# Patient Record
Sex: Female | Born: 1975
Health system: Southern US, Community
[De-identification: ages and names within clinical notes are randomized; demographics above are authoritative.]

## PROBLEM LIST (undated history)

## (undated) DIAGNOSIS — I471 Supraventricular tachycardia, unspecified: Secondary | ICD-10-CM

## (undated) DIAGNOSIS — T7840XA Allergy, unspecified, initial encounter: Secondary | ICD-10-CM

## (undated) DIAGNOSIS — J302 Other seasonal allergic rhinitis: Secondary | ICD-10-CM

## (undated) DIAGNOSIS — E782 Mixed hyperlipidemia: Secondary | ICD-10-CM

## (undated) DIAGNOSIS — I10 Essential (primary) hypertension: Secondary | ICD-10-CM

## (undated) DIAGNOSIS — D649 Anemia, unspecified: Secondary | ICD-10-CM

## (undated) DIAGNOSIS — L299 Pruritus, unspecified: Secondary | ICD-10-CM

## (undated) DIAGNOSIS — F329 Major depressive disorder, single episode, unspecified: Secondary | ICD-10-CM

## (undated) DIAGNOSIS — J45909 Unspecified asthma, uncomplicated: Secondary | ICD-10-CM

## (undated) DIAGNOSIS — O9989 Other specified diseases and conditions complicating pregnancy, childbirth and the puerperium: Secondary | ICD-10-CM

## (undated) DIAGNOSIS — E079 Disorder of thyroid, unspecified: Secondary | ICD-10-CM

## (undated) DIAGNOSIS — Z124 Encounter for screening for malignant neoplasm of cervix: Secondary | ICD-10-CM

## (undated) DIAGNOSIS — M549 Dorsalgia, unspecified: Secondary | ICD-10-CM

## (undated) DIAGNOSIS — Z803 Family history of malignant neoplasm of breast: Secondary | ICD-10-CM

## (undated) HISTORY — DX: Unspecified asthma, uncomplicated: J45.909

## (undated) HISTORY — DX: Pruritus, unspecified: L29.9

## (undated) HISTORY — DX: Family history of malignant neoplasm of breast: Z80.3

## (undated) HISTORY — DX: Allergy, unspecified, initial encounter: T78.40XA

## (undated) HISTORY — DX: Supraventricular tachycardia: I47.1

## (undated) HISTORY — DX: Encounter for screening for malignant neoplasm of cervix: Z12.4

## (undated) HISTORY — DX: Mixed hyperlipidemia: E78.2

## (undated) HISTORY — PX: ABLATION: SHX5711

## (undated) HISTORY — DX: Other specified diseases and conditions complicating pregnancy, childbirth and the puerperium: O99.89

## (undated) HISTORY — DX: Disorder of thyroid, unspecified: E07.9

## (undated) HISTORY — DX: Anemia, unspecified: D64.9

## (undated) HISTORY — DX: Dorsalgia, unspecified: M54.9

## (undated) HISTORY — DX: Supraventricular tachycardia, unspecified: I47.10

---
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
## 2011-11-22 ENCOUNTER — Encounter (HOSPITAL_COMMUNITY): Payer: Self-pay | Admitting: Physical Medicine and Rehabilitation

## 2011-11-22 ENCOUNTER — Emergency Department (HOSPITAL_COMMUNITY)
Admission: EM | Admit: 2011-11-22 | Discharge: 2011-11-22 | Disposition: A | Discharge status: Home / Self Care | Payer: BC Managed Care – PPO | Attending: Emergency Medicine | Admitting: Emergency Medicine

## 2011-11-22 DIAGNOSIS — I471 Supraventricular tachycardia, unspecified: Secondary | ICD-10-CM | POA: Insufficient documentation

## 2011-11-22 HISTORY — DX: Other seasonal allergic rhinitis: J30.2

## 2011-11-22 MED ORDER — DIPHENHYDRAMINE HCL 50 MG/ML IJ SOLN
25.0000 mg | Freq: Once | INTRAMUSCULAR | Status: AC
  Administered 2011-11-22: 25 mg via INTRAVENOUS
  Filled 2011-11-22: qty 1

## 2011-11-22 MED ORDER — ADENOSINE 6 MG/2ML IV SOLN
6.0000 mg | Freq: Once | INTRAVENOUS | Status: AC
  Administered 2011-11-22: 6 mg via INTRAVENOUS
  Filled 2011-11-22: qty 6

## 2011-11-22 MED ORDER — METHYLPREDNISOLONE SODIUM SUCC 125 MG IJ SOLR
125.0000 mg | Freq: Once | INTRAMUSCULAR | Status: AC
  Administered 2011-11-22: 125 mg via INTRAVENOUS
  Filled 2011-11-22: qty 2

## 2011-11-22 NOTE — ED Notes (Signed)
Pt presents to department for evaluation of palpitations and rapid HR. Onset last night. Denies chest pain, states generalized fatigue. Pt states "I feel like my heart is going to jump out of my chest." respirations unlabored. She is conscious alert and oriented x4.

## 2011-11-22 NOTE — ED Provider Notes (Signed)
Medical screening examination/treatment/procedure(s) were conducted as a shared visit with non-physician practitioner(s) and myself.  I personally evaluated the patient during the encounter Pt is a 36 year old woman who presented with narrow complex tachycardia.  She was treated with IV adenosine with resolution or her dysrhythmia.  Carleene Cooper III, MD 11/22/11 [phone]

## 2011-11-22 NOTE — ED Provider Notes (Signed)
7:36 AM  Date: 11/22/2011  Rate: 170  Rhythm: supraventricular tachycardia (SVT)  QRS Axis: normal  Intervals: normal  ST/T Wave abnormalities: nonspecific ST/T changes  Conduction Disutrbances:none  Narrative Interpretation: Abnormal EKG  Old EKG Reviewed: none available      Carleene Cooper III, MD 11/22/11 [phone]

## 2011-11-22 NOTE — ED Provider Notes (Signed)
History     CSN: [id number]  Arrival date & time 11/22/11  0710   First MD Initiated Contact with Patient 11/22/11 0720      Chief Complaint  Patient presents with  . Tachycardia    (Consider location/radiation/quality/duration/timing/severity/associated sxs/prior treatment) HPI  36 year old female with history of tachycardia presents complaining of rapid heart rate.  Patient acknowledge that she has history of supraventricular tachycardia. She had similar symptoms of rapid heart rate in the past requiring adenosine.  She reports essentially she felt that her heart is a much faster than usual. Denies any chest pain, lightheadedness, dizziness, or shortness of breath. Denies any precipitating factors. States that whenever she is stressed out or drink coffee rate is usually fasting normal. She normally takes the blood pressure medication which usually alleviate her symptoms.  It did not work this time.  Pt currently has no other complaints.  Past Medical History  Diagnosis Date  . Tachycardia   . Seasonal allergies     No past surgical history on file.  No family history on file.  History  Substance Use Topics  . Smoking status: Never Smoker   . Smokeless tobacco: Not on file  . Alcohol Use: No    OB History    Grav Para Term Preterm Abortions TAB SAB Ect Mult Living                  Review of Systems  All other systems reviewed and are negative.    Allergies  Amoxicillin and Penicillins  Home Medications  No current outpatient prescriptions on file.  BP 97/69  Pulse 170  Temp 97.8 F (36.6 C) (Oral)  Resp 18  SpO2 100%  Physical Exam  Nursing note and vitals reviewed. Constitutional: She is oriented to person, place, and time. She appears well-developed and well-nourished. No distress.       Awake, alert, nontoxic appearance  HENT:  Head: Atraumatic.  Eyes: Conjunctivae normal are normal. Right eye exhibits no discharge. Left eye exhibits no discharge.   Neck: Normal range of motion. Neck supple.  Cardiovascular: Regular rhythm and intact distal pulses.  Exam reveals no gallop and no friction rub.   No murmur heard.      Tachycardia noted  Pulmonary/Chest: Effort normal. No respiratory distress. She exhibits no tenderness.  Abdominal: Soft. There is no tenderness. There is no rebound.  Musculoskeletal: She exhibits no edema and no tenderness.       ROM appears intact, no obvious focal weakness  Neurological: She is alert and oriented to person, place, and time. GCS eye subscore is 4. GCS verbal subscore is 5. GCS motor subscore is 6.       Mental status and motor strength appears intact  Skin: No rash noted.  Psychiatric: She has a normal mood and affect.    ED Course  CARDIOVERSION Date/Time: 11/22/2011 8:05 AM Performed by: Fayrene Helper Authorized by: Fayrene Helper Consent: Verbal consent obtained. Risks and benefits: risks, benefits and alternatives were discussed Consent given by: patient Patient understanding: patient states understanding of the procedure being performed Patient consent: the patient's understanding of the procedure matches consent given Procedure consent: procedure consent matches procedure scheduled Patient identity confirmed: verbally with patient and arm band Time out: Immediately prior to procedure a "time out" was called to verify the correct patient, procedure, equipment, support staff and site/side marked as required. Patient sedated: no Cardioversion basis: emergent Pre-procedure rhythm: supraventricular tachycardia Patient position: patient was placed in a supine  position Chest area: chest area exposed Electrodes: pads Electrodes placed: anterior-posterior Number of attempts: 1 Post-procedure rhythm: normal sinus rhythm Complications: no complications Patient tolerance: Patient tolerated the procedure well with no immediate complications (cardioverted with adenosine 6mg  IV).   (including critical  care time)  Labs Reviewed - No data to display No results found.   No diagnosis found.  7:36 AM  Date: 11/22/2011  Rate: 170  Rhythm: supraventricular tachycardia (SVT)  QRS Axis: normal  Intervals: normal  ST/T Wave abnormalities: nonspecific ST/T changes  Conduction Disutrbances:none  Narrative Interpretation: Abnormal EKG  Old EKG Reviewed: none available   8:08 AM  Date: 11/22/2011  Rate: 102  Rhythm: sinus tachycardia  QRS Axis: normal  Intervals: normal  ST/T Wave abnormalities: normal  Conduction Disutrbances:none  Narrative Interpretation: Essentially normal EKG  Old EKG Reviewed: changes noted--Was in PSVT with rate of 170 on prior tracing.     MDM  Pt has supraventricular tachycardia with HR in 170s. Will give adenosine as treatment.  Pt report she is allergic to saline solution, with hive and rash.  I will premedicate with solumedrol 125mg , and Benadryl 25mg  via IV prior to administering the medication.  Pt aware and agrees with plan.  My attending has seen and evaluate pt.     8:08 AM Pt was successfully cardioverted with adenosine 6mg  IV.  Pt subjectively felt better.  WIll monitor for the next hour.  Pt in supine position.  A&Ox3   9:51 AM Pt has been observed for several hrs and shows no recurrence of SVT or side effect from normal saline.  She is A&Ox3.  She is ready to be d/c.  Family member at bedside.  Pt agrees to f/u with her PCP for further care.  Strict return precaution discussed.    BP 108/68  Pulse 79  Temp 97.8 F (36.6 C) (Oral)  Resp 18  SpO2 100%  Nursing notes reviewed and considered in documentation  Previous records reviewed and considered   CRITICAL CARE Performed by: Kriston Pasquarello   Total critical care time: 30 min  Critical care time was exclusive of separately billable procedures and treating other patients.  Critical care was necessary to treat or prevent imminent or life-threatening deterioration.  Critical care was  time spent personally by me on the following activities: development of treatment plan with patient and/or surrogate as well as nursing, discussions with consultants, evaluation of patient's response to treatment, examination of patient, obtaining history from patient or surrogate, ordering and performing treatments and interventions, ordering and review of laboratory studies, ordering and review of radiographic studies, pulse oximetry and re-evaluation of patient's condition.     Fayrene Helper, PA-C 11/22/11 [phone]

## 2011-11-22 NOTE — Progress Notes (Signed)
8:08 AM  Date: 11/22/2011  Rate: 102  Rhythm: sinus tachycardia  QRS Axis: normal  Intervals: normal  ST/T Wave abnormalities: normal  Conduction Disutrbances:none  Narrative Interpretation: Essentially normal EKG  Old EKG Reviewed: changes noted--Was in PSVT with rate of 170 on prior tracing.

## 2012-11-25 ENCOUNTER — Encounter (HOSPITAL_COMMUNITY): Payer: Self-pay | Admitting: Emergency Medicine

## 2012-11-25 ENCOUNTER — Emergency Department (HOSPITAL_COMMUNITY)
Admission: EM | Admit: 2012-11-25 | Discharge: 2012-11-26 | Disposition: A | Discharge status: Home / Self Care | Payer: BC Managed Care – PPO | Attending: Emergency Medicine | Admitting: Emergency Medicine

## 2012-11-25 DIAGNOSIS — Z79899 Other long term (current) drug therapy: Secondary | ICD-10-CM | POA: Insufficient documentation

## 2012-11-25 DIAGNOSIS — I498 Other specified cardiac arrhythmias: Secondary | ICD-10-CM | POA: Insufficient documentation

## 2012-11-25 DIAGNOSIS — I471 Supraventricular tachycardia: Secondary | ICD-10-CM

## 2012-11-25 DIAGNOSIS — Z8709 Personal history of other diseases of the respiratory system: Secondary | ICD-10-CM | POA: Insufficient documentation

## 2012-11-25 DIAGNOSIS — Z880 Allergy status to penicillin: Secondary | ICD-10-CM | POA: Insufficient documentation

## 2012-11-25 MED ORDER — ADENOSINE 6 MG/2ML IV SOLN
6.0000 mg | Freq: Once | INTRAVENOUS | Status: AC
  Administered 2012-11-26: 6 mg via INTRAVENOUS
  Filled 2012-11-25: qty 2

## 2012-11-25 MED ORDER — ADENOSINE 6 MG/2ML IV SOLN
12.0000 mg | Freq: Once | INTRAVENOUS | Status: AC
  Administered 2012-11-26: 12 mg via INTRAVENOUS
  Filled 2012-11-25: qty 4

## 2012-11-25 MED ORDER — DIPHENHYDRAMINE HCL 50 MG/ML IJ SOLN
12.5000 mg | Freq: Once | INTRAMUSCULAR | Status: AC
  Administered 2012-11-26: 12.5 mg via INTRAVENOUS
  Filled 2012-11-25: qty 1

## 2012-11-25 MED ORDER — METHYLPREDNISOLONE SODIUM SUCC 125 MG IJ SOLR
125.0000 mg | Freq: Once | INTRAMUSCULAR | Status: AC
  Administered 2012-11-26: 125 mg via INTRAVENOUS
  Filled 2012-11-25: qty 2

## 2012-11-25 NOTE — ED Notes (Signed)
The pt woke up at 2030 with her heart beating fast.  She has a history of the same. .  Minor chest pain and throat pain with the heart rate

## 2012-11-25 NOTE — ED Notes (Signed)
Valsalva  Maneuver not helpful

## 2012-11-26 MED ORDER — PREDNISONE 20 MG PO TABS: 40.0000 mg | ORAL_TABLET | Freq: Every day | ORAL | Status: DC

## 2012-11-26 NOTE — ED Provider Notes (Addendum)
CSN: [id number]     Arrival date & time 11/25/12  2340 History   First MD Initiated Contact with Patient 11/25/12 2347     Chief Complaint  Patient presents with  . Tachycardia   (Consider location/radiation/quality/duration/timing/severity/associated sxs/prior Treatment) HPI 37 yo female presents to the ER from home with complaint of fast heart rate.  She reports waking around 830 pm tonight with palpitations.  Pt has h/o SVT since age 4.  Last ED visit about a year ago.  She reports mild chest pressure and throat pain due to elevated heart rate.  No other complaints.  She is requesting further information about treatments for SVT, as episodes are getting closer together, last cardiology evaluation about 15 years ago.  Past Medical History  Diagnosis Date  . Tachycardia   . Seasonal allergies    History reviewed. No pertinent past surgical history. No family history on file. History  Substance Use Topics  . Smoking status: Never Smoker   . Smokeless tobacco: Not on file  . Alcohol Use: No   OB History   Grav Para Term Preterm Abortions TAB SAB Ect Mult Living                 Review of Systems  All other systems reviewed and are negative.    Allergies  Amoxicillin and Penicillins  Home Medications   Current Outpatient Rx  Name  Route  Sig  Dispense  Refill  . atenolol (TENORMIN) 50 MG tablet   Oral   Take 50 mg by mouth daily.          BP 119/91  Temp(Src) 98.2 F (36.8 C) (Oral)  Resp 16  SpO2 100%  LMP 11/06/2012 Physical Exam  Constitutional: She is oriented to person, place, and time. She appears well-developed and well-nourished. She appears distressed.  HENT:  Head: Normocephalic and atraumatic.  Nose: Nose normal.  Mouth/Throat: Oropharynx is clear and moist.  Eyes: Conjunctivae and EOM are normal. Pupils are equal, round, and reactive to light.  Neck: Normal range of motion. Neck supple. No JVD present. No tracheal deviation present. No  thyromegaly present.  Cardiovascular: Regular rhythm, normal heart sounds and intact distal pulses.  Exam reveals no gallop and no friction rub.   No murmur heard. Tachycardia noted  Pulmonary/Chest: Effort normal and breath sounds normal. No stridor. No respiratory distress. She has no wheezes. She has no rales. She exhibits no tenderness.  Abdominal: Soft. Bowel sounds are normal. She exhibits no distension and no mass. There is no tenderness. There is no rebound and no guarding.  Musculoskeletal: Normal range of motion. She exhibits no edema and no tenderness.  Lymphadenopathy:    She has no cervical adenopathy.  Neurological: She is alert and oriented to person, place, and time. She exhibits normal muscle tone. Coordination normal.  Skin: Skin is warm and dry. No rash noted. No erythema. There is pallor.  Psychiatric: She has a normal mood and affect. Her behavior is normal. Judgment and thought content normal.    ED Course  CARDIOVERSION Date/Time: 11/26/2012 12:22 AM Performed by: Olivia Mackie Authorized by: Olivia Mackie Consent: Verbal consent obtained. Risks and benefits: risks, benefits and alternatives were discussed Consent given by: patient Required items: required blood products, implants, devices, and special equipment available Time out: Immediately prior to procedure a "time out" was called to verify the correct patient, procedure, equipment, support staff and site/side marked as required. Patient sedated: no Cardioversion basis: emergent Pre-procedure rhythm:  supraventricular tachycardia Patient position: patient was placed in a supine position Electrodes: pads Electrodes placed: anterior-posterior Number of attempts: 2 Post-procedure rhythm: normal sinus rhythm Complications: no complications Comments: Pt was given adenosine 6 mg with no change in SVT.  Pt then received 12 mg adenosine with conversion to sinus tachycardia, then sinus rhythm.  Given history of  allergic reaction to saline, will observe closely for one hour.   (including critical care time) Labs Review Labs Reviewed - No data to display Imaging Review No results found. Pre conversion  Date: 11/25/2012  Rate: 176  Rhythm: supraventricular tachycardia (SVT)  QRS Axis: normal  Intervals: svt  ST/T Wave abnormalities: nonspecific ST/T changes  Conduction Disutrbances:none  Narrative Interpretation:   Old EKG Reviewed: changes noted  Post conversion  EKG Interpretation     Ventricular Rate:  100 PR Interval:  132 QRS Duration: 80 QT Interval:  339 QTC Calculation: 438 R Axis:   84 Text Interpretation:  Sinus tachycardia Borderline T abnormalities, anterior leads             MDM   1. SVT (supraventricular tachycardia)    37 yo female with h/o SVT.  Conversion with 12 mg of adenosine.  Will observe for allergic reaction to saline.    Olivia Mackie, MD 11/26/12 0034  1:17 AM Pt is feeling better.  No signs of allergic reaction.  Will refer to EP for further w/u of her svt.  Olivia Mackie, MD 11/26/12 [phone]

## 2012-11-27 ENCOUNTER — Encounter: Payer: Self-pay | Admitting: Cardiology

## 2012-11-27 ENCOUNTER — Ambulatory Visit (INDEPENDENT_AMBULATORY_CARE_PROVIDER_SITE_OTHER): Payer: BC Managed Care – PPO | Admitting: Cardiology

## 2012-11-27 VITALS — BP 114/64 | HR 68 | Ht 61.0 in | Wt 115.0 lb

## 2012-11-27 DIAGNOSIS — I498 Other specified cardiac arrhythmias: Secondary | ICD-10-CM

## 2012-11-27 DIAGNOSIS — I471 Supraventricular tachycardia: Secondary | ICD-10-CM | POA: Insufficient documentation

## 2012-11-27 MED ORDER — ATENOLOL 50 MG PO TABS: 75.0000 mg | ORAL_TABLET | Freq: Every day | ORAL | Status: DC

## 2012-11-27 NOTE — Progress Notes (Signed)
April Jackson Date of Birth: 1975/06/04 Medical Record #[id number]  History of Present Illness: April Jackson is seen for evaluation of recurrent supraventricular tachycardia. 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 She reports that she has had supraventricular tachycardia since age 2. This has been managed very well with atenolol 50 mg daily. In the past her heart rate is typically around 180 beats per minute with her tachycardia. She thinks that her symptoms are more frequent. The symptoms she describes are a sensation that her tachycardia is going to start but then doesn't. This only lasts for 2-3 seconds. She is actually had her tachycardia fairly infrequently. One year ago in October she had an episode of SVT that required adenosine to convert. She had another episode 1 week ago. Prior to these episodes it had been 8 years before she  had a sustained episode. She thinks that sometimes she can stop her tachycardia with Valsalva maneuver. She avoids caffeine. He thinks that stress and seasonal allergies are possible triggers. She does not use decongestants but does use antihistamines. When she has her tachycardia she complains of chest pain, shortness of breath, and a sensation that her throat is large. She sometimes feels a little bit faint but has never had syncope. She reports a normal echocardiogram 5 years ago. She had complete lab work with her physical earlier this year. Her primary care physician is in [place].  Current Outpatient Prescriptions on File Prior to Visit  Medication Sig Dispense Refill  . predniSONE (DELTASONE) 20 MG tablet Take 2 tablets (40 mg total) by mouth daily.  10 tablet  0   No current facility-administered medications on file prior to visit.    Allergies  Allergen Reactions  . Amoxicillin Other ([facility] Comments)    thrush  . Penicillins Other ([facility] Comments)    thrush    Past Medical History  Diagnosis Date  . SVT (supraventricular tachycardia)   . Seasonal allergies    . Thyroid disease     History reviewed. No pertinent past surgical history.  History  Smoking status  . Never Smoker   Smokeless tobacco  . Not on file    History  Alcohol Use No    Family History  Problem Relation Age of Onset  . Heart disease Father     Review of Systems: As noted in history of present illness.  All other systems were reviewed and are negative.  Physical Exam: BP 114/64  Pulse 68  Ht 5\' 1"  (1.549 m)  Wt 115 lb (52.164 kg)  BMI 21.74 kg/m2  LMP 11/06/2012 She is a pleasant,thin April Jackson female in no acute distress. HEENT: Normocephalic, atraumatic. Pupils equal round and reactive to light. Sclera are clear. Oropharynx is clear. Neck is without JVD, adenopathy, thyromegaly, or bruits. Lungs: Clear Cardiovascular: Regular rate and rhythm. Normal S1 and S2. No gallop, murmur, or click. Abdomen: Soft and nontender. No masses or hepatosplenomegaly. Extremities: No cyanosis or edema. Pulses are 2+. Skin: Warm and dry Neuro: Alert and oriented x3. LABORATORY DATA: ECG reviewed from 11/25/2012. This shows a supraventricular tachycardia with a rate of 176 beats per minute. There is nonspecific ST-T wave abnormality. No P waves are visible. I compared this with ECG and October 2013 there was no significant change. Postconversion ECG shows normal sinus rhythm with a normal ECG. There is no preexcitation.  Assessment / Plan: 1. Supraventricular tachycardia. 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 She has actually done quite  well on beta blocker therapy. Her episodes of sustained tachycardia have been fairly infrequent. I do not feel that further testing is required at this time. We discussed therapeutic options. At this point she would like to increase her beta blocker dose and we will increase her atenolol to 75 mg daily. We discussed possibly switching to a calcium channel blocker but she has done very well on beta  blocker therapy so I [place] no reason to change. At her young age I would not recommend antiarrhythmic drug therapy. We discussed the option of radiofrequency ablation. We discussed the procedure, risks, and outcomes. At this point, she does not want to pursue ablative therapy but I think would be open to this option if she has more frequent episodes of tachycardia. I will followup again in one year.

## 2013-09-12 LAB — HM PAP SMEAR: HM Pap smear: NORMAL

## 2013-09-28 LAB — HM MAMMOGRAPHY: HM Mammogram: NORMAL

## 2013-11-14 ENCOUNTER — Ambulatory Visit (INDEPENDENT_AMBULATORY_CARE_PROVIDER_SITE_OTHER): Payer: BC Managed Care – PPO | Admitting: Cardiology

## 2013-11-14 ENCOUNTER — Encounter: Payer: Self-pay | Admitting: Cardiology

## 2013-11-14 VITALS — BP 126/74 | HR 57 | Ht 61.0 in | Wt 125.0 lb

## 2013-11-14 DIAGNOSIS — I471 Supraventricular tachycardia: Secondary | ICD-10-CM

## 2013-11-14 MED ORDER — ATENOLOL 50 MG PO TABS: 75.0000 mg | ORAL_TABLET | Freq: Every day | ORAL | Status: DC

## 2013-11-14 NOTE — Patient Instructions (Signed)
Continue your current therapy  I will Melone you in one year   

## 2013-11-15 NOTE — Progress Notes (Signed)
   April Jackson Date of Birth: Mar 19, 1975 Medical Record #[id number]  History of Present Illness: Ms. Toutant is seen for follow up supraventricular tachycardia. She reports that she has had supraventricular tachycardia since age 38. This has been managed very well with atenolol. In the past her heart rate is typically around 180 beats per minute with her tachycardia.  has never had syncope. She reports a normal echocardiogram 5 years ago.On her last visit she was having more frequent symptoms so we increased her atenolol to 75 mg daily with good control. She states now she can tell when it is about to start and she takes an extra dose of beta blocker and it doesn't get started. She does this 1-2 x/month. She does note some fatigue on beta blocker and a little SOB.  Current Outpatient Prescriptions on File Prior to Visit  Medication Sig Dispense Refill  . Loratadine (CLARITIN) 10 MG CAPS Take 1 capsule by mouth daily.      . Multiple Vitamin (MULTI VITAMIN DAILY PO) Take 1 tablet by mouth daily.       No current facility-administered medications on file prior to visit.    Allergies  Allergen Reactions  . Amoxicillin Other ([facility] Comments)    thrush  . Penicillin V Potassium     Other reaction(s): Unknown  . Penicillins Other ([facility] Comments)    thrush    Past Medical History  Diagnosis Date  . SVT (supraventricular tachycardia)   . Seasonal allergies   . Thyroid disease     History reviewed. No pertinent past surgical history.  History  Smoking status  . Never Smoker   Smokeless tobacco  . Not on file    History  Alcohol Use No    Family History  Problem Relation Age of Onset  . Heart disease Father     Review of Systems: As noted in history of present illness.  All other systems were reviewed and are negative.  Physical Exam: BP 126/74  Pulse 57  Ht 5' 1"  (1.549 m)  Wt 125 lb (56.7 kg)  BMI 23.63 kg/m2 She is a pleasant,thin [place] female in no acute  distress. HEENT: Normocephalic, atraumatic. Pupils equal round and reactive to light. Sclera are clear. Oropharynx is clear. Neck is without JVD, adenopathy, thyromegaly, or bruits. Lungs: Clear Cardiovascular: Regular rate and rhythm. Normal S1 and S2. No gallop, murmur, or click. Abdomen: Soft and nontender. No masses or hepatosplenomegaly. Extremities: No cyanosis or edema. Pulses are 2+. Skin: Warm and dry Neuro: Alert and oriented x3.  LABORATORY DATA: Ecg: NSR normal   Assessment / Plan: 1. Supraventricular tachycardia. 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 She has actually done quite well on beta blocker therapy. She does have some side effects from beta blocker therapy. She states this is tolerable. She does not want to consider changing to a calcium channel blocker or considering ablation at this time. We have refilled her Rx and will follow up in one year.

## 2014-09-17 ENCOUNTER — Telehealth: Payer: Self-pay | Admitting: *Deleted

## 2014-09-17 ENCOUNTER — Encounter: Payer: Self-pay | Admitting: *Deleted

## 2014-09-17 DIAGNOSIS — Z1239 Encounter for other screening for malignant neoplasm of breast: Secondary | ICD-10-CM

## 2014-09-17 NOTE — Telephone Encounter (Signed)
Pre-Visit Call completed with patient and chart updated.   Pre-Visit Info documented in Specialty Comments under [employer].    

## 2014-09-17 NOTE — Addendum Note (Signed)
Addended by: Leticia Penna A on: 09/17/2014 02:11 PM   Modules accepted: Orders, Medications

## 2014-09-17 NOTE — Telephone Encounter (Signed)
Unable to reach patient at time of Pre-Visit Call.  Left message for patient to return call when available.    

## 2014-09-18 ENCOUNTER — Ambulatory Visit (INDEPENDENT_AMBULATORY_CARE_PROVIDER_SITE_OTHER): Payer: BLUE CROSS/BLUE SHIELD | Admitting: Family Medicine

## 2014-09-18 ENCOUNTER — Encounter: Payer: Self-pay | Admitting: Family Medicine

## 2014-09-18 VITALS — BP 98/68 | HR 73 | Temp 98.6°F | Ht 61.0 in | Wt 132.2 lb

## 2014-09-18 DIAGNOSIS — Z803 Family history of malignant neoplasm of breast: Secondary | ICD-10-CM

## 2014-09-18 DIAGNOSIS — Z1239 Encounter for other screening for malignant neoplasm of breast: Secondary | ICD-10-CM

## 2014-09-18 DIAGNOSIS — L299 Pruritus, unspecified: Secondary | ICD-10-CM | POA: Diagnosis not present

## 2014-09-18 DIAGNOSIS — Z0000 Encounter for general adult medical examination without abnormal findings: Secondary | ICD-10-CM

## 2014-09-18 DIAGNOSIS — E782 Mixed hyperlipidemia: Secondary | ICD-10-CM

## 2014-09-18 DIAGNOSIS — E079 Disorder of thyroid, unspecified: Secondary | ICD-10-CM | POA: Insufficient documentation

## 2014-09-18 DIAGNOSIS — T7840XA Allergy, unspecified, initial encounter: Secondary | ICD-10-CM | POA: Insufficient documentation

## 2014-09-18 DIAGNOSIS — M5489 Other dorsalgia: Secondary | ICD-10-CM

## 2014-09-18 DIAGNOSIS — I471 Supraventricular tachycardia: Secondary | ICD-10-CM

## 2014-09-18 MED ORDER — TRIAMCINOLONE ACETONIDE 0.1 % EX CREA: 1.0000 "application " | TOPICAL_CREAM | Freq: Two times a day (BID) | CUTANEOUS | Status: DC

## 2014-09-18 NOTE — Progress Notes (Signed)
Pre visit review using our clinic review tool, if applicable. No additional management support is needed unless otherwise documented below in the visit note. 

## 2014-09-18 NOTE — Addendum Note (Signed)
Addended by: Leticia Penna A on: 09/18/2014 02:43 PM   Modules accepted: Orders

## 2014-09-18 NOTE — Patient Instructions (Addendum)
[place] Astringent,  Zyrtec 2.5-10 mg  Antihistamines (1) Zantac 150 mg daily  Antihistamines (2)  Preventive Care for Adults A healthy lifestyle and preventive care can promote health and wellness. Preventive health guidelines for women include the following key practices.  A routine yearly physical is a good way to check with your health care provider about your health and preventive screening. It is a chance to share any concerns and updates on your health and to receive a thorough exam.  Visit your dentist for a routine exam and preventive care every 6 months. Brush your teeth twice a day and floss once a day. Good oral hygiene prevents tooth decay and gum disease.  The frequency of eye exams is based on your age, health, family medical history, use of contact lenses, and other factors. Follow your health care provider's recommendations for frequency of eye exams.  Eat a healthy diet. Foods like vegetables, fruits, whole grains, low-fat dairy products, and lean protein foods contain the nutrients you need without too many calories. Decrease your intake of foods high in solid fats, added sugars, and salt. Eat the right amount of calories for you.Get information about a proper diet from your health care provider, if necessary.  Regular physical exercise is one of the most important things you can do for your health. Most adults should get at least 150 minutes of moderate-intensity exercise (any activity that increases your heart rate and causes you to sweat) each week. In addition, most adults need muscle-strengthening exercises on 2 or more days a week.  Maintain a healthy weight. The body mass index (BMI) is a screening tool to identify possible weight problems. It provides an estimate of body fat based on height and weight. Your health care provider can find your BMI and can help you achieve or maintain a healthy weight.For adults 20 years and older:  A BMI below 18.5 is considered  underweight.  A BMI of 18.5 to 24.9 is normal.  A BMI of 25 to 29.9 is considered overweight.  A BMI of 30 and above is considered obese.  Maintain normal blood lipids and cholesterol levels by exercising and minimizing your intake of saturated fat. Eat a balanced diet with plenty of fruit and vegetables. Blood tests for lipids and cholesterol should begin at age 79 and be repeated every 5 years. If your lipid or cholesterol levels are high, you are over 50, or you are at high risk for heart disease, you may need your cholesterol levels checked more frequently.Ongoing high lipid and cholesterol levels should be treated with medicines if diet and exercise are not working.  If you smoke, find out from your health care provider how to quit. If you do not use tobacco, do not start.  Lung cancer screening is recommended for adults aged 33-80 years who are at high risk for developing lung cancer because of a history of smoking. A yearly low-dose CT scan of the lungs is recommended for people who have at least a 30-pack-year history of smoking and are a current smoker or have quit within the past 15 years. A pack year of smoking is smoking an average of 1 pack of cigarettes a day for 1 year (for example: 1 pack a day for 30 years or 2 packs a day for 15 years). Yearly screening should continue until the smoker has stopped smoking for at least 15 years. Yearly screening should be stopped for people who develop a health problem that would prevent them  from having lung cancer treatment.  If you are pregnant, do not drink alcohol. If you are breastfeeding, be very cautious about drinking alcohol. If you are not pregnant and choose to drink alcohol, do not have more than 1 drink per day. One drink is considered to be 12 ounces (355 mL) of beer, 5 ounces (148 mL) of wine, or 1.5 ounces (44 mL) of liquor.  Avoid use of street drugs. Do not share needles with anyone. Ask for help if you need support or  instructions about stopping the use of drugs.  High blood pressure causes heart disease and increases the risk of stroke. Your blood pressure should be checked at least every 1 to 2 years. Ongoing high blood pressure should be treated with medicines if weight loss and exercise do not work.  If you are 19-70 years old, ask your health care provider if you should take aspirin to prevent strokes.  Diabetes screening involves taking a blood sample to check your fasting blood sugar level. This should be done once every 3 years, after age 21, if you are within normal weight and without risk factors for diabetes. Testing should be considered at a younger age or be carried out more frequently if you are overweight and have at least 1 risk factor for diabetes.  Breast cancer screening is essential preventive care for women. You should practice "breast self-awareness." This means understanding the normal appearance and feel of your breasts and may include breast self-examination. Any changes detected, no matter how small, should be reported to a health care provider. Women in their 83s and 30s should have a clinical breast exam (CBE) by a health care provider as part of a regular health exam every 1 to 3 years. After age 49, women should have a CBE every year. Starting at age 13, women should consider having a mammogram (breast X-ray test) every year. Women who have a family history of breast cancer should talk to their health care provider about genetic screening. Women at a high risk of breast cancer should talk to their health care providers about having an MRI and a mammogram every year.  Breast cancer gene (BRCA)-related cancer risk assessment is recommended for women who have family members with BRCA-related cancers. BRCA-related cancers include breast, ovarian, tubal, and peritoneal cancers. Having family members with these cancers may be associated with an increased risk for harmful changes (mutations) in  the breast cancer genes BRCA1 and BRCA2. Results of the assessment will determine the need for genetic counseling and BRCA1 and BRCA2 testing.  Routine pelvic exams to screen for cancer are no longer recommended for nonpregnant women who are considered low risk for cancer of the pelvic organs (ovaries, uterus, and vagina) and who do not have symptoms. Ask your health care provider if a screening pelvic exam is right for you.  If you have had past treatment for cervical cancer or a condition that could lead to cancer, you need Pap tests and screening for cancer for at least 20 years after your treatment. If Pap tests have been discontinued, your risk factors (such as having a new sexual partner) need to be reassessed to determine if screening should be resumed. Some women have medical problems that increase the chance of getting cervical cancer. In these cases, your health care provider may recommend more frequent screening and Pap tests.  The HPV test is an additional test that may be used for cervical cancer screening. The HPV test looks for the virus  that can cause the cell changes on the cervix. The cells collected during the Pap test can be tested for HPV. The HPV test could be used to screen women aged 15 years and older, and should be used in women of any age who have unclear Pap test results. After the age of 44, women should have HPV testing at the same frequency as a Pap test.  Colorectal cancer can be detected and often prevented. Most routine colorectal cancer screening begins at the age of 19 years and continues through age 38 years. However, your health care provider may recommend screening at an earlier age if you have risk factors for colon cancer. On a yearly basis, your health care provider may provide home test kits to check for hidden blood in the stool. Use of a small camera at the end of a tube, to directly examine the colon (sigmoidoscopy or colonoscopy), can detect the earliest forms  of colorectal cancer. Talk to your health care provider about this at age 30, when routine screening begins. Direct exam of the colon should be repeated every 5-10 years through age 53 years, unless early forms of pre-cancerous polyps or small growths are found.  People who are at an increased risk for hepatitis B should be screened for this virus. You are considered at high risk for hepatitis B if:  You were born in a country where hepatitis B occurs often. Talk with your health care provider about which countries are considered high risk.  Your parents were born in a high-risk country and you have not received a shot to protect against hepatitis B (hepatitis B vaccine).  You have HIV or AIDS.  You use needles to inject street drugs.  You live with, or have sex with, someone who has hepatitis B.  You get hemodialysis treatment.  You take certain medicines for conditions like cancer, organ transplantation, and autoimmune conditions.  Hepatitis C blood testing is recommended for all people born from [birth year] through 1965 and any individual with known risks for hepatitis C.  Practice safe sex. Use condoms and avoid high-risk sexual practices to reduce the spread of sexually transmitted infections (STIs). STIs include gonorrhea, chlamydia, syphilis, trichomonas, herpes, HPV, and human immunodeficiency virus (HIV). Herpes, HIV, and HPV are viral illnesses that have no cure. They can result in disability, cancer, and death.  You should be screened for sexually transmitted illnesses (STIs) including gonorrhea and chlamydia if:  You are sexually active and are younger than 24 years.  You are older than 24 years and your health care provider tells you that you are at risk for this type of infection.  Your sexual activity has changed since you were last screened and you are at an increased risk for chlamydia or gonorrhea. Ask your health care provider if you are at risk.  If you are at risk of  being infected with HIV, it is recommended that you take a prescription medicine daily to prevent HIV infection. This is called preexposure prophylaxis (PrEP). You are considered at risk if:  You are a heterosexual woman, are sexually active, and are at increased risk for HIV infection.  You take drugs by injection.  You are sexually active with a partner who has HIV.  Talk with your health care provider about whether you are at high risk of being infected with HIV. If you choose to begin PrEP, you should first be tested for HIV. You should then be tested every 3 months for as long  as you are taking PrEP.  Osteoporosis is a disease in which the bones lose minerals and strength with aging. This can result in serious bone fractures or breaks. The risk of osteoporosis can be identified using a bone density scan. Women ages 57 years and over and women at risk for fractures or osteoporosis should discuss screening with their health care providers. Ask your health care provider whether you should take a calcium supplement or vitamin D to reduce the rate of osteoporosis.  Menopause can be associated with physical symptoms and risks. Hormone replacement therapy is available to decrease symptoms and risks. You should talk to your health care provider about whether hormone replacement therapy is right for you.  Use sunscreen. Apply sunscreen liberally and repeatedly throughout the day. You should seek shade when your shadow is shorter than you. Protect yourself by wearing long sleeves, pants, a wide-brimmed hat, and sunglasses year round, whenever you are outdoors.  Once a month, do a whole body skin exam, using a mirror to look at the skin on your back. Tell your health care provider of new moles, moles that have irregular borders, moles that are larger than a pencil eraser, or moles that have changed in shape or color.  Stay current with required vaccines (immunizations).  Influenza vaccine. All adults  should be immunized every year.  Tetanus, diphtheria, and acellular pertussis (Td, Tdap) vaccine. Pregnant women should receive 1 dose of Tdap vaccine during each pregnancy. The dose should be obtained regardless of the length of time since the last dose. Immunization is preferred during the 27th-36th week of gestation. An adult who has not previously received Tdap or who does not know her vaccine status should receive 1 dose of Tdap. This initial dose should be followed by tetanus and diphtheria toxoids (Td) booster doses every 10 years. Adults with an unknown or incomplete history of completing a 3-dose immunization series with Td-containing vaccines should begin or complete a primary immunization series including a Tdap dose. Adults should receive a Td booster every 10 years.  Varicella vaccine. An adult without evidence of immunity to varicella should receive 2 doses or a second dose if she has previously received 1 dose. Pregnant females who do not have evidence of immunity should receive the first dose after pregnancy. This first dose should be obtained before leaving the health care facility. The second dose should be obtained 4-8 weeks after the first dose.  Human papillomavirus (HPV) vaccine. Females aged 13-26 years who have not received the vaccine previously should obtain the 3-dose series. The vaccine is not recommended for use in pregnant females. However, pregnancy testing is not needed before receiving a dose. If a female is found to be pregnant after receiving a dose, no treatment is needed. In that case, the remaining doses should be delayed until after the pregnancy. Immunization is recommended for any person with an immunocompromised condition through the age of 63 years if she did not get any or all doses earlier. During the 3-dose series, the second dose should be obtained 4-8 weeks after the first dose. The third dose should be obtained 24 weeks after the first dose and 16 weeks after  the second dose.  Zoster vaccine. One dose is recommended for adults aged 29 years or older unless certain conditions are present.  Measles, mumps, and rubella (MMR) vaccine. Adults born before [birth year] generally are considered immune to measles and mumps. Adults born in [birth year] or later should have 1 or more doses of  MMR vaccine unless there is a contraindication to the vaccine or there is laboratory evidence of immunity to each of the three diseases. A routine second dose of MMR vaccine should be obtained at least 28 days after the first dose for students attending postsecondary schools, health care workers, or international travelers. People who received inactivated measles vaccine or an unknown type of measles vaccine during 1963-1967 should receive 2 doses of MMR vaccine. People who received inactivated mumps vaccine or an unknown type of mumps vaccine before 1979 and are at high risk for mumps infection should consider immunization with 2 doses of MMR vaccine. For females of childbearing age, rubella immunity should be determined. If there is no evidence of immunity, females who are not pregnant should be vaccinated. If there is no evidence of immunity, females who are pregnant should delay immunization until after pregnancy. Unvaccinated health care workers born before [birth year] who lack laboratory evidence of measles, mumps, or rubella immunity or laboratory confirmation of disease should consider measles and mumps immunization with 2 doses of MMR vaccine or rubella immunization with 1 dose of MMR vaccine.  Pneumococcal 13-valent conjugate (PCV13) vaccine. When indicated, a person who is uncertain of her immunization history and has no record of immunization should receive the PCV13 vaccine. An adult aged 40 years or older who has certain medical conditions and has not been previously immunized should receive 1 dose of PCV13 vaccine. This PCV13 should be followed with a dose of pneumococcal polysaccharide (PPSV23)  vaccine. The PPSV23 vaccine dose should be obtained at least 8 weeks after the dose of PCV13 vaccine. An adult aged 19 years or older who has certain medical conditions and previously received 1 or more doses of PPSV23 vaccine should receive 1 dose of PCV13. The PCV13 vaccine dose should be obtained 1 or more years after the last PPSV23 vaccine dose.  Pneumococcal polysaccharide (PPSV23) vaccine. When PCV13 is also indicated, PCV13 should be obtained first. All adults aged 63 years and older should be immunized. An adult younger than age 10 years who has certain medical conditions should be immunized. Any person who resides in a nursing home or long-term care facility should be immunized. An adult smoker should be immunized. People with an immunocompromised condition and certain other conditions should receive both PCV13 and PPSV23 vaccines. People with human immunodeficiency virus (HIV) infection should be immunized as soon as possible after diagnosis. Immunization during chemotherapy or radiation therapy should be avoided. Routine use of PPSV23 vaccine is not recommended for American Indians, [place] Natives, or people younger than 65 years unless there are medical conditions that require PPSV23 vaccine. When indicated, people who have unknown immunization and have no record of immunization should receive PPSV23 vaccine. One-time revaccination 5 years after the first dose of PPSV23 is recommended for people aged 19-64 years who have chronic kidney failure, nephrotic syndrome, asplenia, or immunocompromised conditions. People who received 1-2 doses of PPSV23 before age 17 years should receive another dose of PPSV23 vaccine at age 24 years or later if at least 5 years have passed since the previous dose. Doses of PPSV23 are not needed for people immunized with PPSV23 at or after age 24 years.  Meningococcal vaccine. Adults with asplenia or persistent complement component deficiencies should receive 2 doses of  quadrivalent meningococcal conjugate (MenACWY-D) vaccine. The doses should be obtained at least 2 months apart. Microbiologists working with certain meningococcal bacteria, [place] recruits, people at risk during an outbreak, and people who travel to or live  in countries with a high rate of meningitis should be immunized. A first-year college student up through age 37 years who is living in a residence hall should receive a dose if she did not receive a dose on or after her 16th birthday. Adults who have certain high-risk conditions should receive one or more doses of vaccine.  Hepatitis A vaccine. Adults who wish to be protected from this disease, have certain high-risk conditions, work with hepatitis A-infected animals, work in hepatitis A research labs, or travel to or work in countries with a high rate of hepatitis A should be immunized. Adults who were previously unvaccinated and who anticipate close contact with an international adoptee during the first 60 days after arrival in the [place] from a country with a high rate of hepatitis A should be immunized.  Hepatitis B vaccine. Adults who wish to be protected from this disease, have certain high-risk conditions, may be exposed to blood or other infectious body fluids, are household contacts or sex partners of hepatitis B positive people, are clients or workers in certain care facilities, or travel to or work in countries with a high rate of hepatitis B should be immunized.  Haemophilus influenzae type b (Hib) vaccine. A previously unvaccinated person with asplenia or sickle cell disease or having a scheduled splenectomy should receive 1 dose of Hib vaccine. Regardless of previous immunization, a recipient of a hematopoietic stem cell transplant should receive a 3-dose series 6-12 months after her successful transplant. Hib vaccine is not recommended for adults with HIV infection. Preventive Services / Frequency Ages 88 to 48 years  Blood  pressure check.** / Every 1 to 2 years.  Lipid and cholesterol check.** / Every 5 years beginning at age 56.  Clinical breast exam.** / Every 3 years for women in their 51s and 5s.  BRCA-related cancer risk assessment.** / For women who have family members with a BRCA-related cancer (breast, ovarian, tubal, or peritoneal cancers).  Pap test.** / Every 2 years from ages 45 through 71. Every 3 years starting at age 77 through age 45 or 71 with a history of 3 consecutive normal Pap tests.  HPV screening.** / Every 3 years from ages 35 through ages 59 to 29 with a history of 3 consecutive normal Pap tests.  Hepatitis C blood test.** / For any individual with known risks for hepatitis C.  Skin self-exam. / Monthly.  Influenza vaccine. / Every year.  Tetanus, diphtheria, and acellular pertussis (Tdap, Td) vaccine.** / Consult your health care provider. Pregnant women should receive 1 dose of Tdap vaccine during each pregnancy. 1 dose of Td every 10 years.  Varicella vaccine.** / Consult your health care provider. Pregnant females who do not have evidence of immunity should receive the first dose after pregnancy.  HPV vaccine. / 3 doses over 6 months, if 32 and younger. The vaccine is not recommended for use in pregnant females. However, pregnancy testing is not needed before receiving a dose.  Measles, mumps, rubella (MMR) vaccine.** / You need at least 1 dose of MMR if you were born in 1957 or later. You may also need a 2nd dose. For females of childbearing age, rubella immunity should be determined. If there is no evidence of immunity, females who are not pregnant should be vaccinated. If there is no evidence of immunity, females who are pregnant should delay immunization until after pregnancy.  Pneumococcal 13-valent conjugate (PCV13) vaccine.** / Consult your health care provider.  Pneumococcal polysaccharide (PPSV23) vaccine.** /  1 to 2 doses if you smoke cigarettes or if you have certain  conditions.  Meningococcal vaccine.** / 1 dose if you are age 80 to 63 years and a Market researcher living in a residence hall, or have one of several medical conditions, you need to get vaccinated against meningococcal disease. You may also need additional booster doses.  Hepatitis A vaccine.** / Consult your health care provider.  Hepatitis B vaccine.** / Consult your health care provider.  Haemophilus influenzae type b (Hib) vaccine.** / Consult your health care provider. Ages 77 to 92 years  Blood pressure check.** / Every 1 to 2 years.  Lipid and cholesterol check.** / Every 5 years beginning at age 65 years.  Lung cancer screening. / Every year if you are aged 50-80 years and have a 30-pack-year history of smoking and currently smoke or have quit within the past 15 years. Yearly screening is stopped once you have quit smoking for at least 15 years or develop a health problem that would prevent you from having lung cancer treatment.  Clinical breast exam.** / Every year after age 2 years.  BRCA-related cancer risk assessment.** / For women who have family members with a BRCA-related cancer (breast, ovarian, tubal, or peritoneal cancers).  Mammogram.** / Every year beginning at age 85 years and continuing for as long as you are in good health. Consult with your health care provider.  Pap test.** / Every 3 years starting at age 55 years through age 41 or 79 years with a history of 3 consecutive normal Pap tests.  HPV screening.** / Every 3 years from ages 75 years through ages 53 to 77 years with a history of 3 consecutive normal Pap tests.  Fecal occult blood test (FOBT) of stool. / Every year beginning at age 59 years and continuing until age 70 years. You may not need to do this test if you get a colonoscopy every 10 years.  Flexible sigmoidoscopy or colonoscopy.** / Every 5 years for a flexible sigmoidoscopy or every 10 years for a colonoscopy beginning at age 97 years  and continuing until age 67 years.  Hepatitis C blood test.** / For all people born from [birth year] through 1965 and any individual with known risks for hepatitis C.  Skin self-exam. / Monthly.  Influenza vaccine. / Every year.  Tetanus, diphtheria, and acellular pertussis (Tdap/Td) vaccine.** / Consult your health care provider. Pregnant women should receive 1 dose of Tdap vaccine during each pregnancy. 1 dose of Td every 10 years.  Varicella vaccine.** / Consult your health care provider. Pregnant females who do not have evidence of immunity should receive the first dose after pregnancy.  Zoster vaccine.** / 1 dose for adults aged 72 years or older.  Measles, mumps, rubella (MMR) vaccine.** / You need at least 1 dose of MMR if you were born in 1957 or later. You may also need a 2nd dose. For females of childbearing age, rubella immunity should be determined. If there is no evidence of immunity, females who are not pregnant should be vaccinated. If there is no evidence of immunity, females who are pregnant should delay immunization until after pregnancy.  Pneumococcal 13-valent conjugate (PCV13) vaccine.** / Consult your health care provider.  Pneumococcal polysaccharide (PPSV23) vaccine.** / 1 to 2 doses if you smoke cigarettes or if you have certain conditions.  Meningococcal vaccine.** / Consult your health care provider.  Hepatitis A vaccine.** / Consult your health care provider.  Hepatitis B vaccine.** / Consult your  health care provider.  Haemophilus influenzae type b (Hib) vaccine.** / Consult your health care provider. Ages 75 years and over  Blood pressure check.** / Every 1 to 2 years.  Lipid and cholesterol check.** / Every 5 years beginning at age 45 years.  Lung cancer screening. / Every year if you are aged 66-80 years and have a 30-pack-year history of smoking and currently smoke or have quit within the past 15 years. Yearly screening is stopped once you have quit smoking  for at least 15 years or develop a health problem that would prevent you from having lung cancer treatment.  Clinical breast exam.** / Every year after age 42 years.  BRCA-related cancer risk assessment.** / For women who have family members with a BRCA-related cancer (breast, ovarian, tubal, or peritoneal cancers).  Mammogram.** / Every year beginning at age 68 years and continuing for as long as you are in good health. Consult with your health care provider.  Pap test.** / Every 3 years starting at age 4 years through age 69 or 75 years with 3 consecutive normal Pap tests. Testing can be stopped between 65 and 70 years with 3 consecutive normal Pap tests and no abnormal Pap or HPV tests in the past 10 years.  HPV screening.** / Every 3 years from ages 23 years through ages 67 or 65 years with a history of 3 consecutive normal Pap tests. Testing can be stopped between 65 and 70 years with 3 consecutive normal Pap tests and no abnormal Pap or HPV tests in the past 10 years.  Fecal occult blood test (FOBT) of stool. / Every year beginning at age 11 years and continuing until age 74 years. You may not need to do this test if you get a colonoscopy every 10 years.  Flexible sigmoidoscopy or colonoscopy.** / Every 5 years for a flexible sigmoidoscopy or every 10 years for a colonoscopy beginning at age 68 years and continuing until age 33 years.  Hepatitis C blood test.** / For all people born from [birth year] through 1967 and any individual with known risks for hepatitis C.  Osteoporosis screening.** / A one-time screening for women ages 67 years and over and women at risk for fractures or osteoporosis.  Skin self-exam. / Monthly.  Influenza vaccine. / Every year.  Tetanus, diphtheria, and acellular pertussis (Tdap/Td) vaccine.** / 1 dose of Td every 10 years.  Varicella vaccine.** / Consult your health care provider.  Zoster vaccine.** / 1 dose for adults aged 96 years or older.  Pneumococcal  13-valent conjugate (PCV13) vaccine.** / Consult your health care provider.  Pneumococcal polysaccharide (PPSV23) vaccine.** / 1 dose for all adults aged 68 years and older.  Meningococcal vaccine.** / Consult your health care provider.  Hepatitis A vaccine.** / Consult your health care provider.  Hepatitis B vaccine.** / Consult your health care provider.  Haemophilus influenzae type b (Hib) vaccine.** / Consult your health care provider. ** Family history and personal history of risk and conditions may change your health care provider's recommendations. Document Released: 03/29/2001 Document Revised: 06/17/2013 Document Reviewed: 06/28/2010 [facility] Patient Information 2015 [place], [place]. This information is not intended to replace advice given to you by your health care provider. Make sure you discuss any questions you have with your health care provider.

## 2014-09-18 NOTE — Progress Notes (Signed)
April Jackson  [id number] 03-21-1975 09/18/2014      Progress Note-Follow Up  Subjective  Chief Complaint  Chief Complaint  Patient presents with  . Establish Care    HPI  Patient is a 39 y.o. female in today for routine medical care. Patient is in for new patient appointment. English is not her first language but she tells well. She is here today with her sister. She complains of intermittent pruritus but no rash. She requests a prescription of levothyroxine to use as needed for fatigue, she reports she has done this in the past. Their sister carries the diagnosis of breast cancer. No recent illness. Denies CP/palp/SOB/HA/congestion/fevers/GI or GU c/o. Taking meds as prescribed  Past Medical History  Diagnosis Date  . SVT (supraventricular tachycardia)   . Seasonal allergies   . Thyroid disease     History reviewed. No pertinent past surgical history.  Family History  Problem Relation Age of Onset  . Heart disease Father   . Cancer Sister 30    breast     History   Social History  . Marital Status: Single    Spouse Name: N/A  . Number of Children: 0  . Years of Education: N/A   Occupational History  . DENTIST    Social History Main Topics  . Smoking status: Never Smoker   . Smokeless tobacco: Not on file  . Alcohol Use: No  . Drug Use: No  . Sexual Activity: Not on file   Other Topics Concern  . Not on file   Social History Narrative    Current Outpatient Prescriptions on File Prior to Visit  Medication Sig Dispense Refill  . atenolol (TENORMIN) 50 MG tablet Take 1.5 tablets (75 mg total) by mouth daily. 135 tablet 3  . cetirizine (ZYRTEC) 10 MG tablet Take 10 mg by mouth daily.    . cyclobenzaprine (FLEXERIL) 10 MG tablet Take 10 mg by mouth 3 (three) times daily as needed for muscle spasms.    . Loratadine (CLARITIN) 10 MG CAPS Take 1 capsule by mouth daily.    . Multiple Vitamin (MULTI VITAMIN DAILY PO) Take 1 tablet by mouth daily.     No current  facility-administered medications on file prior to visit.    Allergies  Allergen Reactions  . Amoxicillin Other ([place] Comments)    thrush  . Penicillin V Potassium     Other reaction(s): Unknown  . Penicillins Other ([place] Comments)    thrush   Allergies verified: UTD   Immunization Status: Flu vaccine-- NA  Tdap-- 4-5- years ago states will bring records  PNA-- NA  Shingles-- NA   A/P:  Changes to FH, PSH or Personal Hx: Patient history of hypothyroidism, SVT- currently controlled on atenolol but has been told she may have to have surgery, sister history of breast ca  Pap-- 7/ 2015 per patient normal, would like to schedule with Dr. B at another time  MMG-- 7/ 2015 per patient, normal, would like repeat (ordered)  Bone Density-- NA  CCS-- NA   Care Teams Updated: No other providers yet ( recently moved)  ED/Hospital/Urgent Care Visits: none recent   Review of Systems  Review of Systems  Constitutional: Negative for fever, chills and malaise/fatigue.  HENT: Negative for congestion, hearing loss and nosebleeds.   Eyes: Negative for discharge.  Respiratory: Negative for cough, sputum production, shortness of breath and wheezing.   Cardiovascular: Negative for chest pain, palpitations and leg swelling.  Gastrointestinal: Negative for heartburn,  nausea, vomiting, abdominal pain, diarrhea, constipation and blood in stool.  Genitourinary: Negative for dysuria, urgency, frequency and hematuria.  Musculoskeletal: Positive for back pain. Negative for myalgias and falls.  Skin: Positive for itching. Negative for rash.  Neurological: Negative for dizziness, tremors, sensory change, focal weakness, loss of consciousness, weakness and headaches.  Endo/Heme/Allergies: Negative for polydipsia. Does not bruise/bleed easily.  Psychiatric/Behavioral: Negative for depression and suicidal ideas. The patient is not nervous/anxious and does not have insomnia.     Objective  BP 98/68 mmHg   Pulse 73  Temp(Src) 98.6 F (37 C) (Oral)  Ht 5' 1"  (1.549 m)  Wt 132 lb 4 oz (59.988 kg)  BMI 25.00 kg/m2  SpO2 97%  LMP 08/29/2014  Physical Exam  Physical Exam  Constitutional: She is oriented to person, place, and time and well-developed, well-nourished, and in no distress. No distress.  HENT:  Head: Normocephalic and atraumatic.  Right Ear: External ear normal.  Left Ear: External ear normal.  Nose: Nose normal.  Mouth/Throat: Oropharynx is clear and moist. No oropharyngeal exudate.  Eyes: Conjunctivae are normal. Pupils are equal, round, and reactive to light. Right eye exhibits no discharge. Left eye exhibits no discharge. No scleral icterus.  Neck: Normal range of motion. Neck supple. No thyromegaly present.  Cardiovascular: Normal rate, regular rhythm, normal heart sounds and intact distal pulses.   No murmur heard. Pulmonary/Chest: Effort normal and breath sounds normal. No respiratory distress. She has no wheezes. She has no rales.  Abdominal: Soft. Bowel sounds are normal. She exhibits no distension and no mass. There is no tenderness.  Musculoskeletal: Normal range of motion. She exhibits no edema or tenderness.  Lymphadenopathy:    She has no cervical adenopathy.  Neurological: She is alert and oriented to person, place, and time. She has normal reflexes. No cranial nerve deficit. Coordination normal.  Skin: Skin is warm and dry. No rash noted. She is not diaphoretic.  Psychiatric: Mood, memory and affect normal.     Assessment & Plan  Thyroid disease Thyroid studies wnl. Patient is requesting a prescription of Levothyroxine to use as need for fatigue will hold off for now.   Hyperlipidemia, mixed Encouraged heart healthy diet, increase exercise, avoid trans fats, consider a krill oil cap daily  SVT (supraventricular tachycardia) Well controlled on current dose of Tenormin  Back pain Encouraged moist heat and gentle stretching as tolerated. May try NSAIDs  and prescription meds as directed and report if symptoms worsen or seek immediate care. Flexeril prn  Pruritus Unclear etiology, encouraged daily Zyrtec and Zantac and referred to dermatology for consideration  FH: breast cancer Patient is considering genetic testing

## 2014-09-19 LAB — COMPREHENSIVE METABOLIC PANEL
ALT: 9 U/L (ref 0–35)
AST: 16 U/L (ref 0–37)
Albumin: 4.5 g/dL (ref 3.5–5.2)
Alkaline Phosphatase: 40 U/L (ref 39–117)
BUN: 12 mg/dL (ref 6–23)
CO2: 26 mEq/L (ref 19–32)
Calcium: 9.4 mg/dL (ref 8.4–10.5)
Chloride: 103 mEq/L (ref 96–112)
Creatinine, Ser: 0.64 mg/dL (ref 0.40–1.20)
GFR: 108.8 mL/min (ref >60.00)
Glucose, Bld: 77 mg/dL (ref 70–99)
Potassium: 4.2 mEq/L (ref 3.5–5.1)
Sodium: 138 mEq/L (ref 135–145)
Total Bilirubin: 0.3 mg/dL (ref 0.2–1.2)
Total Protein: 7.9 g/dL (ref 6.0–8.3)

## 2014-09-19 LAB — LIPID PANEL
Cholesterol: 190 mg/dL (ref 0–200)
HDL: 64.2 mg/dL (ref >39.00)
LDL Cholesterol: 93 mg/dL (ref 0–99)
NonHDL: 126.02
Total CHOL/HDL Ratio: 3
Triglycerides: 165 mg/dL — ABNORMAL HIGH (ref 0.0–149.0)
VLDL: 33 mg/dL (ref 0.0–40.0)

## 2014-09-19 LAB — TSH: TSH: 3.21 u[IU]/mL (ref 0.35–4.50)

## 2014-09-19 LAB — CBC
HCT: 38.8 % (ref 36.0–46.0)
Hemoglobin: 13 g/dL (ref 12.0–15.0)
MCHC: 33.6 g/dL (ref 30.0–36.0)
MCV: 90.9 fl (ref 78.0–100.0)
Platelets: 278 10*3/uL (ref 150.0–400.0)
RBC: 4.26 Mil/uL (ref 3.87–5.11)
RDW: 13.1 % (ref 11.5–15.5)
WBC: 7 10*3/uL (ref 4.0–10.5)

## 2014-09-19 LAB — T4, FREE: Free T4: 0.81 ng/dL (ref 0.60–1.60)

## 2014-09-23 ENCOUNTER — Telehealth: Payer: Self-pay | Admitting: Family Medicine

## 2014-09-23 NOTE — Telephone Encounter (Signed)
Caller name:Kluger Tamira Relation to PV:GKKD Call back number:[phone] Pharmacy:  Reason for call: pt returning your call regarding lab results

## 2014-09-28 ENCOUNTER — Encounter: Payer: Self-pay | Admitting: Family Medicine

## 2014-09-28 DIAGNOSIS — I471 Supraventricular tachycardia, unspecified: Secondary | ICD-10-CM | POA: Insufficient documentation

## 2014-09-28 DIAGNOSIS — M549 Dorsalgia, unspecified: Secondary | ICD-10-CM

## 2014-09-28 DIAGNOSIS — O99891 Other specified diseases and conditions complicating pregnancy: Secondary | ICD-10-CM

## 2014-09-28 DIAGNOSIS — E782 Mixed hyperlipidemia: Secondary | ICD-10-CM

## 2014-09-28 DIAGNOSIS — L299 Pruritus, unspecified: Secondary | ICD-10-CM

## 2014-09-28 DIAGNOSIS — Z803 Family history of malignant neoplasm of breast: Secondary | ICD-10-CM

## 2014-09-28 HISTORY — DX: Mixed hyperlipidemia: E78.2

## 2014-09-28 HISTORY — DX: Family history of malignant neoplasm of breast: Z80.3

## 2014-09-28 HISTORY — DX: Pruritus, unspecified: L29.9

## 2014-09-28 HISTORY — DX: Other specified diseases and conditions complicating pregnancy: O99.891

## 2014-09-28 HISTORY — DX: Dorsalgia, unspecified: M54.9

## 2014-09-28 NOTE — Assessment & Plan Note (Signed)
Encouraged moist heat and gentle stretching as tolerated. May try NSAIDs and prescription meds as directed and report if symptoms worsen or seek immediate care. Flexeril prn

## 2014-09-28 NOTE — Assessment & Plan Note (Signed)
Patient is considering genetic testing

## 2014-09-28 NOTE — Assessment & Plan Note (Signed)
Unclear etiology, encouraged daily Zyrtec and Zantac and referred to dermatology for consideration

## 2014-09-28 NOTE — Assessment & Plan Note (Signed)
Encouraged heart healthy diet, increase exercise, avoid trans fats, consider a krill oil cap daily 

## 2014-09-28 NOTE — Assessment & Plan Note (Signed)
Well controlled on current dose of Tenormin

## 2014-09-28 NOTE — Assessment & Plan Note (Signed)
Thyroid studies wnl. Patient is requesting a prescription of Levothyroxine to use as need for fatigue will hold off for now.

## 2014-11-29 ENCOUNTER — Other Ambulatory Visit: Payer: Self-pay | Admitting: Cardiology

## 2014-12-04 ENCOUNTER — Ambulatory Visit
Admission: RE | Admit: 2014-12-04 | Discharge: 2014-12-04 | Disposition: A | Discharge status: Home / Self Care | Payer: BLUE CROSS/BLUE SHIELD | Source: Ambulatory Visit | Attending: Family Medicine | Admitting: Family Medicine

## 2014-12-04 DIAGNOSIS — Z1239 Encounter for other screening for malignant neoplasm of breast: Secondary | ICD-10-CM

## 2014-12-24 ENCOUNTER — Telehealth: Payer: Self-pay | Admitting: Family Medicine

## 2014-12-24 NOTE — Telephone Encounter (Signed)
Caller name: [place]  Relationship to patient: Self   Can be reached: [phone]   Reason for call: per providers schedule she will be out of the office on 11/29, pt has to be rescheduled. However, I am not showing a time available for pt. Pt says that she would like to have her Pap this year. Please advise for scheduling.

## 2014-12-25 NOTE — Telephone Encounter (Signed)
Pt has been scheduled.  °

## 2015-01-13 ENCOUNTER — Ambulatory Visit: Payer: BLUE CROSS/BLUE SHIELD | Admitting: Family Medicine

## 2015-01-22 ENCOUNTER — Ambulatory Visit: Payer: BLUE CROSS/BLUE SHIELD | Admitting: Family Medicine

## 2015-01-23 ENCOUNTER — Telehealth: Payer: Self-pay | Admitting: Family Medicine

## 2015-01-23 NOTE — Telephone Encounter (Signed)
Left message for patient to call back about the Flu Shot

## 2015-01-27 ENCOUNTER — Ambulatory Visit (INDEPENDENT_AMBULATORY_CARE_PROVIDER_SITE_OTHER): Payer: BLUE CROSS/BLUE SHIELD | Admitting: Family Medicine

## 2015-01-27 ENCOUNTER — Encounter: Payer: Self-pay | Admitting: Family Medicine

## 2015-01-27 ENCOUNTER — Other Ambulatory Visit (HOSPITAL_COMMUNITY)
Admission: RE | Admit: 2015-01-27 | Discharge: 2015-01-27 | Disposition: A | Discharge status: Home / Self Care | Payer: BLUE CROSS/BLUE SHIELD | Source: Ambulatory Visit | Attending: Family Medicine | Admitting: Family Medicine

## 2015-01-27 VITALS — BP 116/60 | HR 69 | Temp 98.3°F | Ht 61.0 in | Wt 134.1 lb

## 2015-01-27 DIAGNOSIS — E079 Disorder of thyroid, unspecified: Secondary | ICD-10-CM

## 2015-01-27 DIAGNOSIS — Z0000 Encounter for general adult medical examination without abnormal findings: Secondary | ICD-10-CM | POA: Diagnosis not present

## 2015-01-27 DIAGNOSIS — Z124 Encounter for screening for malignant neoplasm of cervix: Secondary | ICD-10-CM | POA: Insufficient documentation

## 2015-01-27 DIAGNOSIS — Z803 Family history of malignant neoplasm of breast: Secondary | ICD-10-CM

## 2015-01-27 DIAGNOSIS — Z01419 Encounter for gynecological examination (general) (routine) without abnormal findings: Secondary | ICD-10-CM | POA: Diagnosis not present

## 2015-01-27 DIAGNOSIS — E782 Mixed hyperlipidemia: Secondary | ICD-10-CM

## 2015-01-27 DIAGNOSIS — I471 Supraventricular tachycardia: Secondary | ICD-10-CM

## 2015-01-27 HISTORY — DX: Encounter for screening for malignant neoplasm of cervix: Z12.4

## 2015-01-27 MED ORDER — ATENOLOL 50 MG PO TABS: 75.0000 mg | ORAL_TABLET | Freq: Every day | ORAL | Status: DC

## 2015-01-27 MED ORDER — AZITHROMYCIN 250 MG PO TABS: ORAL_TABLET | ORAL | Status: DC

## 2015-01-27 NOTE — Patient Instructions (Signed)
Cholesterol Cholesterol is a white, waxy, fat-like substance needed by your body in small amounts. The liver makes all the cholesterol you need. Cholesterol is carried from the liver by the blood through the blood vessels. Deposits of cholesterol (plaque) may build up on blood vessel walls. These make the arteries narrower and stiffer. Cholesterol plaques increase the risk for heart attack and stroke.  You cannot feel your cholesterol level even if it is very high. The only way to know it is high is with a blood test. Once you know your cholesterol levels, you should keep a record of the test results. Work with your health care provider to keep your levels in the desired range.  WHAT DO THE RESULTS MEAN?  Total cholesterol is a rough measure of all the cholesterol in your blood.   LDL is the so-called bad cholesterol. This is the type that deposits cholesterol in the walls of the arteries. You want this level to be low.   HDL is the good cholesterol because it cleans the arteries and carries the LDL away. You want this level to be high.  Triglycerides are fat that the body can either burn for energy or store. High levels are closely linked to heart disease.  WHAT ARE THE DESIRED LEVELS OF CHOLESTEROL?  Total cholesterol below 200.   LDL below 100 for people at risk, below 70 for those at very high risk.   HDL above 50 is good, above 60 is best.   Triglycerides below 150.  HOW CAN I LOWER MY CHOLESTEROL?  Diet. Follow your diet programs as directed by your health care provider.   Choose fish or white meat chicken and [place], roasted or baked. Limit fatty cuts of red meat, fried foods, and processed meats, such as sausage and lunch meats.   Eat lots of fresh fruits and vegetables.  Choose whole grains, beans, pasta, potatoes, and cereals.   Use only small amounts of olive, corn, or canola oils.   Avoid butter, mayonnaise, shortening, or palm kernel oils.  Avoid foods with  trans fats.   Drink skim or nonfat milk and eat low-fat or nonfat yogurt and cheeses. Avoid whole milk, cream, ice cream, egg yolks, and full-fat cheeses.   Healthy desserts include angel food cake, ginger snaps, animal crackers, hard candy, popsicles, and low-fat or nonfat frozen yogurt. Avoid pastries, cakes, pies, and cookies.   Exercise. Follow your exercise programs as directed by your health care provider.   A regular program helps decrease LDL and raise HDL.   A regular program helps with weight control.   Do things that increase your activity level like gardening, walking, or taking the stairs. Ask your health care provider about how you can be more active in your daily life.   Medicine. Take medicine only as directed by your health care provider.   Medicine may be prescribed by your health care provider to help lower cholesterol and decrease the risk for heart disease.   If you have several risk factors, you may need medicine even if your levels are normal.   This information is not intended to replace advice given to you by your health care provider. Make sure you discuss any questions you have with your health care provider.   Document Released: 10/26/2000 Document Revised: 02/21/2014 Document Reviewed: 11/14/2012 Elsevier Interactive Patient Education [employer].

## 2015-01-27 NOTE — Progress Notes (Signed)
Pre visit review using our clinic review tool, if applicable. No additional management support is needed unless otherwise documented below in the visit note. 

## 2015-02-01 ENCOUNTER — Encounter: Payer: Self-pay | Admitting: Family Medicine

## 2015-02-01 NOTE — Assessment & Plan Note (Signed)
Encouraged heart healthy diet, increase exercise, avoid trans fats, consider a krill oil cap daily 

## 2015-02-01 NOTE — Assessment & Plan Note (Signed)
Well controlled on Atenolol

## 2015-02-01 NOTE — Progress Notes (Signed)
Subjective:    Patient ID: April Jackson, female    DOB: Oct 10, 1975, 39 y.o.   MRN: [id number]  Chief Complaint  Patient presents with  . Gynecologic Exam    HPI Patient is in today for Pap smear and follow-up on lab results. She feels well today although she is very anxious about her GYN exam. She has had some head cold symptoms but those are largely resolved. She was dealing with head congestion and chest congestion. She endorses some episodes of wheezing and shortness of breath but acknowledges these have all resolved. Tylenol Cold was marginally helpful. No acute complaints noted at today's visit. Denies CP/palp/SOB/HA/congestion/fevers/GI or GU c/o. Taking meds as prescribed  Past Medical History  Diagnosis Date  . SVT (supraventricular tachycardia) ([place])   . Seasonal allergies   . Thyroid disease   . Allergy     childhood allergies, hives.   . Hyperlipidemia, mixed 09/28/2014  . Back pain affecting pregnancy, antepartum 09/28/2014  . Back pain 09/28/2014  . Pruritus 09/28/2014  . FH: breast cancer 09/28/2014  . Cervical cancer screening 01/27/2015    Menarche at 11 Regular and moderate flow No history of abnormal pap in past No sexual activity G0P0, s/p Nohistory of abnormal MGM No concerns today No gyn surgeries    History reviewed. No pertinent past surgical history.  Family History  Problem Relation Age of Onset  . Heart disease Father   . Pulmonary fibrosis Father     pneumonia  . Cancer Sister 52    cervical cancer  . Hypertension Sister   . Hyperlipidemia Sister   . Gout Sister   . Diabetes Mother   . Hypertension Mother   . Hyperlipidemia Mother   . Cancer Mother     cervical cancer  . Diabetes Maternal Grandmother   . Stroke Paternal Grandmother   . Heart disease Paternal Grandfather     MI  . Hypertension Sister   . Cholelithiasis Sister   . Thyroid disease Sister   . Ovarian cysts Sister   . Cancer Sister     breast  . Asthma Sister     Social  History   Social History  . Marital Status: Single    Spouse Name: N/A  . Number of Children: 0  . Years of Education: N/A   Occupational History  . DENTIST    Social History Main Topics  . Smoking status: Never Smoker   . Smokeless tobacco: Not on file  . Alcohol Use: No  . Drug Use: No  . 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 Not on file   Social History Narrative    Outpatient Prescriptions Prior to Visit  Medication Sig Dispense Refill  . cetirizine (ZYRTEC) 10 MG tablet Take 10 mg by mouth daily.    . cyclobenzaprine (FLEXERIL) 10 MG tablet Take 10 mg by mouth 3 (three) times daily as needed for muscle spasms.    . Loratadine (CLARITIN) 10 MG CAPS Take 1 capsule by mouth daily.    . Multiple Vitamin (MULTI VITAMIN DAILY PO) Take 1 tablet by mouth daily.    Marland Kitchen triamcinolone cream (KENALOG) 0.1 % Apply 1 application topically 2 (two) times daily. 80 g 0  . atenolol (TENORMIN) 50 MG tablet TAKE ONE AND ONE-HALF TABLETS BY MOUTH ONCE DAILY 135 tablet 0   No facility-administered medications prior to visit.    Allergies  Allergen  Reactions  . Amoxicillin Other ([place] Comments)    thrush  . Penicillin V Potassium     Other reaction(s): Unknown  . Penicillins Other ([place] Comments)    thrush    Review of Systems  Constitutional: Negative for fever, chills and malaise/fatigue.  HENT: Negative for congestion and hearing loss.   Eyes: Negative for discharge.  Respiratory: Negative for cough, sputum production and shortness of breath.   Cardiovascular: Negative for chest pain, palpitations and leg swelling.  Gastrointestinal: Negative for heartburn, nausea, vomiting, abdominal pain, diarrhea, constipation and blood in stool.  Genitourinary: Negative for dysuria, urgency, frequency and hematuria.  Musculoskeletal: Negative for myalgias, back pain and falls.  Skin: Negative for rash.  Neurological:  Negative for dizziness, sensory change, loss of consciousness, weakness and headaches.  Endo/Heme/Allergies: Negative for environmental allergies. Does not bruise/bleed easily.  Psychiatric/Behavioral: Negative for depression and suicidal ideas. The patient is not nervous/anxious and does not have insomnia.        Objective:    Physical Exam  Constitutional: She is oriented to person, place, and time. She appears well-developed and well-nourished. No distress.  HENT:  Head: Normocephalic and atraumatic.  Eyes: Conjunctivae are normal.  Neck: Neck supple. No thyromegaly present.  Cardiovascular: Normal rate, regular rhythm and normal heart sounds.   No murmur heard. Pulmonary/Chest: Effort normal and breath sounds normal. No respiratory distress.  Abdominal: Soft. Bowel sounds are normal. She exhibits no distension and no mass. There is no tenderness.  Genitourinary: Vagina normal and uterus normal. No vaginal discharge found.  Exam difficult due to patient anxiety but no vulvar, vaginal or cervical lesions noted.  Breast exam unremarkable, no mases, skin changes or discharge  Musculoskeletal: She exhibits no edema.  Lymphadenopathy:    She has no cervical adenopathy.  Neurological: She is alert and oriented to person, place, and time.  Skin: Skin is warm and dry.  Psychiatric: She has a normal mood and affect. Her behavior is normal.    BP 116/60 mmHg  Pulse 69  Temp(Src) 98.3 F (36.8 C) (Oral)  Ht _0  (1.549 m)  Wt 134 lb 2 oz (60.839 kg)  BMI 25.36 kg/m2  SpO2 96%  LMP 01/20/2015 Wt Readings from Last 3 Encounters:  01/27/15 134 lb 2 oz (60.839 kg)  09/18/14 132 lb 4 oz (59.988 kg)  11/14/13 125 lb (56.7 kg)     Lab Results  Component Value Date   WBC 7.0 09/18/2014   HGB 13.0 09/18/2014   HCT 38.8 09/18/2014   PLT 278.0 09/18/2014   GLUCOSE 77 09/18/2014   CHOL 190 09/18/2014   TRIG 165.0* 09/18/2014   HDL 64.20 09/18/2014   LDLCALC 93 09/18/2014   ALT 9  09/18/2014   AST 16 09/18/2014   NA 138 09/18/2014   K 4.2 09/18/2014   CL 103 09/18/2014   CREATININE 0.64 09/18/2014   BUN 12 09/18/2014   CO2 26 09/18/2014   TSH 3.21 09/18/2014    Lab Results  Component Value Date   TSH 3.21 09/18/2014   Lab Results  Component Value Date   WBC 7.0 09/18/2014   HGB 13.0 09/18/2014   HCT 38.8 09/18/2014   MCV 90.9 09/18/2014   PLT 278.0 09/18/2014   Lab Results  Component Value Date   NA 138 09/18/2014   K 4.2 09/18/2014   CO2 26 09/18/2014   GLUCOSE 77 09/18/2014   BUN 12 09/18/2014   CREATININE 0.64 09/18/2014   BILITOT 0.3 09/18/2014  ALKPHOS 40 09/18/2014   AST 16 09/18/2014   ALT 9 09/18/2014   PROT 7.9 09/18/2014   ALBUMIN 4.5 09/18/2014   CALCIUM 9.4 09/18/2014   GFR 108.80 09/18/2014   Lab Results  Component Value Date   CHOL 190 09/18/2014   Lab Results  Component Value Date   HDL 64.20 09/18/2014   Lab Results  Component Value Date   LDLCALC 93 09/18/2014   Lab Results  Component Value Date   TRIG 165.0* 09/18/2014   Lab Results  Component Value Date   CHOLHDL 3 09/18/2014   No results found for: HGBA1C     Assessment & Plan:   Problem List Items Addressed This Visit    Cervical cancer screening - Primary    Pap today, no concerns on exam.       Relevant Orders   Cytology - PAP   FH: breast cancer    MGM October 2016 normal      Hyperlipidemia, mixed    Encouraged heart healthy diet, increase exercise, avoid trans fats, consider a krill oil cap daily      Relevant Medications   atenolol (TENORMIN) 50 MG tablet   SVT (supraventricular tachycardia) ([facility])    Well controlled on Atenolol      Relevant Medications   atenolol (TENORMIN) 50 MG tablet   Thyroid disease   Relevant Medications   atenolol (TENORMIN) 50 MG tablet   Other Relevant Orders   TSH    Other Visit Diagnoses    Preventative health care        Relevant Orders    Comp Met (CMET)    CBC with Differential/Platelet     TSH    Lipid panel       I have changed Ms. Wedemeyer's atenolol. I am also having her start on azithromycin. Additionally, I am having her maintain her Multiple Vitamin (MULTI VITAMIN DAILY PO), Loratadine, cetirizine, cyclobenzaprine, and triamcinolone cream.  Meds ordered this encounter  Medications  . azithromycin (ZITHROMAX) 250 MG tablet    Sig: 2 tabs po once then 1 tab po daily x 4 days    Dispense:  6 tablet    Refill:  0  . atenolol (TENORMIN) 50 MG tablet    Sig: Take 1.5 tablets (75 mg total) by mouth daily.    Dispense:  135 tablet    Refill:  1     Penni Homans, MD

## 2015-02-01 NOTE — Assessment & Plan Note (Signed)
Pap today, no concerns on exam.

## 2015-02-01 NOTE — Assessment & Plan Note (Signed)
[facility] October 2016 normal

## 2015-02-02 LAB — CYTOLOGY - PAP

## 2015-02-03 ENCOUNTER — Encounter: Payer: Self-pay | Admitting: Family Medicine

## 2015-05-21 DIAGNOSIS — I471 Supraventricular tachycardia: Secondary | ICD-10-CM | POA: Diagnosis not present

## 2015-05-21 DIAGNOSIS — R002 Palpitations: Secondary | ICD-10-CM | POA: Diagnosis not present

## 2015-07-03 ENCOUNTER — Other Ambulatory Visit (INDEPENDENT_AMBULATORY_CARE_PROVIDER_SITE_OTHER): Payer: BLUE CROSS/BLUE SHIELD

## 2015-07-03 ENCOUNTER — Telehealth: Payer: Self-pay | Admitting: Family Medicine

## 2015-07-03 ENCOUNTER — Other Ambulatory Visit: Payer: Self-pay | Admitting: Family Medicine

## 2015-07-03 DIAGNOSIS — W278XXA Contact with other nonpowered hand tool, initial encounter: Secondary | ICD-10-CM

## 2015-07-03 DIAGNOSIS — S61439A Puncture wound without foreign body of unspecified hand, initial encounter: Secondary | ICD-10-CM | POA: Diagnosis not present

## 2015-07-03 DIAGNOSIS — IMO0002 Reserved for concepts with insufficient information to code with codable children: Secondary | ICD-10-CM

## 2015-07-03 LAB — HIV ANTIBODY (ROUTINE TESTING W REFLEX): HIV 1&2 Ab, 4th Generation: NONREACTIVE

## 2015-07-03 NOTE — Telephone Encounter (Signed)
PCP did agree to put in order for HIV and Acute hepatitis panel. Patient aware

## 2015-07-03 NOTE — Telephone Encounter (Signed)
Patient works in a [employer] and was stuck with a needle from a patient today.  They had no records on this patient. She would like labs today asap to check for HIV,Aids, STD's etc. Called back number [phone]

## 2015-07-04 LAB — HEPATITIS PANEL, ACUTE
HCV Ab: NEGATIVE
Hep A IgM: NONREACTIVE
Hep B C IgM: NONREACTIVE
Hepatitis B Surface Ag: NEGATIVE

## 2015-07-06 ENCOUNTER — Other Ambulatory Visit: Payer: Self-pay | Admitting: Family Medicine

## 2015-07-06 DIAGNOSIS — IMO0002 Reserved for concepts with insufficient information to code with codable children: Secondary | ICD-10-CM

## 2015-07-14 ENCOUNTER — Ambulatory Visit (INDEPENDENT_AMBULATORY_CARE_PROVIDER_SITE_OTHER): Payer: BLUE CROSS/BLUE SHIELD | Admitting: Family Medicine

## 2015-07-14 ENCOUNTER — Encounter: Payer: Self-pay | Admitting: Family Medicine

## 2015-07-14 VITALS — BP 114/68 | HR 61 | Temp 98.4°F | Ht 61.0 in | Wt 135.5 lb

## 2015-07-14 DIAGNOSIS — I471 Supraventricular tachycardia: Secondary | ICD-10-CM

## 2015-07-14 DIAGNOSIS — R42 Dizziness and giddiness: Secondary | ICD-10-CM | POA: Diagnosis not present

## 2015-07-14 DIAGNOSIS — R000 Tachycardia, unspecified: Secondary | ICD-10-CM | POA: Diagnosis not present

## 2015-07-14 DIAGNOSIS — T7840XD Allergy, unspecified, subsequent encounter: Secondary | ICD-10-CM

## 2015-07-14 DIAGNOSIS — E079 Disorder of thyroid, unspecified: Secondary | ICD-10-CM

## 2015-07-14 DIAGNOSIS — R030 Elevated blood-pressure reading, without diagnosis of hypertension: Secondary | ICD-10-CM

## 2015-07-14 DIAGNOSIS — IMO0001 Reserved for inherently not codable concepts without codable children: Secondary | ICD-10-CM

## 2015-07-14 DIAGNOSIS — L299 Pruritus, unspecified: Secondary | ICD-10-CM

## 2015-07-14 MED ORDER — TRIAMCINOLONE ACETONIDE 0.1 % EX CREA
1.0000 | TOPICAL_CREAM | Freq: Two times a day (BID) | CUTANEOUS | Status: DC
Start: 2015-07-14 — End: 2015-09-09

## 2015-07-14 NOTE — Patient Instructions (Signed)
Tension Headache A tension headache is a feeling of pain, pressure, or aching that is often felt over the front and sides of the head. The pain can be dull, or it can feel tight (constricting). Tension headaches are not normally associated with nausea or vomiting, and they do not get worse with physical activity. Tension headaches can last from 30 minutes to several days. This is the most common type of headache. CAUSES The exact cause of this condition is not known. Tension headaches often begin after stress, anxiety, or depression. Other triggers may include:  Alcohol.  Too much caffeine, or caffeine withdrawal.  Respiratory infections, such as colds, flu, or sinus infections.  Dental problems or teeth clenching.  Fatigue.  Holding your head and neck in the same position for a long period of time, such as while using a computer.  Smoking. SYMPTOMS Symptoms of this condition include:  A feeling of pressure around the head.  Dull, aching head pain.  Pain felt over the front and sides of the head.  Tenderness in the muscles of the head, neck, and shoulders. DIAGNOSIS This condition may be diagnosed based on your symptoms and a physical exam. Tests may be done, such as a CT scan or an MRI of your head. These tests may be done if your symptoms are severe or unusual. TREATMENT This condition may be treated with lifestyle changes and medicines to help relieve symptoms. HOME CARE INSTRUCTIONS Managing Pain  Take over-the-counter and prescription medicines only as told by your health care provider.  Lie down in a dark, quiet room when you have a headache.  If directed, apply ice to the head and neck area:  Put ice in a plastic bag.  Place a towel between your skin and the bag.  Leave the ice on for 20 minutes, 2-3 times per day.  Use a heating pad or a hot shower to apply heat to the head and neck area as told by your health care provider. Eating and Drinking  Eat meals on  a regular schedule.  Limit alcohol use.  Decrease your caffeine intake, or stop using caffeine. General Instructions  Keep all follow-up visits as told by your health care provider. This is important.  Keep a headache journal to help find out what may trigger your headaches. For example, write down:  What you eat and drink.  How much sleep you get.  Any change to your diet or medicines.  Try massage or other relaxation techniques.  Limit stress.  Sit up straight, and avoid tensing your muscles.  Do not use tobacco products, including cigarettes, chewing tobacco, or e-cigarettes. If you need help quitting, ask your health care provider.  Exercise regularly as told by your health care provider.  Get 7-9 hours of sleep, or the amount recommended by your health care provider. SEEK MEDICAL CARE IF:  Your symptoms are not helped by medicine.  You have a headache that is different from what you normally experience.  You have nausea or you vomit.  You have a fever. SEEK IMMEDIATE MEDICAL CARE IF:  Your headache becomes severe.  You have repeated vomiting.  You have a stiff neck.  You have a loss of vision.  You have problems with speech.  You have pain in your eye or ear.  You have muscular weakness or loss of muscle control.  You lose your balance or you have trouble walking.  You feel faint or you pass out.  You have confusion.  This information is not intended to replace advice given to you by your health care provider. Make sure you discuss any questions you have with your health care provider.   Document Released: 01/31/2005 Document Revised: 10/22/2014 Document Reviewed: 05/26/2014 Elsevier Interactive Patient Education [employer].

## 2015-07-14 NOTE — Progress Notes (Signed)
Pre visit review using our clinic review tool, if applicable. No additional management support is needed unless otherwise documented below in the visit note. 

## 2015-07-16 ENCOUNTER — Other Ambulatory Visit (INDEPENDENT_AMBULATORY_CARE_PROVIDER_SITE_OTHER): Payer: BLUE CROSS/BLUE SHIELD

## 2015-07-16 DIAGNOSIS — L299 Pruritus, unspecified: Secondary | ICD-10-CM | POA: Diagnosis not present

## 2015-07-16 DIAGNOSIS — IMO0001 Reserved for inherently not codable concepts without codable children: Secondary | ICD-10-CM

## 2015-07-16 DIAGNOSIS — R030 Elevated blood-pressure reading, without diagnosis of hypertension: Secondary | ICD-10-CM | POA: Diagnosis not present

## 2015-07-16 DIAGNOSIS — R42 Dizziness and giddiness: Secondary | ICD-10-CM

## 2015-07-16 DIAGNOSIS — R000 Tachycardia, unspecified: Secondary | ICD-10-CM | POA: Diagnosis not present

## 2015-07-16 LAB — CBC
HCT: 35.9 % — ABNORMAL LOW (ref 36.0–46.0)
Hemoglobin: 11.9 g/dL — ABNORMAL LOW (ref 12.0–15.0)
MCHC: 33 g/dL (ref 30.0–36.0)
MCV: 90.3 fl (ref 78.0–100.0)
Platelets: 258 10*3/uL (ref 150.0–400.0)
RBC: 3.98 Mil/uL (ref 3.87–5.11)
RDW: 12.3 % (ref 11.5–15.5)
WBC: 6.1 10*3/uL (ref 4.0–10.5)

## 2015-07-16 LAB — COMPREHENSIVE METABOLIC PANEL
ALT: 14 U/L (ref 0–35)
AST: 21 U/L (ref 0–37)
Albumin: 4.3 g/dL (ref 3.5–5.2)
Alkaline Phosphatase: 37 U/L — ABNORMAL LOW (ref 39–117)
BUN: 6 mg/dL (ref 6–23)
CO2: 30 mEq/L (ref 19–32)
Calcium: 9.3 mg/dL (ref 8.4–10.5)
Chloride: 102 mEq/L (ref 96–112)
Creatinine, Ser: 0.6 mg/dL (ref 0.40–1.20)
GFR: 116.74 mL/min (ref >60.00)
Glucose, Bld: 97 mg/dL (ref 70–99)
Potassium: 4.3 mEq/L (ref 3.5–5.1)
Sodium: 138 mEq/L (ref 135–145)
Total Bilirubin: 0.4 mg/dL (ref 0.2–1.2)
Total Protein: 7.6 g/dL (ref 6.0–8.3)

## 2015-07-16 LAB — T3, FREE: T3, Free: 4 pg/mL (ref 2.3–4.2)

## 2015-07-16 LAB — T4, FREE: Free T4: 0.82 ng/dL (ref 0.60–1.60)

## 2015-07-16 LAB — TSH: TSH: 3.7 u[IU]/mL (ref 0.35–4.50)

## 2015-07-19 DIAGNOSIS — R030 Elevated blood-pressure reading, without diagnosis of hypertension: Secondary | ICD-10-CM | POA: Insufficient documentation

## 2015-07-19 DIAGNOSIS — I10 Essential (primary) hypertension: Secondary | ICD-10-CM | POA: Insufficient documentation

## 2015-07-19 NOTE — Assessment & Plan Note (Signed)
RRR today

## 2015-07-19 NOTE — Assessment & Plan Note (Signed)
Encouraged dsaily antihistamines and Flonase

## 2015-07-19 NOTE — Assessment & Plan Note (Signed)
Check labs today.

## 2015-07-19 NOTE — Progress Notes (Signed)
Patient ID: April Jackson, female   DOB: [birth year], 40 y.o.   MRN: [id number]   Subjective:    Patient ID: April Jackson, female    DOB: 1975-08-21, 40 y.o.   MRN: [id number]  Chief Complaint  Patient presents with  . Headache    HPI Patient is in today for evaluation of headaches.s he notes her bp has been slightly hi lately with systolics in the 353I and the diastolics in the 14E. She is noting poor sleep and headaches as well. Advil is helpful. Headaches occur towards the end of the day a couple times a week. Denies CP/palp/SOB/HA/congestion/fevers/GI or GU c/o. Taking meds as prescribed  Past Medical History  Diagnosis Date  . SVT (supraventricular tachycardia) ([place])   . Seasonal allergies   . Thyroid disease   . Allergy     childhood allergies, hives.   . Hyperlipidemia, mixed 09/28/2014  . Back pain affecting pregnancy, antepartum 09/28/2014  . Back pain 09/28/2014  . Pruritus 09/28/2014  . FH: breast cancer 09/28/2014  . Cervical cancer screening 01/27/2015    Menarche at 11 Regular and moderate flow No history of abnormal pap in past No sexual activity G0P0, s/p Nohistory of abnormal MGM No concerns today No gyn surgeries    No past surgical history on file.  Family History  Problem Relation Age of Onset  . Heart disease Father   . Pulmonary fibrosis Father     pneumonia  . Cancer Sister 74    cervical cancer  . Hypertension Sister   . Hyperlipidemia Sister   . Gout Sister   . Diabetes Mother   . Hypertension Mother   . Hyperlipidemia Mother   . Cancer Mother     cervical cancer  . Diabetes Maternal Grandmother   . Stroke Paternal Grandmother   . Heart disease Paternal Grandfather     MI  . Hypertension Sister   . Cholelithiasis Sister   . Thyroid disease Sister   . Ovarian cysts Sister   . Cancer Sister     breast  . Asthma Sister     Social History   Social History  . Marital Status: Single    Spouse Name: N/A  . Number of Children: 0  . Years of  Education: N/A   Occupational History  . DENTIST    Social History Main Topics  . Smoking status: Never Smoker   . Smokeless tobacco: Not on file  . Alcohol Use: No  . Drug Use: No  . 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 Not on file   Social History Narrative    Outpatient Prescriptions Prior to Visit  Medication Sig Dispense Refill  . atenolol (TENORMIN) 50 MG tablet Take 1.5 tablets (75 mg total) by mouth daily. 135 tablet 1  . cetirizine (ZYRTEC) 10 MG tablet Take 10 mg by mouth daily.    . cyclobenzaprine (FLEXERIL) 10 MG tablet Take 10 mg by mouth 3 (three) times daily as needed for muscle spasms.    . Loratadine (CLARITIN) 10 MG CAPS Take 1 capsule by mouth daily.    . Multiple Vitamin (MULTI VITAMIN DAILY PO) Take 1 tablet by mouth daily.    Marland Kitchen azithromycin (ZITHROMAX) 250 MG tablet 2 tabs po once then 1 tab po daily x 4 days 6 tablet 0  . triamcinolone cream (KENALOG) 0.1 % Apply 1 application topically 2 (two) times daily. 80 g  0   No facility-administered medications prior to visit.    Allergies  Allergen Reactions  . Amoxicillin Other ([place] Comments)    thrush  . Penicillin V Potassium     Other reaction(s): Unknown  . Penicillins Other ([facility] Comments)    thrush    Review of Systems  Constitutional: Negative for fever and malaise/fatigue.  HENT: Positive for congestion.   Eyes: Negative for blurred vision.  Respiratory: Negative for shortness of breath.   Cardiovascular: Positive for palpitations. Negative for chest pain and leg swelling.  Gastrointestinal: Negative for nausea, abdominal pain and blood in stool.  Genitourinary: Negative for dysuria and frequency.  Musculoskeletal: Negative for falls.  Skin: Negative for rash.  Neurological: Positive for headaches. Negative for loss of consciousness.  Endo/Heme/Allergies: Negative for environmental allergies.  Psychiatric/Behavioral:  Negative for depression. The patient is not nervous/anxious.        Objective:    Physical Exam  Constitutional: She is oriented to person, place, and time. She appears well-developed and well-nourished. No distress.  HENT:  Head: Normocephalic and atraumatic.  Nose: Nose normal.  Eyes: Right eye exhibits no discharge. Left eye exhibits no discharge.  Neck: Normal range of motion. Neck supple.  Cardiovascular: Normal rate and regular rhythm.   No murmur heard. Pulmonary/Chest: Effort normal and breath sounds normal.  Abdominal: Soft. Bowel sounds are normal. There is no tenderness.  Musculoskeletal: She exhibits no edema.  Neurological: She is alert and oriented to person, place, and time.  Skin: Skin is warm and dry.  Psychiatric: She has a normal mood and affect.  Nursing note and vitals reviewed.   BP 114/68 mmHg  Pulse 61  Temp(Src) 98.4 F (36.9 C) (Oral)  Ht _0  (1.549 m)  Wt 135 lb 8 oz (61.462 kg)  BMI 25.62 kg/m2  SpO2 98% Wt Readings from Last 3 Encounters:  07/14/15 135 lb 8 oz (61.462 kg)  01/27/15 134 lb 2 oz (60.839 kg)  09/18/14 132 lb 4 oz (59.988 kg)     Lab Results  Component Value Date   WBC 6.1 07/16/2015   HGB 11.9* 07/16/2015   HCT 35.9* 07/16/2015   PLT 258.0 07/16/2015   GLUCOSE 97 07/16/2015   CHOL 190 09/18/2014   TRIG 165.0* 09/18/2014   HDL 64.20 09/18/2014   LDLCALC 93 09/18/2014   ALT 14 07/16/2015   AST 21 07/16/2015   NA 138 07/16/2015   K 4.3 07/16/2015   CL 102 07/16/2015   CREATININE 0.60 07/16/2015   BUN 6 07/16/2015   CO2 30 07/16/2015   TSH 3.70 07/16/2015    Lab Results  Component Value Date   TSH 3.70 07/16/2015   Lab Results  Component Value Date   WBC 6.1 07/16/2015   HGB 11.9* 07/16/2015   HCT 35.9* 07/16/2015   MCV 90.3 07/16/2015   PLT 258.0 07/16/2015   Lab Results  Component Value Date   NA 138 07/16/2015   K 4.3 07/16/2015   CO2 30 07/16/2015   GLUCOSE 97 07/16/2015   BUN 6 07/16/2015    CREATININE 0.60 07/16/2015   BILITOT 0.4 07/16/2015   ALKPHOS 37* 07/16/2015   AST 21 07/16/2015   ALT 14 07/16/2015   PROT 7.6 07/16/2015   ALBUMIN 4.3 07/16/2015   CALCIUM 9.3 07/16/2015   GFR 116.74 07/16/2015   Lab Results  Component Value Date   CHOL 190 09/18/2014   Lab Results  Component Value Date   HDL 64.20 09/18/2014   Lab  Results  Component Value Date   LDLCALC 93 09/18/2014   Lab Results  Component Value Date   TRIG 165.0* 09/18/2014   Lab Results  Component Value Date   CHOLHDL 3 09/18/2014   No results found for: HGBA1C     Assessment & Plan:   Problem List Items Addressed This Visit    Thyroid disease    Check labs today      SVT (supraventricular tachycardia) ([facility])    RRR today      Pruritus - Primary    Cleanse with Witch Hazel Astringent and Triamcinolone cream prn      Relevant Medications   triamcinolone cream (KENALOG) 0.1 %   Other Relevant Orders   TSH (Completed)   T4, free (Completed)   T3, free (Completed)   CBC (Completed)   Comp Met (CMET) (Completed)   Elevated blood pressure    Well controlled. Encouraged heart healthy diet such as the DASH diet and exercise as tolerated.       Relevant Orders   TSH (Completed)   T4, free (Completed)   T3, free (Completed)   CBC (Completed)   Comp Met (CMET) (Completed)   Allergy    Encouraged dsaily antihistamines and Flonase       Other Visit Diagnoses    Tachycardia        Relevant Orders    TSH (Completed)    T4, free (Completed)    T3, free (Completed)    CBC (Completed)    Comp Met (CMET) (Completed)    Dizziness and giddiness        Relevant Orders    TSH (Completed)    T4, free (Completed)    T3, free (Completed)    CBC (Completed)    Comp Met (CMET) (Completed)       I have discontinued Ms. Monger's azithromycin. I am also having her maintain her Multiple Vitamin (MULTI VITAMIN DAILY PO), Loratadine, cetirizine, cyclobenzaprine, atenolol, and triamcinolone  cream.  Meds ordered this encounter  Medications  . triamcinolone cream (KENALOG) 0.1 %    Sig: Apply 1 application topically 2 (two) times daily.    Dispense:  80 g    Refill:  2     Penni Homans, MD

## 2015-07-19 NOTE — Assessment & Plan Note (Signed)
Cleanse with Witch Hazel Astringent and Triamcinolone cream prn

## 2015-07-19 NOTE — Assessment & Plan Note (Signed)
Well controlled. Encouraged heart healthy diet such as the DASH diet and exercise as tolerated.  

## 2015-08-14 ENCOUNTER — Emergency Department (HOSPITAL_BASED_OUTPATIENT_CLINIC_OR_DEPARTMENT_OTHER)
Admission: EM | Admit: 2015-08-14 | Discharge: 2015-08-14 | Disposition: A | Discharge status: Home / Self Care | Payer: BLUE CROSS/BLUE SHIELD | Attending: Emergency Medicine | Admitting: Emergency Medicine

## 2015-08-14 ENCOUNTER — Encounter (HOSPITAL_BASED_OUTPATIENT_CLINIC_OR_DEPARTMENT_OTHER): Payer: Self-pay

## 2015-08-14 DIAGNOSIS — Z79899 Other long term (current) drug therapy: Secondary | ICD-10-CM | POA: Diagnosis not present

## 2015-08-14 DIAGNOSIS — R42 Dizziness and giddiness: Secondary | ICD-10-CM

## 2015-08-14 DIAGNOSIS — I10 Essential (primary) hypertension: Secondary | ICD-10-CM | POA: Insufficient documentation

## 2015-08-14 DIAGNOSIS — E782 Mixed hyperlipidemia: Secondary | ICD-10-CM | POA: Diagnosis not present

## 2015-08-14 LAB — CBC WITH DIFFERENTIAL/PLATELET
Basophils Absolute: 0 10*3/uL (ref 0.0–0.1)
Basophils Relative: 0 %
Eosinophils Absolute: 0.1 10*3/uL (ref 0.0–0.7)
Eosinophils Relative: 2 %
HCT: 37.6 % (ref 36.0–46.0)
Hemoglobin: 12.4 g/dL (ref 12.0–15.0)
Lymphocytes Relative: 25 %
Lymphs Abs: 1.3 10*3/uL (ref 0.7–4.0)
MCH: 30 pg (ref 26.0–34.0)
MCHC: 33 g/dL (ref 30.0–36.0)
MCV: 90.8 fL (ref 78.0–100.0)
Monocytes Absolute: 0.4 10*3/uL (ref 0.1–1.0)
Monocytes Relative: 8 %
Neutro Abs: 3.4 10*3/uL (ref 1.7–7.7)
Neutrophils Relative %: 65 %
Platelets: 281 10*3/uL (ref 150–400)
RBC: 4.14 MIL/uL (ref 3.87–5.11)
RDW: 11.6 % (ref 11.5–15.5)
WBC: 5.2 10*3/uL (ref 4.0–10.5)

## 2015-08-14 LAB — PREGNANCY, URINE: Preg Test, Ur: NEGATIVE

## 2015-08-14 LAB — BASIC METABOLIC PANEL
Anion gap: 6 (ref 5–15)
BUN: 7 mg/dL (ref 6–20)
CO2: 28 mmol/L (ref 22–32)
Calcium: 8.9 mg/dL (ref 8.9–10.3)
Chloride: 104 mmol/L (ref 101–111)
Creatinine, Ser: 0.55 mg/dL (ref 0.44–1.00)
GFR calc Af Amer: >60 mL/min (ref >60)
GFR calc non Af Amer: >60 mL/min (ref >60)
Glucose, Bld: 94 mg/dL (ref 65–99)
Potassium: 3.8 mmol/L (ref 3.5–5.1)
Sodium: 138 mmol/L (ref 135–145)

## 2015-08-14 NOTE — ED Notes (Signed)
Pt is being followed by PCP for elevated BP for the last month.  She states that yesterday she was getting readings of 142/71, and this morning a 135/91, no chest pain, no SOB, no edema, no other new symptoms since visit with PCP last month.  She has a f/u with PCP in August.

## 2015-08-14 NOTE — ED Provider Notes (Signed)
CSN: [id number]     Arrival date & time 08/14/15  0650 History   First MD Initiated Contact with Patient 08/14/15 0719     Chief Complaint  Patient presents with  . Hypertension     (Consider location/radiation/quality/duration/timing/severity/associated sxs/prior Treatment) HPI Comments: 40 year old female with past medical history including SVT who presents with hypertension and lightheadedness. Patient is being followed by her PCP for hypertension. She is currently on atenolol for SVT but has never been on antihypertensive medications. Recently she has been getting higher readings at home including 142/71 yesterday and 135/91 this morning. She denies any associated chest pain, shortness of breath, lower extremity edema, or recent illness. She does report intermittent lightheadedness over the past few days. No syncope. She denies any problems with bleeding or blood in her stool. She has been eating and drinking normally. She reports mild, intermittent, gradual onset of headaches sometimes in the middle of the day at work. They're located in the back of her head is not associated with any visual changes or extremity weakness/numbness. She reports relief with over-the-counter medications.  Patient is a 40 y.o. female presenting with hypertension. The history is provided by the patient.  Hypertension    Past Medical History  Diagnosis Date  . SVT (supraventricular tachycardia) ([place])   . Seasonal allergies   . Thyroid disease   . Allergy     childhood allergies, hives.   . Hyperlipidemia, mixed 09/28/2014  . Back pain affecting pregnancy, antepartum 09/28/2014  . Back pain 09/28/2014  . Pruritus 09/28/2014  . FH: breast cancer 09/28/2014  . Cervical cancer screening 01/27/2015    Menarche at 11 Regular and moderate flow No history of abnormal pap in past No sexual activity G0P0, s/p Nohistory of abnormal MGM No concerns today No gyn surgeries   History reviewed. No pertinent past surgical  history. Family History  Problem Relation Age of Onset  . Heart disease Father   . Pulmonary fibrosis Father     pneumonia  . Cancer Sister 49    cervical cancer  . Hypertension Sister   . Hyperlipidemia Sister   . Gout Sister   . Diabetes Mother   . Hypertension Mother   . Hyperlipidemia Mother   . Cancer Mother     cervical cancer  . Diabetes Maternal Grandmother   . Stroke Paternal Grandmother   . Heart disease Paternal Grandfather     MI  . Hypertension Sister   . Cholelithiasis Sister   . Thyroid disease Sister   . Ovarian cysts Sister   . Cancer Sister     breast  . Asthma Sister    Social History  Substance Use Topics  . Smoking status: Never Smoker   . Smokeless tobacco: None  . Alcohol Use: No   OB History    No data available     Review of Systems 10 Systems reviewed and are negative for acute change except as noted in the HPI.    Allergies  Amoxicillin; Penicillin v potassium; and Penicillins  Home Medications   Prior to Admission medications   Medication Sig Start Date End Date Taking? Authorizing Provider  atenolol (TENORMIN) 50 MG tablet Take 1.5 tablets (75 mg total) by mouth daily. 01/27/15   [name], MD  cetirizine (ZYRTEC) 10 MG tablet Take 10 mg by mouth daily.    Historical Provider, MD  cyclobenzaprine (FLEXERIL) 10 MG tablet Take 10 mg by mouth 3 (three) times daily as needed for muscle spasms.  Historical Provider, MD  Loratadine (CLARITIN) 10 MG CAPS Take 1 capsule by mouth daily.    Historical Provider, MD  Multiple Vitamin (MULTI VITAMIN DAILY PO) Take 1 tablet by mouth daily.    Historical Provider, MD  triamcinolone cream (KENALOG) 0.1 % Apply 1 application topically 2 (two) times daily. 07/14/15   [name], MD   BP 111/71 mmHg  Pulse 60  Temp(Src) 98.3 F (36.8 C) (Oral)  Resp 18  Ht 5' 1"  (1.549 m)  Wt 135 lb (61.236 kg)  BMI 25.52 kg/m2  SpO2 100%  LMP 07/31/2015 Physical Exam  Constitutional: She is  oriented to person, place, and time. She appears well-developed and well-nourished. No distress.  HENT:  Head: Normocephalic and atraumatic.  Mouth/Throat: Oropharynx is clear and moist.  Moist mucous membranes  Eyes: Conjunctivae and EOM are normal. Pupils are equal, round, and reactive to light.  Neck: Neck supple.  Cardiovascular: Normal rate, regular rhythm and normal heart sounds.   No murmur heard. Pulmonary/Chest: Effort normal and breath sounds normal.  Abdominal: Soft. Bowel sounds are normal. She exhibits no distension. There is no tenderness.  Musculoskeletal: She exhibits no edema.  Neurological: She is alert and oriented to person, place, and time. No cranial nerve deficit. She exhibits normal muscle tone.  Fluent speech 5/5 strength and normal sensation x all 4 ext  Skin: Skin is warm and dry. No rash noted.  Psychiatric: She has a normal mood and affect. Judgment normal.  Nursing note and vitals reviewed.   ED Course  Procedures (including critical care time) Labs Review Labs Reviewed  BASIC METABOLIC PANEL  CBC WITH DIFFERENTIAL/PLATELET  PREGNANCY, URINE    Imaging Review No results found. I have personally reviewed and evaluated these  lab results as part of my medical decision-making.   EKG Interpretation   Date/Time:  Friday August 14 2015 07:56:40 EDT Ventricular Rate:  59 PR Interval:    QRS Duration: 91 QT Interval:  436 QTC Calculation: 432 R Axis:   81 Text Interpretation:  Sinus rhythm Borderline short PR interval since  previous tracing, tachycardia has resolved Confirmed by Del Overfelt MD, Keshon Markovitz  [phone]) on 08/14/2015 8:14:54 AM     Filed Vitals:   08/14/15 0700 08/14/15 0859  BP: 112/61 111/71  Pulse: 69 60  Temp: 98.3 F (36.8 C)   TempSrc: Oral   Resp: 16 18  Height: 5' 1"  (1.549 m)   Weight: 135 lb (61.236 kg)   SpO2: 100% 100%    MDM   Final diagnoses:  Intermittent lightheadedness   Pt p/w concerns for Elevated blood  pressure at home as well as reports of intermittent lightheadedness and mild, gradual onset of headaches. She was well-appearing on exam. Bp on arrival was 112/61. Regarding her blood pressure, I instructed her to follow-up with her PCP and to keep a daily journal of her blood pressure readings. The chest. He told shortness of breath, or severe headache to suggest hypertensive emergency/urgency. Regarding her lightheadedness, obtained basic lab work and EKG as well as orthostatic vital signs.  Labwork was unremarkable and showed no anemia. EKG unremarkable. Orthostatic VS reassuring. The patient's blood pressure has been low to normal here. Given her small stature and overall health, I suspect that her lightheadedness could actually be due to hypotension rather than hypertension as she has never had any hypotension here. She reports that she has been checking her blood pressure with a cuff at home but it may be inaccurate. The description  of her headaches suggest tension headaches and she states that she uses her hands a lot at work during the day. Her headaches often develop later in the day at work which is also consistent with tension headaches. No concerning symptoms or neurologic findings to suggest intracranial process. I have discussed supportive care regarding her headaches and instructed her to follow-up with her PCP regarding her blood pressure and lightheadedness, she may need an adjustment or change in her medication. Extensively reviewed return precautions. Patient voiced understanding and was discharged in satisfactory condition.  Sharlett Iles, MD 08/14/15 [phone]

## 2015-08-14 NOTE — ED Notes (Signed)
Went in to discharge pt, she states she is very dizzy now and would like to Shampine the dr. MD came to bedside.

## 2015-08-14 NOTE — ED Notes (Signed)
MD at bedside. 

## 2015-08-14 NOTE — ED Notes (Signed)
Pt reports intermittent dizziness with elevated BP at home. Pt denies dizziness now, denies chest pain and SOB.

## 2015-08-29 DIAGNOSIS — E079 Disorder of thyroid, unspecified: Secondary | ICD-10-CM | POA: Diagnosis not present

## 2015-08-29 DIAGNOSIS — I471 Supraventricular tachycardia: Secondary | ICD-10-CM | POA: Diagnosis not present

## 2015-08-29 DIAGNOSIS — R51 Headache: Secondary | ICD-10-CM | POA: Diagnosis not present

## 2015-08-29 DIAGNOSIS — R42 Dizziness and giddiness: Secondary | ICD-10-CM | POA: Diagnosis not present

## 2015-09-01 ENCOUNTER — Encounter: Payer: Self-pay | Admitting: Medical

## 2015-09-01 ENCOUNTER — Telehealth: Payer: Self-pay | Admitting: Medical

## 2015-09-01 ENCOUNTER — Ambulatory Visit (INDEPENDENT_AMBULATORY_CARE_PROVIDER_SITE_OTHER): Payer: BLUE CROSS/BLUE SHIELD | Admitting: Medical

## 2015-09-01 VITALS — BP 116/80 | HR 87 | Temp 98.1°F | Ht 61.0 in | Wt 130.8 lb

## 2015-09-01 DIAGNOSIS — I471 Supraventricular tachycardia: Secondary | ICD-10-CM

## 2015-09-01 NOTE — Progress Notes (Signed)
Pre visit review using our clinic review tool, if applicable. No additional management support is needed unless otherwise documented below in the visit note. 

## 2015-09-01 NOTE — Progress Notes (Signed)
Subjective:    Patient ID: April Jackson, female    DOB: 07/20/1975, 40 y.o.   MRN: [id number]  HPI   Pt in states on Saturday she went to [facility] ED in [place](pt states given med to bring down pulse very quickly) Pt has is on atenolol. Pt has history of svt for about 20 years. Pt states has been on atenolol 75 mg. She states with atelonol feels dizzy and fatigue. She feels like atenolol not controlling hr htn. Bp last night 160/90. She had hctz called in by her sister who is MD in [place]. Today bp good in am. And on low side presently.  Pt bp reading in our office are usually lower side.   Pt had 3 episodes of SVT over since May.  Other night pt had ekg v rate 157.  Atrial flutter per narrative. I did nt Kreps ekg.  Pt number is [phone]. Dr. Josefa Half. [facility]. Pt already has appointment on Friday.  Pt cell [phone].  Since discharge from ED. She does not report chest pain, palpatations or recurrent tachcardia.    Review of Systems  Constitutional: Negative for chills and fatigue.  Respiratory: Negative for chest tightness, shortness of breath and wheezing.   Cardiovascular: Negative for chest pain and palpitations.  Gastrointestinal: Negative for abdominal pain.  Musculoskeletal: Negative for back pain.  Skin: Negative for rash.  Neurological: Negative for dizziness, syncope, speech difficulty, weakness, light-headedness and headaches.  Hematological: Negative for adenopathy. Does not bruise/bleed easily.  Psychiatric/Behavioral: Negative for behavioral problems and confusion.   Past Medical History  Diagnosis Date  . SVT (supraventricular tachycardia) ([place])   . Seasonal allergies   . Thyroid disease   . Allergy     childhood allergies, hives.   . Hyperlipidemia, mixed 09/28/2014  . Back pain affecting pregnancy, antepartum 09/28/2014  . Back pain 09/28/2014  . Pruritus 09/28/2014  . FH: breast cancer 09/28/2014  . Cervical cancer screening 01/27/2015    Menarche at 11  Regular and moderate flow No history of abnormal pap in past No sexual activity G0P0, s/p Nohistory of abnormal MGM No concerns today No gyn surgeries     Social History   Social History  . Marital Status: Single    Spouse Name: N/A  . Number of Children: 0  . Years of Education: N/A   Occupational History  . DENTIST    Social History Main Topics  . Smoking status: Never Smoker   . Smokeless tobacco: Not on file  . Alcohol Use: No  . Drug Use: No  . 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 Not on file   Social History Narrative    No past surgical history on file.  Family History  Problem Relation Age of Onset  . Heart disease Father   . Pulmonary fibrosis Father     pneumonia  . Cancer Sister 32    cervical cancer  . Hypertension Sister   . Hyperlipidemia Sister   . Gout Sister   . Diabetes Mother   . Hypertension Mother   . Hyperlipidemia Mother   . Cancer Mother     cervical cancer  . Diabetes Maternal Grandmother   . Stroke Paternal Grandmother   . Heart disease Paternal Grandfather     MI  . Hypertension Sister   . Cholelithiasis Sister   . Thyroid disease Sister   . Ovarian cysts Sister   .  Cancer Sister     breast  . Asthma Sister     Allergies  Allergen Reactions  . Amoxicillin Other ([place] Comments)    thrush  . Penicillin V Potassium     Other reaction(s): Unknown  . Penicillins Other ([place] Comments)    thrush    Current Outpatient Prescriptions on File Prior to Visit  Medication Sig Dispense Refill  . atenolol (TENORMIN) 50 MG tablet Take 1.5 tablets (75 mg total) by mouth daily. 135 tablet 1  . cetirizine (ZYRTEC) 10 MG tablet Take 10 mg by mouth daily.    . cyclobenzaprine (FLEXERIL) 10 MG tablet Take 10 mg by mouth 3 (three) times daily as needed for muscle spasms.    . Loratadine (CLARITIN) 10 MG CAPS Take 1 capsule by mouth daily.    . Multiple Vitamin (MULTI  VITAMIN DAILY PO) Take 1 tablet by mouth daily.    Marland Kitchen triamcinolone cream (KENALOG) 0.1 % Apply 1 application topically 2 (two) times daily. 80 g 2   No current facility-administered medications on file prior to visit.    BP 116/80 mmHg  Pulse 87  Temp(Src) 98.1 F (36.7 C) (Oral)  Ht 5' 1"  (1.549 m)  Wt 130 lb 12.8 oz (59.33 kg)  BMI 24.73 kg/m2  SpO2 98%  LMP 08/29/2015       Objective:   Physical Exam  General Mental Status- Alert. General Appearance- Not in acute distress.   Skin General: Color- Normal Color. Moisture- Normal Moisture.  Neck Carotid Arteries- Normal color. Moisture- Normal Moisture. No carotid bruits. No JVD.  Chest and Lung Exam Auscultation: Breath Sounds:-Normal.  Cardiovascular Auscultation:Rythm- Regular. Murmurs & Other Heart Sounds:Auscultation of the heart reveals- No Murmurs.  Abdomen Inspection:-Inspeection Normal. Palpation/Percussion:Note:No mass. Palpation and Percussion of the abdomen reveal- Non Tender, Non Distended + BS, no rebound or guarding.    Neurologic Cranial Nerve exam:- CN III-XII intact(No nystagmus), symmetric smile.       Assessment & Plan:  For your hx of svt would continue the atenolol. You had recent atrial flutter and now by exam have normal rate. You have decided to go through ablation s with your cardiologist. I am going to try to talk with your cardiologist or his nurse today. To get advisement if he wants to add new bp med to your current regimen. Will call today and try to contact you this afternoon.  Follow up with cardiologist this Friday or as needed with myself.  I did call cardiologist office to get advisement on giving any medications that she may need prior to cardiologist appointment. Left message and nurse for cardiologist may call me back. I left my cell phone since I will be out of office this afternoon.  Did get through to Cardiologist nurse. Decided to continue current medication and not  add any med presently. Sent message to MA to contact pt and advise to that effect. Keep cardiology appointment this Friday.

## 2015-09-01 NOTE — Patient Instructions (Addendum)
For your hx of svt would continue the atenolol. You had recent atrial flutter and now by exam have normal rate. You have decided to go through ablation  with your cardiologist. I am going to try to talk with your cardiologist or his nurse today. To get advisement if he wants to add new bp med to your current regimen. Will call today and try to contact you this afternoon.  Follow up with cardiologist this Friday or as needed with myself.

## 2015-09-01 NOTE — Telephone Encounter (Signed)
I did talk with cardiologist nurse today. I informed her of current presentation. They advised no additional med necessary presently. Continue atenolol. [place] them on Friday. Please notify pt.

## 2015-09-02 NOTE — Telephone Encounter (Signed)
Spoke with pt and advised her of the Cardiologist instructions and she voices understanding. Pt did not have any further questions.

## 2015-09-03 DIAGNOSIS — Z8679 Personal history of other diseases of the circulatory system: Secondary | ICD-10-CM | POA: Diagnosis not present

## 2015-09-03 DIAGNOSIS — I471 Supraventricular tachycardia: Secondary | ICD-10-CM | POA: Diagnosis not present

## 2015-09-03 DIAGNOSIS — R002 Palpitations: Secondary | ICD-10-CM | POA: Diagnosis not present

## 2015-09-03 DIAGNOSIS — E782 Mixed hyperlipidemia: Secondary | ICD-10-CM | POA: Diagnosis not present

## 2015-09-08 ENCOUNTER — Encounter (HOSPITAL_BASED_OUTPATIENT_CLINIC_OR_DEPARTMENT_OTHER): Payer: Self-pay

## 2015-09-08 ENCOUNTER — Emergency Department (HOSPITAL_BASED_OUTPATIENT_CLINIC_OR_DEPARTMENT_OTHER)
Admission: EM | Admit: 2015-09-08 | Discharge: 2015-09-09 | Disposition: A | Discharge status: Home / Self Care | Payer: BLUE CROSS/BLUE SHIELD | Attending: Emergency Medicine | Admitting: Emergency Medicine

## 2015-09-08 ENCOUNTER — Emergency Department (HOSPITAL_BASED_OUTPATIENT_CLINIC_OR_DEPARTMENT_OTHER): Payer: BLUE CROSS/BLUE SHIELD

## 2015-09-08 DIAGNOSIS — R200 Anesthesia of skin: Secondary | ICD-10-CM | POA: Diagnosis not present

## 2015-09-08 DIAGNOSIS — D1802 Hemangioma of intracranial structures: Secondary | ICD-10-CM | POA: Insufficient documentation

## 2015-09-08 DIAGNOSIS — R202 Paresthesia of skin: Secondary | ICD-10-CM | POA: Insufficient documentation

## 2015-09-08 DIAGNOSIS — Z8679 Personal history of other diseases of the circulatory system: Secondary | ICD-10-CM | POA: Diagnosis not present

## 2015-09-08 DIAGNOSIS — Z79899 Other long term (current) drug therapy: Secondary | ICD-10-CM | POA: Insufficient documentation

## 2015-09-08 DIAGNOSIS — I10 Essential (primary) hypertension: Secondary | ICD-10-CM | POA: Diagnosis not present

## 2015-09-08 HISTORY — DX: Essential (primary) hypertension: I10

## 2015-09-08 LAB — CBC WITH DIFFERENTIAL/PLATELET
Basophils Absolute: 0 10*3/uL (ref 0.0–0.1)
Basophils Relative: 0 %
Eosinophils Absolute: 0.2 10*3/uL (ref 0.0–0.7)
Eosinophils Relative: 2 %
HCT: 39.4 % (ref 36.0–46.0)
Hemoglobin: 13.4 g/dL (ref 12.0–15.0)
Lymphocytes Relative: 30 %
Lymphs Abs: 2.5 10*3/uL (ref 0.7–4.0)
MCH: 30.3 pg (ref 26.0–34.0)
MCHC: 34 g/dL (ref 30.0–36.0)
MCV: 89.1 fL (ref 78.0–100.0)
Monocytes Absolute: 0.8 10*3/uL (ref 0.1–1.0)
Monocytes Relative: 9 %
Neutro Abs: 4.8 10*3/uL (ref 1.7–7.7)
Neutrophils Relative %: 59 %
Platelets: 316 10*3/uL (ref 150–400)
RBC: 4.42 MIL/uL (ref 3.87–5.11)
RDW: 11.7 % (ref 11.5–15.5)
WBC: 8.2 10*3/uL (ref 4.0–10.5)

## 2015-09-08 LAB — BASIC METABOLIC PANEL
Anion gap: 10 (ref 5–15)
BUN: 7 mg/dL (ref 6–20)
CO2: 27 mmol/L (ref 22–32)
Calcium: 9.3 mg/dL (ref 8.9–10.3)
Chloride: 98 mmol/L — ABNORMAL LOW (ref 101–111)
Creatinine, Ser: 0.67 mg/dL (ref 0.44–1.00)
GFR calc Af Amer: >60 mL/min (ref >60)
GFR calc non Af Amer: >60 mL/min (ref >60)
Glucose, Bld: 101 mg/dL — ABNORMAL HIGH (ref 65–99)
Potassium: 4.8 mmol/L (ref 3.5–5.1)
Sodium: 135 mmol/L (ref 135–145)

## 2015-09-08 NOTE — ED Triage Notes (Signed)
C/o numbness to right side of face, right arm and leg x 2-3 days-denies injury-NAD-steady gait

## 2015-09-08 NOTE — ED Notes (Signed)
Pt c/o intermittent right hand, arm and facial tingling for the last year, becoming more consistent over the last few days.  She denies current tingling.  Pt has a hx of A-fib and SVT, and is a right handed dentist.  Pt denies any confusion, no slurred speech or memory problems, no difficulty ambulating or dizziness associated with the tingling.

## 2015-09-08 NOTE — ED Provider Notes (Addendum)
[facility] Provider Note   CSN: [id number] Arrival date & time: 09/08/15  2047  First Provider Contact:  None  By signing my name below, I, Evelene Croon, attest that this documentation has been prepared under the direction and in the presence of Tanna Furry, MD . Electronically Signed: Evelene Croon, Scribe. 09/08/2015. 10:09 PM.  History   Chief Complaint Chief Complaint  Patient presents with  . Numbness    The history is provided by the patient. No language interpreter was used.   HPI Comments:  April Jackson is a 40 y.o. female with a history of HTN and SVT, who presents to the Emergency Department complaining of intermittent episodes of right sided numbness x ~ 2 days. She reports numbness to the right face, RUE, and RLE. Pt states the episodes last 30-40 minutes and resolve. She reports h/o same for the last 6 mths to 1 year but states recent episodes have been more frequent and more intense.  No alleviating factors noted. Pt states she was placed on Cardizem and ASA 1 week ago, when she was diagnosed with atrial flutter at [facility] ED where she was also cardioverted after going into SVT. Pt denies abnormalities on past ECHOs. Pt is right hand dominant. She also notes occasional neck and back pain.   Pt's Cardiologist is at [facility].   Past Medical History:  Diagnosis Date  . Allergy    childhood allergies, hives.   . Back pain 09/28/2014  . Back pain affecting pregnancy, antepartum 09/28/2014  . Cervical cancer screening 01/27/2015   Menarche at 11 Regular and moderate flow No history of abnormal pap in past No sexual activity G0P0, s/p Nohistory of abnormal MGM No concerns today No gyn surgeries  . FH: breast cancer 09/28/2014  . Hyperlipidemia, mixed 09/28/2014  . Hypertension   . Pruritus 09/28/2014  . Seasonal allergies   . SVT (supraventricular tachycardia) ([place])   . Thyroid disease     Patient Active Problem List   Diagnosis Date Noted  . Elevated blood pressure  07/19/2015  . Cervical cancer screening 01/27/2015  . Hyperlipidemia, mixed 09/28/2014  . Back pain 09/28/2014  . Pruritus 09/28/2014  . FH: breast cancer 09/28/2014  . SVT (supraventricular tachycardia) ([place])   . Thyroid disease 09/18/2014  . Allergy     History reviewed. No pertinent surgical history.  OB History    No data available       Home Medications    Prior to Admission medications   Medication Sig Start Date End Date Taking? Authorizing Provider  aspirin 325 MG tablet Take 325 mg by mouth daily.   Yes Historical Provider, MD  diltiazem (CARDIZEM) 120 MG tablet Take 120 mg by mouth 4 (four) times daily.   Yes Historical Provider, MD  HYDRALAZINE-HCTZ PO Take by mouth.   Yes Historical Provider, MD  OMEPRAZOLE PO Take by mouth.   Yes Historical Provider, MD  atenolol (TENORMIN) 50 MG tablet Take 1.5 tablets (75 mg total) by mouth daily. 01/27/15   [name], MD  cetirizine (ZYRTEC) 10 MG tablet Take 10 mg by mouth daily.    Historical Provider, MD  cyclobenzaprine (FLEXERIL) 10 MG tablet Take 10 mg by mouth 3 (three) times daily as needed for muscle spasms.    Historical Provider, MD  Loratadine (CLARITIN) 10 MG CAPS Take 1 capsule by mouth daily.    Historical Provider, MD  Multiple Vitamin (MULTI VITAMIN DAILY PO) Take 1 tablet by mouth daily.  Historical Provider, MD  triamcinolone cream (KENALOG) 0.1 % Apply 1 application topically 2 (two) times daily. 07/14/15   [name], MD    Family History Family History  Problem Relation Age of Onset  . Heart disease Father   . Pulmonary fibrosis Father     pneumonia  . Cancer Sister 60    cervical cancer  . Hypertension Sister   . Hyperlipidemia Sister   . Gout Sister   . Diabetes Mother   . Hypertension Mother   . Hyperlipidemia Mother   . Cancer Mother     cervical cancer  . Diabetes Maternal Grandmother   . Stroke Paternal Grandmother   . Heart disease Paternal Grandfather     MI  . Hypertension  Sister   . Cholelithiasis Sister   . Thyroid disease Sister   . Ovarian cysts Sister   . Cancer Sister     breast  . Asthma Sister     Social History Social History  Substance Use Topics  . Smoking status: Never Smoker  . Smokeless tobacco: Never Used  . Alcohol use No   Allergies   Amoxicillin; Penicillin v potassium; and Penicillins   Review of Systems Review of Systems  Constitutional: Negative for appetite change, chills, diaphoresis, fatigue and fever.  HENT: Negative for mouth sores, sore throat and trouble swallowing.   Eyes: Negative for visual disturbance.  Respiratory: Negative for cough, chest tightness, shortness of breath and wheezing.   Cardiovascular: Negative for chest pain.  Gastrointestinal: Negative for abdominal distention, abdominal pain, diarrhea, nausea and vomiting.  Endocrine: Negative for polydipsia, polyphagia and polyuria.  Genitourinary: Negative for dysuria, frequency and hematuria.  Musculoskeletal: Positive for back pain and neck pain. Negative for gait problem.  Skin: Negative for color change, pallor and rash.  Neurological: Positive for numbness. Negative for dizziness, syncope, weakness, light-headedness and headaches.  Hematological: Does not bruise/bleed easily.  Psychiatric/Behavioral: Negative for behavioral problems and confusion.   Physical Exam Updated Vital Signs BP 113/76   Pulse 63   Temp 98.3 F (36.8 C) (Oral)   Resp 13   Ht 5' 1"  (1.549 m)   Wt 128 lb (58.1 kg)   LMP 08/29/2015   SpO2 100%   BMI 24.19 kg/m   Physical Exam  Constitutional: She is oriented to person, place, and time. She appears well-developed and well-nourished. No distress.  HENT:  Head: Normocephalic.  Eyes: Conjunctivae are normal. Pupils are equal, round, and reactive to light. No scleral icterus.  Neck: Normal range of motion. Neck supple. No thyromegaly present.  Cardiovascular: Normal rate and regular rhythm.  Exam reveals no gallop and no  friction rub.   No murmur heard. Pulmonary/Chest: Effort normal and breath sounds normal. No respiratory distress. She has no wheezes. She has no rales.  Abdominal: Soft. Bowel sounds are normal. She exhibits no distension. There is no tenderness. There is no rebound.  Musculoskeletal: Normal range of motion.  Neurological: She is alert and oriented to person, place, and time.  Ulnar aspect of right arm with numbness  Skin: Skin is warm and dry. No rash noted.  Psychiatric: She has a normal mood and affect. Her behavior is normal.  Nursing note and vitals reviewed.   ED Treatments / Results  DIAGNOSTIC STUDIES:  Oxygen Saturation is 99% on RA, normal by my interpretation.    COORDINATION OF CARE:  9:42 PM Discussed treatment plan with pt at bedside and pt agreed to plan.  Labs (all labs ordered  are listed, but only abnormal results are displayed) Labs Reviewed  BASIC METABOLIC PANEL - Abnormal; Notable for the following:       Result Value   Chloride 98 (*)    Glucose, Bld 101 (*)    All other components within normal limits  CBC WITH DIFFERENTIAL/PLATELET    EKG  EKG Interpretation None       Radiology Ct Head Wo Contrast  Result Date: 09/08/2015 CLINICAL DATA:  Right-sided numbness for 2 days, initial encounter EXAM: CT HEAD WITHOUT CONTRAST TECHNIQUE: Contiguous axial images were obtained from the base of the skull through the vertex without intravenous contrast. COMPARISON:  None. FINDINGS: Bony calvarium is intact. No findings to suggest acute hemorrhage, acute infarction or space-occupying mass lesion are seen. IMPRESSION: No acute intracranial abnormality noted. Electronically Signed   By: Inez Catalina M.D.   On: 09/08/2015 22:30   Procedures Procedures  Medications Ordered in ED Medications - No data to display   Initial Impression / Assessment and Plan / ED Course  I have reviewed the triage vital signs and the nursing notes.  Pertinent labs & imaging  results that were available during my care of the patient were reviewed by me and considered in my medical decision making ([place] chart for details).  Clinical Course   X-rays were concerned the patient about this perhaps being neurological or a CVA. Differential diagnosis would also include Imitrex. She's never had neuro imaging until her CT tonight. I discussed with her that after 4 days of symptoms and normal CT was reassuring, but not 100% as her symptoms have been intermittent. I recommended a milligrams transferred to [facility] for neurological consultation and MRI. She politely declines. Her sister who accompanies her reiterates that they would strongly prefer to take her there by mouth the discussed with them that there are three-hour weights and 25 people in the emergency room there this may prolong her evaluation tonight. They again politely declined. She is discharged. Plan a discharge was to [facility] for MRI. I discussed the case with my partner Dr. Tamera Punt. They will be expecting her arrival.  Final Clinical Impressions(s) / ED Diagnoses   Final diagnoses:  Numbness    New Prescriptions New Prescriptions   No medications on file   I personally performed the services described in this documentation, which was scribed in my presence. The recorded information has been reviewed and is accurate.     Tanna Furry, MD 09/08/15 [id number]    Tanna Furry, MD 09/08/15 [phone]

## 2015-09-08 NOTE — Discharge Instructions (Addendum)
To [facility] emergency room for MRI tonight.

## 2015-09-09 ENCOUNTER — Emergency Department (HOSPITAL_COMMUNITY): Payer: BLUE CROSS/BLUE SHIELD

## 2015-09-09 DIAGNOSIS — R200 Anesthesia of skin: Secondary | ICD-10-CM | POA: Diagnosis not present

## 2015-09-09 NOTE — ED Provider Notes (Signed)
By signing my name below, I, Altamease Oiler, attest that this documentation has been prepared under the direction and in the presence of Varney Biles, MD. Electronically Signed: Altamease Oiler, ED Scribe. 09/09/15. 1:51 AM    April Jackson is a 40 y.o. female with PMHx of HTN and SVT who presents to the Emergency Department as a transfer from [facility] for an MRI. Pt presented to the ED for evaluation of intermittent right sided numbness for the last year. Over the last 2 days the episodes have been lasting for 30-40 minutes before resolving.    1:51 AM MRI Ordered.   LATE ENTRY: Neg MRI. Results discussed, including the caveroma. Pt to [place] pcp and get neuro consult if needed.   Varney Biles, MD 09/09/15 [phone]

## 2015-09-10 DIAGNOSIS — R002 Palpitations: Secondary | ICD-10-CM | POA: Diagnosis not present

## 2015-09-10 DIAGNOSIS — I471 Supraventricular tachycardia: Secondary | ICD-10-CM | POA: Diagnosis not present

## 2015-09-10 DIAGNOSIS — Z880 Allergy status to penicillin: Secondary | ICD-10-CM | POA: Diagnosis not present

## 2015-09-10 DIAGNOSIS — I10 Essential (primary) hypertension: Secondary | ICD-10-CM | POA: Diagnosis not present

## 2015-09-15 ENCOUNTER — Encounter: Payer: Self-pay | Admitting: Family Medicine

## 2015-09-15 ENCOUNTER — Ambulatory Visit (INDEPENDENT_AMBULATORY_CARE_PROVIDER_SITE_OTHER): Payer: BLUE CROSS/BLUE SHIELD | Admitting: Family Medicine

## 2015-09-15 DIAGNOSIS — E782 Mixed hyperlipidemia: Secondary | ICD-10-CM

## 2015-09-15 DIAGNOSIS — R030 Elevated blood-pressure reading, without diagnosis of hypertension: Secondary | ICD-10-CM

## 2015-09-15 DIAGNOSIS — IMO0001 Reserved for inherently not codable concepts without codable children: Secondary | ICD-10-CM

## 2015-09-15 NOTE — Patient Instructions (Signed)
Paroxysmal Supraventricular Tachycardia Paroxysmal supraventricular tachycardia (PSVT) is a type of abnormal heart rhythm. It causes your heart to beat very quickly and then suddenly stop beating so quickly. A normal heart rate is 60-100 beats per minute. During an episode of PSVT, your heart rate may be 150-250 beats per minute. This can make you feel light-headed and short of breath. An episode of PSVT can be frightening. It is usually not dangerous. The heart has four chambers. All chambers need to work together for the heart to beat effectively. A normal heartbeat usually starts in the right upper chamber of the heart (atrium) when an area (sinoatrial node) puts out an electrical signal that spreads to the other chambers. People with PSVT may have abnormal electrical pathways, or they may have other areas in the upper chambers that send out electrical signals. The result is a very rapid heartbeat. When your heart beats very quickly, it does not have time to fill completely with blood. When PSVT happens often or it lasts for long periods, it can lead to heart weakness and failure. Most people with PSVT do not have any other heart disease. CAUSES Abnormal electrical activity in the heart causes PSVT. It is not known why some people get PSVT and others do not. RISK FACTORS You may be more likely to have PSVT if:  You are 53-65 years old.  You are a woman. Other factors that may increase your chances of an attack include:  Stress.  Being tired.  Smoking.  Stimulant drugs.  Alcoholic drinks.  Caffeine.  Pregnancy. SIGNS AND SYMPTOMS A mild episode of PSVT may cause no symptoms. If you do have signs and symptoms, they may include:  A pounding heart.  Feeling of skipped heartbeats (palpitations).  Weakness.  Shortness of breath.  Tightness or pain in your chest.  Light-headedness.  Anxiety.  Dizziness.  Sweating.  Nausea.  A fainting spell. DIAGNOSIS Your health care  provider may suspect PSVT if you have symptoms that come and go. The health care provider will do a physical exam. If you are having an episode during the exam, the health care provider may be able to diagnose PSVT by listening to your heart and feeling your pulse. Tests may also be done, including:  An electrical study of your heart (electrocardiogram, or ECG).  A test in which you wear a portable ECG monitor all day (Holter monitor) or for several days (event monitor).  A test that involves taking an image of your heart using sound waves (echocardiogram) to rule out other causes of a fast heart rate. TREATMENT You may not need treatment if episodes of PSVT do not happen often or if they do not cause symptoms. If PSVT episodes do cause symptoms, your health care provider may first suggest trying a self-treatment called vagus nerve stimulation. The vagus nerve extends down from the brain. It regulates certain body functions. Stimulating this nerve can slow down the heart. Your health care provider can teach you ways to do this. You may need to try a few ways to find what works best for you. Options include:  Holding your breath and pushing, as though you are having a bowel movement.  Massaging an area on one side of your neck below your jaw.  Bending forward with your head between your legs.  Bending forward with your head between your legs and coughing.  Massaging your eyeballs with your eyes closed. If vagus nerve stimulation does not work, other treatment options include:  Medicines to prevent an attack.  Being treated in the hospital with medicine or electric shock to stop an attack (cardioversion). This treatment can include:  Getting medicine through an IV line.  Having a small electric shock delivered to your heart. You will be given medicine to make you sleep through this procedure.  If you have frequent episodes with symptoms, you may need a procedure to get rid of the faulty  areas of your heart (radiofrequency ablation) and end the episodes of PSVT. In this procedure:  A long, thin tube (catheter) is passed through one of your veins into your heart.  Energy directed through the catheter eliminates the areas of your heart that are causing abnormal electric stimulation. HOME CARE INSTRUCTIONS  Take medicines only as directed by your health care provider.  Do not use caffeine in any form if caffeine triggers episodes of PSVT. Otherwise, consume caffeine in moderation. This means no more than a few cups of coffee or the equivalent each day.  Do not drink alcohol if alcohol triggers episodes of PSVT. Otherwise, limit alcohol intake to no more than 1 drink per day for nonpregnant women and 2 drinks per day for men. One drink equals 12 ounces of beer, 5 ounces of wine, or 1 ounces of hard liquor.  Do not use any tobacco products, including cigarettes, chewing tobacco, or electronic cigarettes. If you need help quitting, ask your health care provider.  Try to get at least 7 hours of sleep each night.  Find healthy ways to manage stress.  Perform vagus nerve stimulation as directed by your health care provider.  Maintain a healthy weight.  Get some exercise on most days. Ask your health care provider to suggest some good activities for you. SEEK MEDICAL CARE IF:  You are having episodes of PSVT more often, or they are lasting longer.  Vagus nerve stimulation is no longer helping.  You have new symptoms during an episode. SEEK IMMEDIATE MEDICAL CARE IF:  You have chest pain or trouble breathing.  You have an episode of PSVT that has lasted longer than 20 minutes.  You have passed out from an episode of PSVT. These symptoms may represent a serious problem that is an emergency. Do not wait to [place] if the symptoms will go away. Get medical help right away. Call your local emergency services (911 in the [place] Do not drive yourself to the hospital.   This  information is not intended to replace advice given to you by your health care provider. Make sure you discuss any questions you have with your health care provider.   Document Released: 01/31/2005 Document Revised: 02/21/2014 Document Reviewed: 07/11/2013 Elsevier Interactive Patient Education [employer].

## 2015-09-15 NOTE — Progress Notes (Signed)
Pre visit review using our clinic review tool, if applicable. No additional management support is needed unless otherwise documented below in the visit note. 

## 2015-09-17 DIAGNOSIS — R002 Palpitations: Secondary | ICD-10-CM | POA: Diagnosis not present

## 2015-09-17 DIAGNOSIS — E782 Mixed hyperlipidemia: Secondary | ICD-10-CM | POA: Diagnosis not present

## 2015-09-17 DIAGNOSIS — I471 Supraventricular tachycardia: Secondary | ICD-10-CM | POA: Diagnosis not present

## 2015-09-23 DIAGNOSIS — I471 Supraventricular tachycardia: Secondary | ICD-10-CM | POA: Diagnosis not present

## 2015-09-27 NOTE — Progress Notes (Signed)
Patient ID: April Jackson, female   DOB: Mar 15, 1975, 40 y.o.   MRN: [id number]   Subjective:    Patient ID: April Jackson, female    DOB: 07-Aug-1975, 40 y.o.   MRN: [id number]  Chief Complaint  Patient presents with  . Follow-up    HPI Patient is in today for follow up. Is doing better but did not tolerate HCTZ. Caused tingling in hands/arms/legs. Better once she stops it. Denies CP/palp/SOB/HA/congestion/fevers/GI or GU c/o. Taking meds as prescribed  Past Medical History:  Diagnosis Date  . Allergy    childhood allergies, hives.   . Back pain 09/28/2014  . Back pain affecting pregnancy, antepartum 09/28/2014  . Cervical cancer screening 01/27/2015   Menarche at 11 Regular and moderate flow No history of abnormal pap in past No sexual activity G0P0, s/p Nohistory of abnormal MGM No concerns today No gyn surgeries  . FH: breast cancer 09/28/2014  . Hyperlipidemia, mixed 09/28/2014  . Hypertension   . Pruritus 09/28/2014  . Seasonal allergies   . SVT (supraventricular tachycardia) ([place])   . Thyroid disease     No past surgical history on file.  Family History  Problem Relation Age of Onset  . Heart disease Father   . Pulmonary fibrosis Father     pneumonia  . Cancer Sister 73    cervical cancer  . Hypertension Sister   . Hyperlipidemia Sister   . Gout Sister   . Diabetes Mother   . Hypertension Mother   . Hyperlipidemia Mother   . Cancer Mother     cervical cancer  . Diabetes Maternal Grandmother   . Stroke Paternal Grandmother   . Heart disease Paternal Grandfather     MI  . Hypertension Sister   . Cholelithiasis Sister   . Thyroid disease Sister   . Ovarian cysts Sister   . Cancer Sister     breast  . Asthma Sister     Social History   Social History  . Marital status: Single    Spouse name: N/A  . Number of children: 0  . Years of education: N/A   Occupational History  . DENTIST Self Employed   Social History Main Topics  . Smoking status: Never Smoker  .  Smokeless tobacco: Never Used  . Alcohol use No  . Drug use: No  . 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 Not on file   Social History Narrative  . No narrative on file    Outpatient Medications Prior to Visit  Medication Sig Dispense Refill  . aspirin 325 MG tablet Take 325 mg by mouth daily.    Marland Kitchen atenolol (TENORMIN) 50 MG tablet Take 1.5 tablets (75 mg total) by mouth daily. (Patient taking differently: Take 50 mg by mouth daily. ) 135 tablet 1  . cetirizine (ZYRTEC) 10 MG tablet Take 10 mg by mouth daily as needed for allergies.     . cyclobenzaprine (FLEXERIL) 10 MG tablet Take 10 mg by mouth 3 (three) times daily as needed for muscle spasms.    Marland Kitchen diltiazem (CARDIZEM) 120 MG tablet Take 120 mg by mouth at bedtime.     . hydrochlorothiazide (HYDRODIURIL) 25 MG tablet Take 25 mg by mouth daily as needed (fluid/hypertension).    . Loratadine (CLARITIN) 10 MG CAPS Take 1 capsule by mouth daily as needed (alergies).     Marland Kitchen omeprazole (PRILOSEC) 20 MG capsule  Take 20 mg by mouth daily.     No facility-administered medications prior to visit.     Allergies  Allergen Reactions  . Amoxicillin Other ([place] Comments)    thrush  . Penicillin V Potassium     Other reaction(s): Unknown  . Penicillins Other ([place] Comments)    thrush    Review of Systems  Constitutional: Negative for fever and malaise/fatigue.  HENT: Negative for congestion.   Eyes: Negative for blurred vision.  Respiratory: Negative for shortness of breath.   Cardiovascular: Negative for chest pain, palpitations and leg swelling.  Gastrointestinal: Negative for abdominal pain, blood in stool and nausea.  Genitourinary: Negative for dysuria and frequency.  Musculoskeletal: Negative for falls.  Skin: Negative for rash.  Neurological: Negative for dizziness, loss of consciousness and headaches.  Endo/Heme/Allergies: Negative for  environmental allergies.  Psychiatric/Behavioral: Negative for depression. The patient is not nervous/anxious.        Objective:    Physical Exam  Constitutional: She is oriented to person, place, and time. She appears well-developed and well-nourished. No distress.  HENT:  Head: Normocephalic and atraumatic.  Nose: Nose normal.  Eyes: Right eye exhibits no discharge. Left eye exhibits no discharge.  Neck: Normal range of motion. Neck supple.  Cardiovascular: Normal rate and regular rhythm.   No murmur heard. Pulmonary/Chest: Effort normal and breath sounds normal.  Abdominal: Soft. Bowel sounds are normal. There is no tenderness.  Musculoskeletal: She exhibits no edema.  Neurological: She is alert and oriented to person, place, and time.  Skin: Skin is warm and dry.  Psychiatric: She has a normal mood and affect.  Nursing note and vitals reviewed.   BP 106/62 (BP Location: Left Arm, Patient Position: Sitting, Cuff Size: Normal)   Pulse 66   Temp 98.6 F (37 C) (Oral)   Ht 5' 1"  (1.549 m)   Wt 128 lb 2 oz (58.1 kg)   LMP 08/29/2015   BMI 24.21 kg/m  Wt Readings from Last 3 Encounters:  09/15/15 128 lb 2 oz (58.1 kg)  09/08/15 128 lb (58.1 kg)  09/01/15 130 lb 12.8 oz (59.3 kg)     Lab Results  Component Value Date   WBC 8.2 09/08/2015   HGB 13.4 09/08/2015   HCT 39.4 09/08/2015   PLT 316 09/08/2015   GLUCOSE 101 (H) 09/08/2015   CHOL 190 09/18/2014   TRIG 165.0 (H) 09/18/2014   HDL 64.20 09/18/2014   LDLCALC 93 09/18/2014   ALT 14 07/16/2015   AST 21 07/16/2015   NA 135 09/08/2015   K 4.8 09/08/2015   CL 98 (L) 09/08/2015   CREATININE 0.67 09/08/2015   BUN 7 09/08/2015   CO2 27 09/08/2015   TSH 3.70 07/16/2015    Lab Results  Component Value Date   TSH 3.70 07/16/2015   Lab Results  Component Value Date   WBC 8.2 09/08/2015   HGB 13.4 09/08/2015   HCT 39.4 09/08/2015   MCV 89.1 09/08/2015   PLT 316 09/08/2015   Lab Results  Component Value  Date   NA 135 09/08/2015   K 4.8 09/08/2015   CO2 27 09/08/2015   GLUCOSE 101 (H) 09/08/2015   BUN 7 09/08/2015   CREATININE 0.67 09/08/2015   BILITOT 0.4 07/16/2015   ALKPHOS 37 (L) 07/16/2015   AST 21 07/16/2015   ALT 14 07/16/2015   PROT 7.6 07/16/2015   ALBUMIN 4.3 07/16/2015   CALCIUM 9.3 09/08/2015   ANIONGAP 10 09/08/2015   GFR 116.74  07/16/2015   Lab Results  Component Value Date   CHOL 190 09/18/2014   Lab Results  Component Value Date   HDL 64.20 09/18/2014   Lab Results  Component Value Date   LDLCALC 93 09/18/2014   Lab Results  Component Value Date   TRIG 165.0 (H) 09/18/2014   Lab Results  Component Value Date   CHOLHDL 3 09/18/2014   No results found for: HGBA1C     Assessment & Plan:   Problem List Items Addressed This Visit    Hyperlipidemia, mixed    Encouraged heart healthy diet, increase exercise, avoid trans fats, consider a krill oil cap daily      Elevated blood pressure    Encouraged heart healthy diet such as the DASH diet and exercise as tolerated. Did not tolerate hctz. Pressure well controlled.       Other Visit Diagnoses   None.     I am having Ms. Axton maintain her Loratadine, cetirizine, cyclobenzaprine, atenolol, diltiazem, aspirin, hydrochlorothiazide, and omeprazole.  No orders of the defined types were placed in this encounter.    Penni Homans, MD

## 2015-09-27 NOTE — Assessment & Plan Note (Addendum)
Encouraged heart healthy diet such as the DASH diet and exercise as tolerated. Did not tolerate hctz. Pressure well controlled.

## 2015-09-27 NOTE — Assessment & Plan Note (Signed)
Encouraged heart healthy diet, increase exercise, avoid trans fats, consider a krill oil cap daily 

## 2015-10-08 ENCOUNTER — Other Ambulatory Visit: Payer: BLUE CROSS/BLUE SHIELD

## 2015-10-08 DIAGNOSIS — E079 Disorder of thyroid, unspecified: Secondary | ICD-10-CM | POA: Diagnosis not present

## 2015-10-08 DIAGNOSIS — I471 Supraventricular tachycardia: Secondary | ICD-10-CM | POA: Diagnosis not present

## 2015-10-08 DIAGNOSIS — R002 Palpitations: Secondary | ICD-10-CM | POA: Diagnosis not present

## 2015-10-08 DIAGNOSIS — Z01818 Encounter for other preprocedural examination: Secondary | ICD-10-CM | POA: Diagnosis not present

## 2015-10-09 DIAGNOSIS — I471 Supraventricular tachycardia: Secondary | ICD-10-CM | POA: Diagnosis not present

## 2015-10-09 DIAGNOSIS — I10 Essential (primary) hypertension: Secondary | ICD-10-CM | POA: Diagnosis not present

## 2015-10-09 DIAGNOSIS — E039 Hypothyroidism, unspecified: Secondary | ICD-10-CM | POA: Diagnosis not present

## 2015-10-09 DIAGNOSIS — R002 Palpitations: Secondary | ICD-10-CM | POA: Diagnosis not present

## 2015-10-09 DIAGNOSIS — Z880 Allergy status to penicillin: Secondary | ICD-10-CM | POA: Diagnosis not present

## 2015-10-29 ENCOUNTER — Ambulatory Visit (INDEPENDENT_AMBULATORY_CARE_PROVIDER_SITE_OTHER): Payer: BLUE CROSS/BLUE SHIELD | Admitting: Family Medicine

## 2015-10-29 ENCOUNTER — Encounter: Payer: Self-pay | Admitting: Family Medicine

## 2015-10-29 ENCOUNTER — Other Ambulatory Visit (INDEPENDENT_AMBULATORY_CARE_PROVIDER_SITE_OTHER): Payer: BLUE CROSS/BLUE SHIELD

## 2015-10-29 VITALS — BP 92/60 | HR 63 | Temp 98.1°F | Ht 61.0 in | Wt 126.4 lb

## 2015-10-29 DIAGNOSIS — T148 Other injury of unspecified body region: Secondary | ICD-10-CM

## 2015-10-29 DIAGNOSIS — R05 Cough: Secondary | ICD-10-CM

## 2015-10-29 DIAGNOSIS — J029 Acute pharyngitis, unspecified: Secondary | ICD-10-CM | POA: Diagnosis not present

## 2015-10-29 DIAGNOSIS — IMO0002 Reserved for concepts with insufficient information to code with codable children: Secondary | ICD-10-CM

## 2015-10-29 DIAGNOSIS — R058 Other specified cough: Secondary | ICD-10-CM

## 2015-10-29 LAB — POCT RAPID STREP A (OFFICE): Rapid Strep A Screen: NEGATIVE

## 2015-10-29 LAB — HIV ANTIBODY (ROUTINE TESTING W REFLEX): HIV 1&2 Ab, 4th Generation: NONREACTIVE

## 2015-10-29 LAB — HEPATITIS PANEL, ACUTE
HCV Ab: NEGATIVE
Hep A IgM: NONREACTIVE
Hep B C IgM: NONREACTIVE
Hepatitis B Surface Ag: NEGATIVE

## 2015-10-29 MED ORDER — BENZONATATE 100 MG PO CAPS: 100.0000 mg | ORAL_CAPSULE | Freq: Three times a day (TID) | ORAL | 0 refills | Status: DC | PRN

## 2015-10-29 NOTE — Patient Instructions (Addendum)
You can take 600 mg of Ibuprofe (Motrin/Advil) 3 times daily as needed for pain. Remember to take it with some food.   Sore Throat A sore throat is pain, burning, irritation, or scratchiness of the throat. There is often pain or tenderness when swallowing or talking. A sore throat may be accompanied by other symptoms, such as coughing, sneezing, fever, and swollen neck glands. A sore throat is often the first sign of another sickness, such as a cold, flu, strep throat, or mononucleosis (commonly known as mono). Most sore throats go away without medical treatment. CAUSES  The most common causes of a sore throat include:  A viral infection, such as a cold, flu, or mono.  A bacterial infection, such as strep throat, tonsillitis, or whooping cough.  Seasonal allergies.  Dryness in the air.  Irritants, such as smoke or pollution.  Gastroesophageal reflux disease (GERD). HOME CARE INSTRUCTIONS   Only take over-the-counter medicines as directed by your caregiver.  Drink enough fluids to keep your urine clear or pale yellow.  Rest as needed.  Try using throat sprays, lozenges, or sucking on hard candy to ease any pain (if older than 4 years or as directed).  Sip warm liquids, such as broth, herbal tea, or warm water with honey to relieve pain temporarily. You may also eat or drink cold or frozen liquids such as frozen ice pops.  Gargle with salt water (mix 1 tsp salt with 8 oz of water).  Do not smoke and avoid secondhand smoke.  Put a cool-mist humidifier in your bedroom at night to moisten the air. You can also turn on a hot shower and sit in the bathroom with the door closed for 5-10 minutes. SEEK IMMEDIATE MEDICAL CARE IF:  You have difficulty breathing.  You are unable to swallow fluids, soft foods, or your saliva.  You have increased swelling in the throat.  Your sore throat does not get better in 7 days.  You have nausea and vomiting.  You have a fever or persistent  symptoms for more than 2-3 days.  You have a fever and your symptoms suddenly get worse. MAKE SURE YOU:   Understand these instructions.  Will watch your condition.  Will get help right away if you are not doing well or get worse.   This information is not intended to replace advice given to you by your health care provider. Make sure you discuss any questions you have with your health care provider.   Document Released: 03/10/2004 Document Revised: 02/21/2014 Document Reviewed: 10/09/2011 Elsevier Interactive Patient Education [employer].

## 2015-10-29 NOTE — Progress Notes (Signed)
Pre visit review using our clinic review tool, if applicable. No additional management support is needed unless otherwise documented below in the visit note. 

## 2015-10-29 NOTE — Progress Notes (Signed)
SUBJECTIVE:   April Jackson is a 40 y.o. female presents to the clinic for:  Chief Complaint  Patient presents with  . Sore Throat    started at the end of Aug-has gotten worse  . Cough    product-yellow-x 3 days    Complains of sore throat for 2.5 weeks.  Other associated symptoms: productive cough (3 days), runny nose started this morning.  Denies: fevers, itchy watery eyes and shortness of breath Sick Contacts: none known   Therapy to date: Advil  History  Smoking Status  . Never Smoker  Smokeless Tobacco  . Never Used    ROS: Pertinent items are noted in HPI  Patient's medications, allergies, past medical, surgical, social and family histories were reviewed and updated as appropriate.  OBJECTIVE:  BP 92/60 (BP Location: Left Arm, Patient Position: Sitting, Cuff Size: Normal)   Pulse 63   Temp 98.1 F (36.7 C) (Oral)   Ht 5' 1"  (1.549 m)   Wt 126 lb 6.4 oz (57.3 kg)   LMP 10/18/2015 (Exact Date)   SpO2 98%   BMI 23.88 kg/m  General: Awake, alert, appearing stated age Eyes: conjunctivae and sclerae clear Ears: normal TMs bilaterally Nose: no visible exudate Oropharynx: Tonsils 0's, no exudate, mild erythema, MMM Neck: supple, no significant adenopathy, no TTP Lungs: clear to auscultation, no wheezes, rales or rhonchi, symmetric air entry, normal effort Heart: rate and rhythm regular Skin:reveals no rash Psych: Age appropriate judgment and insight  ASSESSMENT/PLAN:  Sore throat - Plan: POCT rapid strep A  Orders as above. Rapid strep neg. 1/4 Cent[place] prior to cough initiation prompted testing. OK to do 3 Advil 3 times a day.  Supportive care. F/u prn. Pt voiced understanding and agreement to the plan.  [place], DO 10/29/15 8:30 AM

## 2015-11-26 DIAGNOSIS — H5203 Hypermetropia, bilateral: Secondary | ICD-10-CM | POA: Diagnosis not present

## 2015-12-11 DIAGNOSIS — E782 Mixed hyperlipidemia: Secondary | ICD-10-CM | POA: Diagnosis not present

## 2015-12-11 DIAGNOSIS — R002 Palpitations: Secondary | ICD-10-CM | POA: Diagnosis not present

## 2015-12-11 DIAGNOSIS — I471 Supraventricular tachycardia: Secondary | ICD-10-CM | POA: Diagnosis not present

## 2016-01-21 ENCOUNTER — Other Ambulatory Visit (INDEPENDENT_AMBULATORY_CARE_PROVIDER_SITE_OTHER): Payer: BLUE CROSS/BLUE SHIELD

## 2016-01-21 ENCOUNTER — Telehealth: Payer: Self-pay | Admitting: Family Medicine

## 2016-01-21 DIAGNOSIS — E079 Disorder of thyroid, unspecified: Secondary | ICD-10-CM

## 2016-01-21 DIAGNOSIS — Z0000 Encounter for general adult medical examination without abnormal findings: Secondary | ICD-10-CM | POA: Diagnosis not present

## 2016-01-21 LAB — CBC WITH DIFFERENTIAL/PLATELET
Basophils Absolute: 0 10*3/uL (ref 0.0–0.1)
Basophils Relative: 0.6 % (ref 0.0–3.0)
Eosinophils Absolute: 0.2 10*3/uL (ref 0.0–0.7)
Eosinophils Relative: 5.1 % — ABNORMAL HIGH (ref 0.0–5.0)
HCT: 35.3 % — ABNORMAL LOW (ref 36.0–46.0)
Hemoglobin: 11.9 g/dL — ABNORMAL LOW (ref 12.0–15.0)
Lymphocytes Relative: 39.7 % (ref 12.0–46.0)
Lymphs Abs: 1.9 10*3/uL (ref 0.7–4.0)
MCHC: 33.6 g/dL (ref 30.0–36.0)
MCV: 89.3 fl (ref 78.0–100.0)
Monocytes Absolute: 0.3 10*3/uL (ref 0.1–1.0)
Monocytes Relative: 7 % (ref 3.0–12.0)
Neutro Abs: 2.3 10*3/uL (ref 1.4–7.7)
Neutrophils Relative %: 47.6 % (ref 43.0–77.0)
Platelets: 282 10*3/uL (ref 150.0–400.0)
RBC: 3.96 Mil/uL (ref 3.87–5.11)
RDW: 12 % (ref 11.5–15.5)
WBC: 4.9 10*3/uL (ref 4.0–10.5)

## 2016-01-21 LAB — COMPREHENSIVE METABOLIC PANEL
ALT: 9 U/L (ref 0–35)
AST: 17 U/L (ref 0–37)
Albumin: 4.2 g/dL (ref 3.5–5.2)
Alkaline Phosphatase: 39 U/L (ref 39–117)
BUN: 8 mg/dL (ref 6–23)
CO2: 27 mEq/L (ref 19–32)
Calcium: 9 mg/dL (ref 8.4–10.5)
Chloride: 104 mEq/L (ref 96–112)
Creatinine, Ser: 0.65 mg/dL (ref 0.40–1.20)
GFR: 106.17 mL/min (ref >60.00)
Glucose, Bld: 88 mg/dL (ref 70–99)
Potassium: 4.3 mEq/L (ref 3.5–5.1)
Sodium: 138 mEq/L (ref 135–145)
Total Bilirubin: 0.3 mg/dL (ref 0.2–1.2)
Total Protein: 7.4 g/dL (ref 6.0–8.3)

## 2016-01-21 LAB — LIPID PANEL
Cholesterol: 178 mg/dL (ref 0–200)
HDL: 52.2 mg/dL (ref >39.00)
LDL Cholesterol: 103 mg/dL — ABNORMAL HIGH (ref 0–99)
NonHDL: 125.88
Total CHOL/HDL Ratio: 3
Triglycerides: 112 mg/dL (ref 0.0–149.0)
VLDL: 22.4 mg/dL (ref 0.0–40.0)

## 2016-01-21 LAB — TSH: TSH: 2.97 u[IU]/mL (ref 0.35–4.50)

## 2016-01-21 NOTE — Telephone Encounter (Signed)
Ok to add pap to visit, last years pap was missing cells

## 2016-01-21 NOTE — Telephone Encounter (Signed)
°  Relation to [facility] Call back number: Pharmacy:  Reason for call: pt has an appt on 12/14 for her CPE, pt would like to make Dr. Charlett Blake aware that she does not want to include a pelvic exam this time with her CPE

## 2016-01-28 ENCOUNTER — Encounter: Payer: BLUE CROSS/BLUE SHIELD | Admitting: Family Medicine

## 2016-03-06 ENCOUNTER — Encounter: Payer: Self-pay | Admitting: Family Medicine

## 2016-03-07 ENCOUNTER — Other Ambulatory Visit: Payer: Self-pay | Admitting: Family Medicine

## 2016-03-07 MED ORDER — TRIAMCINOLONE ACETONIDE 0.1 % EX CREA: TOPICAL_CREAM | Freq: Two times a day (BID) | CUTANEOUS | 3 refills | Status: DC

## 2016-04-08 DIAGNOSIS — R002 Palpitations: Secondary | ICD-10-CM | POA: Diagnosis not present

## 2016-04-08 DIAGNOSIS — E782 Mixed hyperlipidemia: Secondary | ICD-10-CM | POA: Diagnosis not present

## 2016-04-15 ENCOUNTER — Encounter: Payer: Self-pay | Admitting: Family Medicine

## 2016-04-15 ENCOUNTER — Ambulatory Visit (INDEPENDENT_AMBULATORY_CARE_PROVIDER_SITE_OTHER): Payer: BLUE CROSS/BLUE SHIELD | Admitting: Family Medicine

## 2016-04-15 DIAGNOSIS — I471 Supraventricular tachycardia: Secondary | ICD-10-CM

## 2016-04-15 DIAGNOSIS — J45909 Unspecified asthma, uncomplicated: Secondary | ICD-10-CM

## 2016-04-15 DIAGNOSIS — E782 Mixed hyperlipidemia: Secondary | ICD-10-CM

## 2016-04-15 DIAGNOSIS — J4520 Mild intermittent asthma, uncomplicated: Secondary | ICD-10-CM

## 2016-04-15 DIAGNOSIS — D649 Anemia, unspecified: Secondary | ICD-10-CM

## 2016-04-15 DIAGNOSIS — D509 Iron deficiency anemia, unspecified: Secondary | ICD-10-CM | POA: Diagnosis not present

## 2016-04-15 DIAGNOSIS — L299 Pruritus, unspecified: Secondary | ICD-10-CM

## 2016-04-15 DIAGNOSIS — T7840XD Allergy, unspecified, subsequent encounter: Secondary | ICD-10-CM

## 2016-04-15 DIAGNOSIS — D539 Nutritional anemia, unspecified: Secondary | ICD-10-CM | POA: Insufficient documentation

## 2016-04-15 HISTORY — DX: Unspecified asthma, uncomplicated: J45.909

## 2016-04-15 HISTORY — DX: Anemia, unspecified: D64.9

## 2016-04-15 LAB — CBC
HCT: 35.9 % (ref 35.0–45.0)
Hemoglobin: 11.7 g/dL (ref 11.7–15.5)
MCH: 29.7 pg (ref 27.0–33.0)
MCHC: 32.6 g/dL (ref 32.0–36.0)
MCV: 91.1 fL (ref 80.0–100.0)
MPV: 8.7 fL (ref 7.5–12.5)
Platelets: 283 10*3/uL (ref 140–400)
RBC: 3.94 MIL/uL (ref 3.80–5.10)
RDW: 12.8 % (ref 11.0–15.0)
WBC: 5.1 10*3/uL (ref 3.8–10.8)

## 2016-04-15 MED ORDER — TRIAMCINOLONE ACETONIDE 0.1 % EX CREA: TOPICAL_CREAM | Freq: Two times a day (BID) | CUTANEOUS | 3 refills | Status: DC

## 2016-04-15 MED ORDER — ALBUTEROL SULFATE HFA 108 (90 BASE) MCG/ACT IN AERS: 2.0000 | INHALATION_SPRAY | Freq: Four times a day (QID) | RESPIRATORY_TRACT | 2 refills | Status: DC | PRN

## 2016-04-15 NOTE — Progress Notes (Signed)
Subjective:  I acted as a Education administrator for Dr. Charlett Blake. [place], [place]   Patient ID: April Jackson, female    DOB: 05-17-1975, 41 y.o.   MRN: [id number]  Chief Complaint  Patient presents with  . Rash    Rash  This is a new problem. The current episode started more than 1 month ago. The problem has been gradually improving since onset. The affected locations include the right hip, torso and chest. The rash is characterized by itchiness and redness. Associated symptoms include shortness of breath. Pertinent negatives include no congestion, cough, fever or vomiting.    Patient is in today for a follow up on numerous medical conditions. She was supposed to have a CPE but she is having her menses. We will defer for 6 months. Her rash improved when she changed a shower filter and used her triamcinolone. She endorses ongoing anxiety with palpitations at times and in am she endorses some SOB in am she reports improves with Albuterol but she needs a refill. Denies CP/HA/congestion/fevers/GI or GU c/o. Taking meds as prescribed Patient Care Team: [name], MD as PCP - General (Family Medicine)   Past Medical History:  Diagnosis Date  . Allergy    childhood allergies, hives.   . Anemia 04/15/2016  . Back pain 09/28/2014  . Back pain affecting pregnancy, antepartum 09/28/2014  . Cervical cancer screening 01/27/2015   Menarche at 11 Regular and moderate flow No history of abnormal pap in past No sexual activity G0P0, s/p Nohistory of abnormal MGM No concerns today No gyn surgeries  . FH: breast cancer 09/28/2014  . Hyperlipidemia, mixed 09/28/2014  . Hypertension   . Pruritus 09/28/2014  . Reactive airway disease 04/15/2016  . Seasonal allergies   . SVT (supraventricular tachycardia) ([place])   . Thyroid disease     No past surgical history on file.  Family History  Problem Relation Age of Onset  . Heart disease Father   . Pulmonary fibrosis Father     pneumonia  . Cancer Sister 45    cervical cancer   . Hypertension Sister   . Hyperlipidemia Sister   . Gout Sister   . Diabetes Mother   . Hypertension Mother   . Hyperlipidemia Mother   . Cancer Mother     cervical cancer  . Diabetes Maternal Grandmother   . Stroke Paternal Grandmother   . Heart disease Paternal Grandfather     MI  . Hypertension Sister   . Cholelithiasis Sister   . Thyroid disease Sister   . Ovarian cysts Sister   . Cancer Sister     breast  . Asthma Sister     Social History   Social History  . Marital status: Single    Spouse name: N/A  . Number of children: 0  . Years of education: N/A   Occupational History  . DENTIST Self Employed   Social History Main Topics  . Smoking status: Never Smoker  . Smokeless tobacco: Never Used  . Alcohol use No  . Drug use: No  . 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 Not on file   Social History Narrative  . No narrative on file    Outpatient Medications Prior to Visit  Medication Sig Dispense Refill  . aspirin 325 MG tablet Take 325 mg by mouth daily.    Marland Kitchen atenolol (TENORMIN) 50 MG tablet Take 1.5  tablets (75 mg total) by mouth daily. (Patient taking differently: Take 50 mg by mouth daily. ) 135 tablet 1  . benzonatate (TESSALON) 100 MG capsule Take 1 capsule (100 mg total) by mouth 3 (three) times daily as needed for cough. 20 capsule 0  . cetirizine (ZYRTEC) 10 MG tablet Take 10 mg by mouth daily as needed for allergies.     . cyclobenzaprine (FLEXERIL) 10 MG tablet Take 10 mg by mouth 3 (three) times daily as needed for muscle spasms.    Marland Kitchen diltiazem (CARDIZEM) 120 MG tablet Take 120 mg by mouth at bedtime.     Marland Kitchen diltiazem (CARDIZEM) 30 MG tablet Take 1 tablet by mouth as needed every four to six hours for sustained heart rates greater than 100 bpm.    . hydrochlorothiazide (HYDRODIURIL) 25 MG tablet Take 25 mg by mouth daily as needed (fluid/hypertension).    . Loratadine  (CLARITIN) 10 MG CAPS Take 1 capsule by mouth daily as needed (alergies).     Marland Kitchen omeprazole (PRILOSEC) 20 MG capsule Take 20 mg by mouth daily.    Marland Kitchen triamcinolone cream (KENALOG) 0.1 % Apply topically 2 (two) times daily. Apply topically 30 g 3   No facility-administered medications prior to visit.     Allergies  Allergen Reactions  . Amoxicillin Other ([place] Comments)    Rash  . Nitrous Oxide Other ([place] Comments)    Paradoxical response-got very hyper  . Penicillins Other ([place] Comments)    Rash  . Sodium Chloride Swelling    And increased BP    Review of Systems  Constitutional: Negative for fever and malaise/fatigue.  HENT: Negative for congestion.   Eyes: Negative for blurred vision.  Respiratory: Positive for shortness of breath and wheezing. Negative for cough.   Cardiovascular: Negative for chest pain, palpitations and leg swelling.  Gastrointestinal: Negative for vomiting.  Musculoskeletal: Negative for back pain.  Skin: Positive for itching and rash.  Neurological: Negative for loss of consciousness and headaches.       Objective:    Physical Exam  Constitutional: She is oriented to person, place, and time. She appears well-developed and well-nourished. No distress.  HENT:  Head: Normocephalic and atraumatic.  Eyes: Conjunctivae are normal.  Neck: Normal range of motion. No thyromegaly present.  Cardiovascular: Normal rate and regular rhythm.   Pulmonary/Chest: Effort normal and breath sounds normal. She has no wheezes.  Abdominal: Soft. Bowel sounds are normal. There is no tenderness.  Musculoskeletal: Normal range of motion. She exhibits no edema or deformity.  Neurological: She is alert and oriented to person, place, and time.  Skin: Skin is warm and dry. Rash noted. She is not diaphoretic.  Dermatitis   Psychiatric: She has a normal mood and affect.    BP 115/72 (BP Location: Left Arm, Patient Position: Sitting, Cuff Size: Normal)   Pulse 70   Temp 98.1 F  (36.7 C) (Oral)   Resp 16   Wt 127 lb 12.8 oz (58 kg)   SpO2 100%   BMI 24.15 kg/m  Wt Readings from Last 3 Encounters:  04/15/16 127 lb 12.8 oz (58 kg)  10/29/15 126 lb 6.4 oz (57.3 kg)  09/15/15 128 lb 2 oz (58.1 kg)     Lab Results  Component Value Date   WBC 4.9 01/21/2016   HGB 11.9 (L) 01/21/2016   HCT 35.3 (L) 01/21/2016   PLT 282.0 01/21/2016   GLUCOSE 88 01/21/2016   CHOL 178 01/21/2016   TRIG 112.0 01/21/2016  HDL 52.20 01/21/2016   LDLCALC 103 (H) 01/21/2016   ALT 9 01/21/2016   AST 17 01/21/2016   NA 138 01/21/2016   K 4.3 01/21/2016   CL 104 01/21/2016   CREATININE 0.65 01/21/2016   BUN 8 01/21/2016   CO2 27 01/21/2016   TSH 2.97 01/21/2016    Lab Results  Component Value Date   TSH 2.97 01/21/2016   Lab Results  Component Value Date   WBC 4.9 01/21/2016   HGB 11.9 (L) 01/21/2016   HCT 35.3 (L) 01/21/2016   MCV 89.3 01/21/2016   PLT 282.0 01/21/2016   Lab Results  Component Value Date   NA 138 01/21/2016   K 4.3 01/21/2016   CO2 27 01/21/2016   GLUCOSE 88 01/21/2016   BUN 8 01/21/2016   CREATININE 0.65 01/21/2016   BILITOT 0.3 01/21/2016   ALKPHOS 39 01/21/2016   AST 17 01/21/2016   ALT 9 01/21/2016   PROT 7.4 01/21/2016   ALBUMIN 4.2 01/21/2016   CALCIUM 9.0 01/21/2016   ANIONGAP 10 09/08/2015   GFR 106.17 01/21/2016   Lab Results  Component Value Date   CHOL 178 01/21/2016   Lab Results  Component Value Date   HDL 52.20 01/21/2016   Lab Results  Component Value Date   LDLCALC 103 (H) 01/21/2016   Lab Results  Component Value Date   TRIG 112.0 01/21/2016   Lab Results  Component Value Date   CHOLHDL 3 01/21/2016   No results found for: HGBA1C     Assessment & Plan:   Problem List Items Addressed This Visit    Allergy    Can use Loratadine once to twice daily as needed      Hyperlipidemia, mixed    Encouraged heart healthy diet, increase exercise, avoid trans fats, consider a krill oil cap daily       Relevant Orders   CBC   SVT (supraventricular tachycardia) ([place])    Intermittently has sympotms but much better and following with cardiology      Relevant Orders   CBC   Pruritus    Has been struggling with itchy rash for past 6 weeks. With change in her shower filter and added a cream, triamcinolone.      Relevant Orders   CBC   Reactive airway disease    Given refill on Albuterol to use sparingly      Anemia    Increase leafy greens, consider increased lean red meat and using cast iron cookware. Continue to monitor, report any concerns. Check cbc today. Currently on her menstrual cycle         I am having Ms. Flanary start on albuterol. I am also having her maintain her Loratadine, cetirizine, cyclobenzaprine, atenolol, diltiazem, aspirin, hydrochlorothiazide, omeprazole, diltiazem, benzonatate, and triamcinolone cream.  Meds ordered this encounter  Medications  . albuterol (PROVENTIL HFA;VENTOLIN HFA) 108 (90 Base) MCG/ACT inhaler    Sig: Inhale 2 puffs into the lungs every 6 (six) hours as needed for wheezing or shortness of breath.    Dispense:  1 Inhaler    Refill:  2  . triamcinolone cream (KENALOG) 0.1 %    Sig: Apply topically 2 (two) times daily. Apply topically    Dispense:  30 g    Refill:  3    CMA served as scribe during this visit. History, Physical and Plan performed by medical provider. Documentation and orders reviewed and attested to.  Penni Homans, MD

## 2016-04-15 NOTE — Assessment & Plan Note (Signed)
Increase leafy greens, consider increased lean red meat and using cast iron cookware. Continue to monitor, report any concerns. Check cbc today. Currently on her menstrual cycle

## 2016-04-15 NOTE — Assessment & Plan Note (Signed)
Intermittently has sympotms but much better and following with cardiology

## 2016-04-15 NOTE — Assessment & Plan Note (Signed)
Has been struggling with itchy rash for past 6 weeks. With change in her shower filter and added a cream, triamcinolone.

## 2016-04-15 NOTE — Assessment & Plan Note (Signed)
Can use Loratadine once to twice daily as needed

## 2016-04-15 NOTE — Progress Notes (Signed)
Pre visit review using our clinic review tool, if applicable. No additional management support is needed unless otherwise documented below in the visit note. 

## 2016-04-15 NOTE — Assessment & Plan Note (Signed)
Encouraged heart healthy diet, increase exercise, avoid trans fats, consider a krill oil cap daily 

## 2016-04-15 NOTE — Patient Instructions (Addendum)
   Contact Dermatitis Dermatitis is redness, soreness, and swelling (inflammation) of the skin. Contact dermatitis is a reaction to certain substances that touch the skin. You either touched something that irritated your skin, or you have allergies to something you touched. Follow these instructions at home: Pioneer your skin as needed.  Apply cool compresses to the affected areas.  Try taking a bath with:  Epsom salts. Follow the instructions on the package. You can get these at a pharmacy or grocery store.  Baking soda. Pour a small amount into the bath as told by your doctor.  Colloidal oatmeal. Follow the instructions on the package. You can get this at a pharmacy or grocery store.  Try applying baking soda paste to your skin. Stir water into baking soda until it looks like paste.  Do not scratch your skin.  Bathe less often.  Bathe in lukewarm water. Avoid using hot water. Medicines   Take or apply over-the-counter and prescription medicines only as told by your doctor.  If you were prescribed an antibiotic medicine, take or apply your antibiotic as told by your doctor. Do not stop taking the antibiotic even if your condition starts to get better. General instructions   Keep all follow-up visits as told by your doctor. This is important.  Avoid the substance that caused your reaction. If you do not know what caused it, keep a journal to try to track what caused it. Write down:  What you eat.  What cosmetic products you use.  What you drink.  What you wear in the affected area. This includes jewelry.  If you were given a bandage (dressing), take care of it as told by your doctor. This includes when to change and remove it. Contact a doctor if:  You do not get better with treatment.  Your condition gets worse.  You have signs of infection such as:  Swelling.  Tenderness.  Redness.  Soreness.  Warmth.  You have a fever.  You have new  symptoms. Get help right away if:  You have a very bad headache.  You have neck pain.  Your neck is stiff.  You throw up (vomit).  You feel very sleepy.  You Mittman red streaks coming from the affected area.  Your bone or joint underneath the affected area becomes painful after the skin has healed.  The affected area turns darker.  You have trouble breathing. This information is not intended to replace advice given to you by your health care provider. Make sure you discuss any questions you have with your health care provider. Document Released: 11/28/2008 Document Revised: 07/09/2015 Document Reviewed: 06/18/2014 Elsevier Interactive Patient Education  2017 [employer].

## 2016-04-15 NOTE — Assessment & Plan Note (Signed)
Given refill on Albuterol to use sparingly

## 2016-06-17 DIAGNOSIS — R0683 Snoring: Secondary | ICD-10-CM | POA: Diagnosis not present

## 2016-06-17 DIAGNOSIS — I471 Supraventricular tachycardia: Secondary | ICD-10-CM | POA: Diagnosis not present

## 2016-09-22 ENCOUNTER — Telehealth: Payer: Self-pay | Admitting: Family Medicine

## 2016-09-22 DIAGNOSIS — E782 Mixed hyperlipidemia: Secondary | ICD-10-CM

## 2016-09-22 DIAGNOSIS — R030 Elevated blood-pressure reading, without diagnosis of hypertension: Secondary | ICD-10-CM

## 2016-09-22 NOTE — Telephone Encounter (Signed)
°  Relation to TT:SVXB Call back number:[phone]   Reason for call:  Patient 11/04/16 physical had to be [facility] due to provider, patient prefers Friday only due to her work schedule, PCP next available Friday physical would be 01/13/17, patient requesting pre visit labs, please advise

## 2016-09-22 NOTE — Telephone Encounter (Signed)
I would be willing to come in early at 7:15 on a Friday morning for her if she is worried about waiting so long some time in October. Either way can order cmp, cbc, lipid, tsh prior to pe for SVT, hyperlipidemia, and anemia

## 2016-09-23 ENCOUNTER — Other Ambulatory Visit: Payer: Self-pay | Admitting: Family Medicine

## 2016-09-23 DIAGNOSIS — G4733 Obstructive sleep apnea (adult) (pediatric): Secondary | ICD-10-CM | POA: Diagnosis not present

## 2016-09-23 DIAGNOSIS — I471 Supraventricular tachycardia, unspecified: Secondary | ICD-10-CM

## 2016-09-23 DIAGNOSIS — D649 Anemia, unspecified: Secondary | ICD-10-CM

## 2016-09-23 DIAGNOSIS — E785 Hyperlipidemia, unspecified: Secondary | ICD-10-CM

## 2016-09-23 NOTE — Telephone Encounter (Signed)
Labs ordered (will need a lab appt to do these labs) You may call the patient and let her know below message from Dr. Charlett Blake

## 2016-09-23 NOTE — Telephone Encounter (Signed)
Patient scheduled for labs only for 12/30/2016. Thank You

## 2016-10-07 DIAGNOSIS — E782 Mixed hyperlipidemia: Secondary | ICD-10-CM | POA: Diagnosis not present

## 2016-10-07 DIAGNOSIS — I471 Supraventricular tachycardia: Secondary | ICD-10-CM | POA: Diagnosis not present

## 2016-10-07 DIAGNOSIS — G4733 Obstructive sleep apnea (adult) (pediatric): Secondary | ICD-10-CM | POA: Diagnosis not present

## 2016-11-04 ENCOUNTER — Encounter: Payer: BLUE CROSS/BLUE SHIELD | Admitting: Family Medicine

## 2016-12-30 ENCOUNTER — Other Ambulatory Visit (INDEPENDENT_AMBULATORY_CARE_PROVIDER_SITE_OTHER): Payer: BLUE CROSS/BLUE SHIELD

## 2016-12-30 DIAGNOSIS — R030 Elevated blood-pressure reading, without diagnosis of hypertension: Secondary | ICD-10-CM | POA: Diagnosis not present

## 2016-12-30 DIAGNOSIS — E782 Mixed hyperlipidemia: Secondary | ICD-10-CM | POA: Diagnosis not present

## 2016-12-30 LAB — COMPREHENSIVE METABOLIC PANEL
ALT: 11 U/L (ref 0–35)
AST: 19 U/L (ref 0–37)
Albumin: 4.1 g/dL (ref 3.5–5.2)
Alkaline Phosphatase: 42 U/L (ref 39–117)
BUN: 9 mg/dL (ref 6–23)
CO2: 29 mEq/L (ref 19–32)
Calcium: 9.2 mg/dL (ref 8.4–10.5)
Chloride: 101 mEq/L (ref 96–112)
Creatinine, Ser: 0.57 mg/dL (ref 0.40–1.20)
GFR: 122.99 mL/min (ref >60.00)
Glucose, Bld: 88 mg/dL (ref 70–99)
Potassium: 3.9 mEq/L (ref 3.5–5.1)
Sodium: 138 mEq/L (ref 135–145)
Total Bilirubin: 0.4 mg/dL (ref 0.2–1.2)
Total Protein: 7.4 g/dL (ref 6.0–8.3)

## 2016-12-30 LAB — LIPID PANEL
Cholesterol: 203 mg/dL — ABNORMAL HIGH (ref 0–200)
HDL: 57 mg/dL (ref >39.00)
LDL Cholesterol: 115 mg/dL — ABNORMAL HIGH (ref 0–99)
NonHDL: 146.39
Total CHOL/HDL Ratio: 4
Triglycerides: 159 mg/dL — ABNORMAL HIGH (ref 0.0–149.0)
VLDL: 31.8 mg/dL (ref 0.0–40.0)

## 2016-12-30 LAB — CBC
HCT: 36.2 % (ref 36.0–46.0)
Hemoglobin: 12.2 g/dL (ref 12.0–15.0)
MCHC: 33.6 g/dL (ref 30.0–36.0)
MCV: 91.1 fl (ref 78.0–100.0)
Platelets: 284 10*3/uL (ref 150.0–400.0)
RBC: 3.97 Mil/uL (ref 3.87–5.11)
RDW: 12.5 % (ref 11.5–15.5)
WBC: 4.4 10*3/uL (ref 4.0–10.5)

## 2016-12-30 LAB — TSH: TSH: 3.91 u[IU]/mL (ref 0.35–4.50)

## 2017-01-13 ENCOUNTER — Encounter: Payer: Self-pay | Admitting: Family Medicine

## 2017-01-13 ENCOUNTER — Other Ambulatory Visit: Payer: Self-pay | Admitting: Family Medicine

## 2017-01-13 ENCOUNTER — Ambulatory Visit (INDEPENDENT_AMBULATORY_CARE_PROVIDER_SITE_OTHER): Payer: BLUE CROSS/BLUE SHIELD | Admitting: Family Medicine

## 2017-01-13 VITALS — BP 122/62 | HR 74 | Temp 98.6°F | Resp 18 | Ht 61.0 in | Wt 130.4 lb

## 2017-01-13 DIAGNOSIS — M25512 Pain in left shoulder: Secondary | ICD-10-CM | POA: Diagnosis not present

## 2017-01-13 DIAGNOSIS — Z0001 Encounter for general adult medical examination with abnormal findings: Secondary | ICD-10-CM | POA: Diagnosis not present

## 2017-01-13 DIAGNOSIS — Z1239 Encounter for other screening for malignant neoplasm of breast: Secondary | ICD-10-CM

## 2017-01-13 DIAGNOSIS — E079 Disorder of thyroid, unspecified: Secondary | ICD-10-CM

## 2017-01-13 DIAGNOSIS — E782 Mixed hyperlipidemia: Secondary | ICD-10-CM | POA: Diagnosis not present

## 2017-01-13 DIAGNOSIS — Z23 Encounter for immunization: Secondary | ICD-10-CM | POA: Diagnosis not present

## 2017-01-13 DIAGNOSIS — I471 Supraventricular tachycardia: Secondary | ICD-10-CM

## 2017-01-13 DIAGNOSIS — Z0000 Encounter for general adult medical examination without abnormal findings: Secondary | ICD-10-CM

## 2017-01-13 LAB — T4, FREE: Free T4: 0.79 ng/dL (ref 0.60–1.60)

## 2017-01-13 MED ORDER — LEVOTHYROXINE SODIUM 25 MCG PO TABS: 25.0000 ug | ORAL_TABLET | Freq: Every day | ORAL | 3 refills | Status: DC

## 2017-01-13 MED ORDER — MELOXICAM 15 MG PO TABS
15.0000 mg | ORAL_TABLET | Freq: Every day | ORAL | 2 refills | Status: DC | PRN
Start: 2017-01-13 — End: 2017-02-22

## 2017-01-13 NOTE — Patient Instructions (Addendum)
Lidocaine gel, Aspereme, Icy Hot and Salon Pas Tylenol/Acetaminophen ES 500 mg tabs, 1-2 tabs twice (3000 mg/24 hours) Preventive Care 18-39 Years, Female Preventive care refers to lifestyle choices and visits with your health care provider that can promote health and wellness. What does preventive care include?  A yearly physical exam. This is also called an annual well check.  Dental exams once or twice a year.  Routine eye exams. Ask your health care provider how often you should have your eyes checked.  Personal lifestyle choices, including: ? Daily care of your teeth and gums. ? Regular physical activity. ? Eating a healthy diet. ? Avoiding tobacco and drug use. ? Limiting alcohol use. ? Practicing safe sex. ? Taking vitamin and mineral supplements as recommended by your health care provider. What happens during an annual well check? The services and screenings done by your health care provider during your annual well check will depend on your age, overall health, lifestyle risk factors, and family history of disease. Counseling Your health care provider may ask you questions about your:  Alcohol use.  Tobacco use.  Drug use.  Emotional well-being.  Home and relationship well-being.  Sexual activity.  Eating habits.  Work and work Statistician.  Method of birth control.  Menstrual cycle.  Pregnancy history.  Screening You may have the following tests or measurements:  Height, weight, and BMI.  Diabetes screening. This is done by checking your blood sugar (glucose) after you have not eaten for a while (fasting).  Blood pressure.  Lipid and cholesterol levels. These may be checked every 5 years starting at age 73.  Skin check.  Hepatitis C blood test.  Hepatitis B blood test.  Sexually transmitted disease (STD) testing.  BRCA-related cancer screening. This may be done if you have a family history of breast, ovarian, tubal, or peritoneal  cancers.  Pelvic exam and Pap test. This may be done every 3 years starting at age 87. Starting at age 39, this may be done every 5 years if you have a Pap test in combination with an HPV test.  Discuss your test results, treatment options, and if necessary, the need for more tests with your health care provider. Vaccines Your health care provider may recommend certain vaccines, such as:  Influenza vaccine. This is recommended every year.  Tetanus, diphtheria, and acellular pertussis (Tdap, Td) vaccine. You may need a Td booster every 10 years.  Varicella vaccine. You may need this if you have not been vaccinated.  HPV vaccine. If you are 49 or younger, you may need three doses over 6 months.  Measles, mumps, and rubella (MMR) vaccine. You may need at least one dose of MMR. You may also need a second dose.  Pneumococcal 13-valent conjugate (PCV13) vaccine. You may need this if you have certain conditions and were not previously vaccinated.  Pneumococcal polysaccharide (PPSV23) vaccine. You may need one or two doses if you smoke cigarettes or if you have certain conditions.  Meningococcal vaccine. One dose is recommended if you are age 28-21 years and a first-year college student living in a residence hall, or if you have one of several medical conditions. You may also need additional booster doses.  Hepatitis A vaccine. You may need this if you have certain conditions or if you travel or work in places where you may be exposed to hepatitis A.  Hepatitis B vaccine. You may need this if you have certain conditions or if you travel or work in places where  you may be exposed to hepatitis B.  Haemophilus influenzae type b (Hib) vaccine. You may need this if you have certain risk factors.  Talk to your health care provider about which screenings and vaccines you need and how often you need them. This information is not intended to replace advice given to you by your health care provider.  Make sure you discuss any questions you have with your health care provider. Document Released: 03/29/2001 Document Revised: 10/21/2015 Document Reviewed: 12/02/2014 Elsevier Interactive Patient Education  2017 [employer].

## 2017-01-13 NOTE — Progress Notes (Addendum)
Subjective:  I acted as a Education administrator for Dr. Charlett Blake. [place], [place]  Patient ID: April Jackson, female    DOB: November 26, 1975, 41 y.o.   MRN: [id number]  No chief complaint on file.   HPI  Patient is in today for an annual exam and follow up on hyperlipidemia and SVT. She feels well today but notes a rare episode of SVT without associated symptoms. She is having left shoulder pain for the past couple of months. denies any fall or trauma. No radicular symptoms. Is eating a heart healthy diet and stays active with exercise. Denies CP/palp/SOB/HA/congestion/fevers/GI or GU c/o. Taking meds as prescribed  Patient Care Team: [name], MD as PCP - General (Family Medicine)   Past Medical History:  Diagnosis Date  . Allergy    childhood allergies, hives.   . Anemia 04/15/2016  . Back pain 09/28/2014  . Back pain affecting pregnancy, antepartum 09/28/2014  . Cervical cancer screening 01/27/2015   Menarche at 11 Regular and moderate flow No history of abnormal pap in past No sexual activity G0P0, s/p Nohistory of abnormal MGM No concerns today No gyn surgeries  . FH: breast cancer 09/28/2014  . Hyperlipidemia, mixed 09/28/2014  . Hypertension   . Pruritus 09/28/2014  . Reactive airway disease 04/15/2016  . Seasonal allergies   . SVT (supraventricular tachycardia) ([place])   . Thyroid disease     History reviewed. No pertinent surgical history.  Family History  Problem Relation Age of Onset  . Heart disease Father   . Pulmonary fibrosis Father        pneumonia  . Cancer Sister 91       cervical cancer  . Hypertension Sister   . Hyperlipidemia Sister   . Gout Sister   . Diabetes Mother   . Hypertension Mother   . Hyperlipidemia Mother   . Cancer Mother        cervical cancer  . Diabetes Maternal Grandmother   . Stroke Paternal Grandmother   . Heart disease Paternal Grandfather        MI  . Hypertension Sister   . Cholelithiasis Sister   . Thyroid disease Sister   . Ovarian cysts Sister     . Cancer Sister        breast  . Asthma Sister     Social History   Socioeconomic History  . Marital status: Single    Spouse name: Not on file  . Number of children: 0  . Years of education: Not on file  . Highest education level: Not on file  Social Needs  . Financial resource strain: Not on file  . Food insecurity - worry: Not on file  . Food insecurity - inability: Not on file  . Transportation needs - medical: Not on file  . Transportation needs - non-medical: Not on file  Occupational History  . Occupation: DENTIST    Employer: SELF EMPLOYED  Tobacco Use  . Smoking status: Never Smoker  . Smokeless tobacco: Never Used  Substance and Sexual Activity  . Alcohol use: No  . Drug use: No  . 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 Not on file  Social History Narrative  . Not on file    Outpatient Medications Prior to Visit  Medication Sig Dispense Refill  . albuterol (PROVENTIL HFA;VENTOLIN HFA) 108 (90 Base) MCG/ACT inhaler Inhale 2 puffs into the lungs every 6 (six)  hours as needed for wheezing or shortness of breath. 1 Inhaler 2  . aspirin 325 MG tablet Take 325 mg by mouth daily.    Marland Kitchen atenolol (TENORMIN) 50 MG tablet Take 1.5 tablets (75 mg total) by mouth daily. (Patient taking differently: Take 50 mg by mouth daily. ) 135 tablet 1  . cetirizine (ZYRTEC) 10 MG tablet Take 10 mg by mouth daily as needed for allergies.     . cyclobenzaprine (FLEXERIL) 10 MG tablet Take 10 mg by mouth 3 (three) times daily as needed for muscle spasms.    Marland Kitchen diltiazem (CARDIZEM) 30 MG tablet Take 1 tablet by mouth as needed every four to six hours for sustained heart rates greater than 100 bpm.    . hydrochlorothiazide (HYDRODIURIL) 25 MG tablet Take 25 mg by mouth daily as needed (fluid/hypertension).    . Loratadine (CLARITIN) 10 MG CAPS Take 1 capsule by mouth daily as needed (alergies).     Marland Kitchen omeprazole  (PRILOSEC) 20 MG capsule Take 20 mg by mouth daily.    Marland Kitchen triamcinolone cream (KENALOG) 0.1 % Apply topically 2 (two) times daily. Apply topically 30 g 3  . benzonatate (TESSALON) 100 MG capsule Take 1 capsule (100 mg total) by mouth 3 (three) times daily as needed for cough. 20 capsule 0  . diltiazem (CARDIZEM) 120 MG tablet Take 120 mg by mouth at bedtime.      No facility-administered medications prior to visit.     Allergies  Allergen Reactions  . Amoxicillin Other (Ever[place] Comments)    Rash  . Nitrous Oxide Other ([place]abers Comments)    Paradoxical response-got very hyper  . Penicillins Other (Papadopoulos Comments)    Rash  . Sodium Chloride Swelling    And increased BP    Review of Systems  Constitutional: Negative.  Negative for fever and malaise/fatigue.  HENT: Negative for congestion.        Mild enlargement or right thyroid lobe  Eyes: Negative for blurred vision.  Respiratory: Negative for cough and shortness of breath.   Cardiovascular: Negative for chest pain, palpitations and leg swelling.  Gastrointestinal: Negative for vomiting.  Musculoskeletal: Positive for joint pain. Negative for back pain.  Skin: Negative for rash.  Neurological: Negative for loss of consciousness and headaches.       Objective:    Physical Exam  Constitutional: She is oriented to person, place, and time. She appears well-developed and well-nourished. No distress.  HENT:  Head: Normocephalic and atraumatic.  Eyes: Conjunctivae are normal.  Neck: Normal range of motion. No thyromegaly present.  Cardiovascular: Normal rate and regular rhythm.  Pulmonary/Chest: Effort normal and breath sounds normal. She has no wheezes.  Abdominal: Soft. Bowel sounds are normal. There is no tenderness.  Musculoskeletal: Normal range of motion. She exhibits no edema or deformity.  Neurological: She is alert and oriented to person, place, and time.  Skin: Skin is warm and dry. She is not diaphoretic.  Psychiatric: She has  a normal mood and affect.    BP 122/62 (BP Location: Left Arm, Patient Position: Sitting, Cuff Size: Normal)   Pulse 74   Temp 98.6 F (37 C) (Oral)   Resp 18   Ht 5' 1"  (1.549 m)   Wt 130 lb 6.4 oz (59.1 kg)   SpO2 98%   BMI 24.64 kg/m  Wt Readings from Last 3 Encounters:  01/13/17 130 lb 6.4 oz (59.1 kg)  04/15/16 127 lb 12.8 oz (58 kg)  10/29/15 126 lb 6.4 oz (  57.3 kg)   BP Readings from Last 3 Encounters:  01/13/17 122/62  04/15/16 115/72  10/29/15 92/60     Immunization History  Administered Date(s) Administered  . Influenza-Unspecified 11/17/2014  . Tdap 01/13/2017    Health Maintenance  Topic Date Due  . INFLUENZA VACCINE  05/15/2018 (Originally 09/14/2016)  . PAP SMEAR  01/26/2018  . TETANUS/TDAP  01/14/2027  . HIV Screening  Completed    Lab Results  Component Value Date   WBC 4.4 12/30/2016   HGB 12.2 12/30/2016   HCT 36.2 12/30/2016   PLT 284.0 12/30/2016   GLUCOSE 88 12/30/2016   CHOL 203 (H) 12/30/2016   TRIG 159.0 (H) 12/30/2016   HDL 57.00 12/30/2016   LDLCALC 115 (H) 12/30/2016   ALT 11 12/30/2016   AST 19 12/30/2016   NA 138 12/30/2016   K 3.9 12/30/2016   CL 101 12/30/2016   CREATININE 0.57 12/30/2016   BUN 9 12/30/2016   CO2 29 12/30/2016   TSH 3.91 12/30/2016    Lab Results  Component Value Date   TSH 3.91 12/30/2016   Lab Results  Component Value Date   WBC 4.4 12/30/2016   HGB 12.2 12/30/2016   HCT 36.2 12/30/2016   MCV 91.1 12/30/2016   PLT 284.0 12/30/2016   Lab Results  Component Value Date   NA 138 12/30/2016   K 3.9 12/30/2016   CO2 29 12/30/2016   GLUCOSE 88 12/30/2016   BUN 9 12/30/2016   CREATININE 0.57 12/30/2016   BILITOT 0.4 12/30/2016   ALKPHOS 42 12/30/2016   AST 19 12/30/2016   ALT 11 12/30/2016   PROT 7.4 12/30/2016   ALBUMIN 4.1 12/30/2016   CALCIUM 9.2 12/30/2016   ANIONGAP 10 09/08/2015   GFR 122.99 12/30/2016   Lab Results  Component Value Date   CHOL 203 (H) 12/30/2016   Lab Results    Component Value Date   HDL 57.00 12/30/2016   Lab Results  Component Value Date   LDLCALC 115 (H) 12/30/2016   Lab Results  Component Value Date   TRIG 159.0 (H) 12/30/2016   Lab Results  Component Value Date   CHOLHDL 4 12/30/2016   No results found for: HGBA1C       Assessment & Plan:   Problem List Items Addressed This Visit    Thyroid disease    Mild enlargement of right lobe noted on palpation. Will proceed with labs and ultrasound      Relevant Orders   T4, free (Completed)   TSH   T4, free   US SOFT TISSUE HEAD & NECK (NON-THYROID)   Hyperlipidemia, mixed    Encouraged heart healthy diet, increase exercise, avoid trans fats, consider a krill oil cap daily      SVT (supraventricular tachycardia) ([facility])    No significant recent flares.       Preventative health care    Patient encouraged to maintain heart healthy diet, regular exercise, adequate sleep. Consider daily probiotics. Take medications as prescribed. MGM ordered today. Tdap given      Acute pain of left shoulder - Primary    Referred to Sports med for further evaluation. Try Lidocaine gel and Tylenol bid.       Relevant Orders   Ambulatory referral to Sports Medicine    Other Visit Diagnoses    Breast cancer screening       Relevant Orders   MM SCREENING BREAST TOMO BILATERAL   Need for Tdap vaccination  Relevant Orders   Tdap vaccine greater than or equal to 7yo IM (Completed)      I have discontinued Makela Niziolek's benzonatate. I am also having her start on meloxicam. Additionally, I am having her maintain her Loratadine, cetirizine, cyclobenzaprine, atenolol, aspirin, hydrochlorothiazide, omeprazole, diltiazem, albuterol, and triamcinolone cream.  Meds ordered this encounter  Medications  . meloxicam (MOBIC) 15 MG tablet    Sig: Take 1 tablet (15 mg total) by mouth daily as needed for pain.    Dispense:  30 tablet    Refill:  2    CMA served as scribe during this visit.  History, Physical and Plan performed by medical provider. Documentation and orders reviewed and attested to.  Penni Homans, MD

## 2017-01-15 DIAGNOSIS — Z1211 Encounter for screening for malignant neoplasm of colon: Secondary | ICD-10-CM | POA: Insufficient documentation

## 2017-01-15 DIAGNOSIS — Z0000 Encounter for general adult medical examination without abnormal findings: Secondary | ICD-10-CM | POA: Insufficient documentation

## 2017-01-15 DIAGNOSIS — M25512 Pain in left shoulder: Secondary | ICD-10-CM | POA: Insufficient documentation

## 2017-01-15 NOTE — Assessment & Plan Note (Signed)
Referred to Sports med for further evaluation. Try Lidocaine gel and Tylenol bid.

## 2017-01-15 NOTE — Assessment & Plan Note (Signed)
Encouraged heart healthy diet, increase exercise, avoid trans fats, consider a krill oil cap daily 

## 2017-01-15 NOTE — Assessment & Plan Note (Signed)
No significant recent flares.

## 2017-01-15 NOTE — Assessment & Plan Note (Signed)
Mild enlargement of right lobe noted on palpation. Will proceed with labs and ultrasound

## 2017-01-15 NOTE — Assessment & Plan Note (Addendum)
Patient encouraged to maintain heart healthy diet, regular exercise, adequate sleep. Consider daily probiotics. Take medications as prescribed. MGM ordered today. Tdap given

## 2017-01-20 ENCOUNTER — Other Ambulatory Visit: Payer: Self-pay | Admitting: Family Medicine

## 2017-01-20 ENCOUNTER — Ambulatory Visit (HOSPITAL_BASED_OUTPATIENT_CLINIC_OR_DEPARTMENT_OTHER)
Admission: RE | Admit: 2017-01-20 | Discharge: 2017-01-20 | Disposition: A | Discharge status: Home / Self Care | Payer: BLUE CROSS/BLUE SHIELD | Source: Ambulatory Visit | Attending: Family Medicine | Admitting: Family Medicine

## 2017-01-20 DIAGNOSIS — Z1231 Encounter for screening mammogram for malignant neoplasm of breast: Secondary | ICD-10-CM | POA: Diagnosis not present

## 2017-01-20 DIAGNOSIS — Z1239 Encounter for other screening for malignant neoplasm of breast: Secondary | ICD-10-CM

## 2017-01-20 DIAGNOSIS — E079 Disorder of thyroid, unspecified: Secondary | ICD-10-CM

## 2017-01-20 DIAGNOSIS — E039 Hypothyroidism, unspecified: Secondary | ICD-10-CM | POA: Diagnosis not present

## 2017-01-27 ENCOUNTER — Ambulatory Visit: Payer: BLUE CROSS/BLUE SHIELD | Admitting: Family Medicine

## 2017-01-27 ENCOUNTER — Encounter: Payer: Self-pay | Admitting: Family Medicine

## 2017-01-27 DIAGNOSIS — M25512 Pain in left shoulder: Secondary | ICD-10-CM

## 2017-01-27 NOTE — Patient Instructions (Signed)
You have rotator cuff impingement Try to avoid painful activities (overhead activities, lifting with extended arm) as much as possible. Meloxicam 7.5 mg daily with food for pain and inflammation (1/2 of your 55m tablet). Can take tylenol in addition to this. Subacromial injection may be beneficial to help with pain and to decrease inflammation. Consider physical therapy with transition to home exercise program. Do home exercise program with theraband and scapular stabilization exercises daily 3 sets of 10 once a day. If not improving at follow-up we will consider further imaging, injection, physical therapy, and/or nitro patches. Follow up with me in 5-6 weeks.

## 2017-01-28 ENCOUNTER — Encounter: Payer: Self-pay | Admitting: Family Medicine

## 2017-01-28 NOTE — Assessment & Plan Note (Signed)
2/2 rotator cuff impingement.  Start meloxicam 7.18m daily with food.  Start home exercise program which was reviewed today.  Icing, tylenol if needed also.  Consider imaging, injection, PT, nitro patches if not improving as expected.  F/u in 5-6 weeks.

## 2017-01-28 NOTE — Progress Notes (Signed)
PCP and consultation requested by: [name], MD  Subjective:   HPI: Patient is a 41 y.o. female here for left shoulder pain.  Patient denies known injury or trauma. She states for about 6-8 weeks she's had left lateral shoulder pain. Pain is 5-6/10, sharp. Waking her up at night. Tried tylenol at bedtime. No prior injuries. Is right handed. No skin changes, numbness.  Past Medical History:  Diagnosis Date  . Allergy    childhood allergies, hives.   . Anemia 04/15/2016  . Back pain 09/28/2014  . Back pain affecting pregnancy, antepartum 09/28/2014  . Cervical cancer screening 01/27/2015   Menarche at 11 Regular and moderate flow No history of abnormal pap in past No sexual activity G0P0, s/p Nohistory of abnormal MGM No concerns today No gyn surgeries  . FH: breast cancer 09/28/2014  . Hyperlipidemia, mixed 09/28/2014  . Hypertension   . Pruritus 09/28/2014  . Reactive airway disease 04/15/2016  . Seasonal allergies   . SVT (supraventricular tachycardia) ([place])   . Thyroid disease     Current Outpatient Medications on File Prior to Visit  Medication Sig Dispense Refill  . albuterol (PROVENTIL HFA;VENTOLIN HFA) 108 (90 Base) MCG/ACT inhaler Inhale 2 puffs into the lungs every 6 (six) hours as needed for wheezing or shortness of breath. 1 Inhaler 2  . aspirin 325 MG tablet Take 325 mg by mouth daily.    Marland Kitchen atenolol (TENORMIN) 50 MG tablet Take 1.5 tablets (75 mg total) by mouth daily. (Patient taking differently: Take 50 mg by mouth daily. ) 135 tablet 1  . cetirizine (ZYRTEC) 10 MG tablet Take 10 mg by mouth daily as needed for allergies.     . cyclobenzaprine (FLEXERIL) 10 MG tablet Take 10 mg by mouth 3 (three) times daily as needed for muscle spasms.    Marland Kitchen diltiazem (CARDIZEM) 30 MG tablet Take 1 tablet by mouth as needed every four to six hours for sustained heart rates greater than 100 bpm.    . hydrochlorothiazide (HYDRODIURIL) 25 MG tablet Take 25 mg by mouth daily as  needed (fluid/hypertension).    Marland Kitchen levothyroxine (SYNTHROID) 25 MCG tablet Take 1 tablet (25 mcg total) by mouth daily before breakfast. 30 tablet 3  . Loratadine (CLARITIN) 10 MG CAPS Take 1 capsule by mouth daily as needed (alergies).     . meloxicam (MOBIC) 15 MG tablet Take 1 tablet (15 mg total) by mouth daily as needed for pain. 30 tablet 2  . omeprazole (PRILOSEC) 20 MG capsule Take 20 mg by mouth daily.    Marland Kitchen triamcinolone cream (KENALOG) 0.1 % Apply topically 2 (two) times daily. Apply topically 30 g 3   No current facility-administered medications on file prior to visit.     History reviewed. No pertinent surgical history.  Allergies  Allergen Reactions  . Amoxicillin Other ([place] Comments)    Rash  . Nitrous Oxide Other ([place] Comments)    Paradoxical response-got very hyper  . Penicillins Other ([place] Comments)    Rash  . Sodium Chloride Swelling    And increased BP    Social History   Socioeconomic History  . Marital status: Single    Spouse name: Not on file  . Number of children: 0  . Years of education: Not on file  . Highest education level: Not on file  Social Needs  . Financial resource strain: Not on file  . Food insecurity - worry: Not on file  . Food insecurity - inability: Not  on file  . Transportation needs - medical: Not on file  . Transportation needs - non-medical: Not on file  Occupational History  . Occupation: DENTIST    Employer: SELF EMPLOYED  Tobacco Use  . Smoking status: Never Smoker  . Smokeless tobacco: Never Used  Substance and Sexual Activity  . Alcohol use: No  . Drug use: No  . 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 Not on file  Social History Narrative  . Not on file    Family History  Problem Relation Age of Onset  . Heart disease Father   . Pulmonary fibrosis Father        pneumonia  . Cancer Sister 35       cervical cancer  . Hypertension  Sister   . Hyperlipidemia Sister   . Gout Sister   . Diabetes Mother   . Hypertension Mother   . Hyperlipidemia Mother   . Cancer Mother        cervical cancer  . Diabetes Maternal Grandmother   . Stroke Paternal Grandmother   . Heart disease Paternal Grandfather        MI  . Hypertension Sister   . Cholelithiasis Sister   . Thyroid disease Sister   . Ovarian cysts Sister   . Cancer Sister        breast  . Asthma Sister     BP 120/86   Ht 5' 1"  (1.549 m)   Wt 128 lb (58.1 kg)   BMI 24.19 kg/m   Review of Systems: [place] HPI above.     Objective:  Physical Exam:  Gen: NAD, comfortable in exam room  Left shoulder: No swelling, ecchymoses.  No gross deformity. No TTP. FROM with mild painful arc. Positive Hawkins, Neers. Negative Yergasons. Strength 5/5 with empty can and resisted internal/external rotation.  Mild pain with all three motions. Negative apprehension. NV intact distally.  Right shoulder: No swelling, ecchymoses.  No gross deformity. No TTP. FROM. Strength 5/5 with empty can and resisted internal/external rotation. NV intact distally.   Assessment & Plan:  1. Left shoulder pain - 2/2 rotator cuff impingement.  Start meloxicam 7.56m daily with food.  Start home exercise program which was reviewed today.  Icing, tylenol if needed also.  Consider imaging, injection, PT, nitro patches if not improving as expected.  F/u in 5-6 weeks.

## 2017-02-22 ENCOUNTER — Other Ambulatory Visit: Payer: Self-pay

## 2017-02-22 MED ORDER — MELOXICAM 15 MG PO TABS: 15.0000 mg | ORAL_TABLET | Freq: Every day | ORAL | 2 refills | Status: DC | PRN

## 2017-03-03 ENCOUNTER — Encounter: Payer: Self-pay | Admitting: Family Medicine

## 2017-03-03 ENCOUNTER — Ambulatory Visit: Payer: BLUE CROSS/BLUE SHIELD | Admitting: Family Medicine

## 2017-03-03 DIAGNOSIS — M25512 Pain in left shoulder: Secondary | ICD-10-CM

## 2017-03-03 MED ORDER — NITROGLYCERIN 0.2 MG/HR TD PT24: MEDICATED_PATCH | TRANSDERMAL | 1 refills | Status: DC

## 2017-03-03 NOTE — Patient Instructions (Addendum)
You have rotator cuff impingement Try to avoid painful activities (overhead activities, lifting with extended arm) as much as possible. Meloxicam 7.5 mg daily with food for pain and inflammation (1/2 of your 22m tablet). Can take tylenol in addition to this. Add nitro patches 1/4th patch to affected shoulder, change daily. Subacromial injection may be beneficial to help with pain and to decrease inflammation. Consider physical therapy with transition to home exercise program. Do home exercise program with theraband and scapular stabilization exercises daily 3 sets of 10 once a day. Arm circles, swings, wall walking, and table slides as well for motion. If not improving at follow-up we will consider further imaging, injection, physical therapy. Follow up with me in 6 weeks.

## 2017-03-04 ENCOUNTER — Encounter: Payer: Self-pay | Admitting: Family Medicine

## 2017-03-04 NOTE — Progress Notes (Signed)
PCP and consultation requested by: [name], MD  Subjective:   HPI: Patient is a 42 y.o. female here for left shoulder pain.  12/14: Patient denies known injury or trauma. She states for about 6-8 weeks she's had left lateral shoulder pain. Pain is 5-6/10, sharp. Waking her up at night. Tried tylenol at bedtime. No prior injuries. Is right handed. No skin changes, numbness.  1/18: Patient reports she feels a little better. Pain is still at 4/10 and sharp. Worse with overhead motions, reaching. No longer waking up at night. Taking mobic and doing home exercise program. No skin changes, numbness.  Past Medical History:  Diagnosis Date  . Allergy    childhood allergies, hives.   . Anemia 04/15/2016  . Back pain 09/28/2014  . Back pain affecting pregnancy, antepartum 09/28/2014  . Cervical cancer screening 01/27/2015   Menarche at 11 Regular and moderate flow No history of abnormal pap in past No sexual activity G0P0, s/p Nohistory of abnormal MGM No concerns today No gyn surgeries  . FH: breast cancer 09/28/2014  . Hyperlipidemia, mixed 09/28/2014  . Hypertension   . Pruritus 09/28/2014  . Reactive airway disease 04/15/2016  . Seasonal allergies   . SVT (supraventricular tachycardia) ([place])   . Thyroid disease     Current Outpatient Medications on File Prior to Visit  Medication Sig Dispense Refill  . albuterol (PROVENTIL HFA;VENTOLIN HFA) 108 (90 Base) MCG/ACT inhaler Inhale 2 puffs into the lungs every 6 (six) hours as needed for wheezing or shortness of breath. 1 Inhaler 2  . aspirin 325 MG tablet Take 325 mg by mouth daily.    Marland Kitchen atenolol (TENORMIN) 50 MG tablet Take 1.5 tablets (75 mg total) by mouth daily. (Patient taking differently: Take 50 mg by mouth daily. ) 135 tablet 1  . cetirizine (ZYRTEC) 10 MG tablet Take 10 mg by mouth daily as needed for allergies.     . cyclobenzaprine (FLEXERIL) 10 MG tablet Take 10 mg by mouth 3 (three) times daily as needed for muscle  spasms.    Marland Kitchen diltiazem (CARDIZEM) 30 MG tablet Take 1 tablet by mouth as needed every four to six hours for sustained heart rates greater than 100 bpm.    . hydrochlorothiazide (HYDRODIURIL) 25 MG tablet Take 25 mg by mouth daily as needed (fluid/hypertension).    Marland Kitchen levothyroxine (SYNTHROID) 25 MCG tablet Take 1 tablet (25 mcg total) by mouth daily before breakfast. 30 tablet 3  . Loratadine (CLARITIN) 10 MG CAPS Take 1 capsule by mouth daily as needed (alergies).     . meloxicam (MOBIC) 15 MG tablet Take 1 tablet (15 mg total) by mouth daily as needed for pain. 30 tablet 2  . omeprazole (PRILOSEC) 20 MG capsule Take 20 mg by mouth daily.    Marland Kitchen triamcinolone cream (KENALOG) 0.1 % Apply topically 2 (two) times daily. Apply topically 30 g 3   No current facility-administered medications on file prior to visit.     History reviewed. No pertinent surgical history.  Allergies  Allergen Reactions  . Amoxicillin Other ([place] Comments)    Rash  . Nitrous Oxide Other ([place] Comments)    Paradoxical response-got very hyper  . Penicillins Other ([place] Comments)    Rash  . Sodium Chloride Swelling    And increased BP    Social History   Socioeconomic History  . Marital status: Single    Spouse name: Not on file  . Number of children: 0  . Years of  education: Not on file  . Highest education level: Not on file  Social Needs  . Financial resource strain: Not on file  . Food insecurity - worry: Not on file  . Food insecurity - inability: Not on file  . Transportation needs - medical: Not on file  . Transportation needs - non-medical: Not on file  Occupational History  . Occupation: DENTIST    Employer: SELF EMPLOYED  Tobacco Use  . Smoking status: Never Smoker  . Smokeless tobacco: Never Used  Substance and Sexual Activity  . Alcohol use: No  . Drug use: No  . 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 Not on file   Social History Narrative  . Not on file    Family History  Problem Relation Age of Onset  . Heart disease Father   . Pulmonary fibrosis Father        pneumonia  . Cancer Sister 38       cervical cancer  . Hypertension Sister   . Hyperlipidemia Sister   . Gout Sister   . Diabetes Mother   . Hypertension Mother   . Hyperlipidemia Mother   . Cancer Mother        cervical cancer  . Diabetes Maternal Grandmother   . Stroke Paternal Grandmother   . Heart disease Paternal Grandfather        MI  . Hypertension Sister   . Cholelithiasis Sister   . Thyroid disease Sister   . Ovarian cysts Sister   . Cancer Sister        breast  . Asthma Sister     BP 123/80   Pulse 80   Ht 5' 1"  (1.549 m)   Wt 127 lb (57.6 kg)   BMI 24.00 kg/m   Review of Systems: [place] HPI above.     Objective:  Physical Exam:  Gen: NAD, comfortable in exam room.  Left shoulder: No swelling, ecchymoses.  No gross deformity. No TTP. FROM with painful arc. Positive Hawkins, Neers. Negative Yergasons. Strength 5/5 with empty can and resisted internal/external rotation.  Mild pain all motions. Negative apprehension. NV intact distally.   Assessment & Plan:  1. Left shoulder pain - 2/2 rotator cuff impingement.  Only mild improvement to this point.  She will continue with home exercises and meloxicam.  Add nitro patches.  Consider physical therapy.  F/u in 6 weeks.

## 2017-03-04 NOTE — Assessment & Plan Note (Signed)
2/2 rotator cuff impingement.  Only mild improvement to this point.  She will continue with home exercises and meloxicam.  Add nitro patches.  Consider physical therapy.  F/u in 6 weeks.

## 2017-04-07 ENCOUNTER — Other Ambulatory Visit: Payer: Self-pay

## 2017-04-07 MED ORDER — LEVOTHYROXINE SODIUM 25 MCG PO TABS: 25.0000 ug | ORAL_TABLET | Freq: Every day | ORAL | 3 refills | Status: DC

## 2017-04-14 ENCOUNTER — Encounter: Payer: Self-pay | Admitting: Family Medicine

## 2017-04-14 ENCOUNTER — Other Ambulatory Visit (INDEPENDENT_AMBULATORY_CARE_PROVIDER_SITE_OTHER): Payer: BLUE CROSS/BLUE SHIELD

## 2017-04-14 ENCOUNTER — Ambulatory Visit: Payer: BLUE CROSS/BLUE SHIELD | Admitting: Family Medicine

## 2017-04-14 DIAGNOSIS — M25512 Pain in left shoulder: Secondary | ICD-10-CM

## 2017-04-14 DIAGNOSIS — I471 Supraventricular tachycardia: Secondary | ICD-10-CM | POA: Diagnosis not present

## 2017-04-14 DIAGNOSIS — E782 Mixed hyperlipidemia: Secondary | ICD-10-CM | POA: Diagnosis not present

## 2017-04-14 DIAGNOSIS — E079 Disorder of thyroid, unspecified: Secondary | ICD-10-CM | POA: Diagnosis not present

## 2017-04-14 DIAGNOSIS — G4733 Obstructive sleep apnea (adult) (pediatric): Secondary | ICD-10-CM | POA: Diagnosis not present

## 2017-04-14 DIAGNOSIS — R002 Palpitations: Secondary | ICD-10-CM | POA: Diagnosis not present

## 2017-04-14 LAB — T4, FREE: Free T4: 0.65 ng/dL (ref 0.60–1.60)

## 2017-04-14 LAB — TSH: TSH: 3.64 u[IU]/mL (ref 0.35–4.50)

## 2017-04-14 NOTE — Progress Notes (Signed)
PCP and consultation requested by: [name], MD  Subjective:   HPI: Patient is a 42 y.o. female here for left shoulder pain.  12/14: Patient denies known injury or trauma. She states for about 6-8 weeks she's had left lateral shoulder pain. Pain is 5-6/10, sharp. Waking her up at night. Tried tylenol at bedtime. No prior injuries. Is right handed. No skin changes, numbness.  1/18: Patient reports she feels a little better. Pain is still at 4/10 and sharp. Worse with overhead motions, reaching. No longer waking up at night. Taking mobic and doing home exercise program. No skin changes, numbness.  3/1: Patient reports she's improving. Taking mobic as needed now. Nitroglycerin has been helping but not taking every day. Sleep is improving. Doing home exercises. Pain level 0/10 currently. No skin changes, numbness.  Past Medical History:  Diagnosis Date  . Allergy    childhood allergies, hives.   . Anemia 04/15/2016  . Back pain 09/28/2014  . Back pain affecting pregnancy, antepartum 09/28/2014  . Cervical cancer screening 01/27/2015   Menarche at 11 Regular and moderate flow No history of abnormal pap in past No sexual activity G0P0, s/p Nohistory of abnormal MGM No concerns today No gyn surgeries  . FH: breast cancer 09/28/2014  . Hyperlipidemia, mixed 09/28/2014  . Hypertension   . Pruritus 09/28/2014  . Reactive airway disease 04/15/2016  . Seasonal allergies   . SVT (supraventricular tachycardia) ([place])   . Thyroid disease     Current Outpatient Medications on File Prior to Visit  Medication Sig Dispense Refill  . albuterol (PROVENTIL HFA;VENTOLIN HFA) 108 (90 Base) MCG/ACT inhaler Inhale 2 puffs into the lungs every 6 (six) hours as needed for wheezing or shortness of breath. 1 Inhaler 2  . aspirin 325 MG tablet Take 325 mg by mouth daily.    Marland Kitchen atenolol (TENORMIN) 50 MG tablet Take 1.5 tablets (75 mg total) by mouth daily. (Patient taking differently: Take 50 mg  by mouth daily. ) 135 tablet 1  . cetirizine (ZYRTEC) 10 MG tablet Take 10 mg by mouth daily as needed for allergies.     . cyclobenzaprine (FLEXERIL) 10 MG tablet Take 10 mg by mouth 3 (three) times daily as needed for muscle spasms.    Marland Kitchen diltiazem (CARDIZEM) 30 MG tablet Take 1 tablet by mouth as needed every four to six hours for sustained heart rates greater than 100 bpm.    . hydrochlorothiazide (HYDRODIURIL) 25 MG tablet Take 25 mg by mouth daily as needed (fluid/hypertension).    Marland Kitchen levothyroxine (SYNTHROID) 25 MCG tablet Take 1 tablet (25 mcg total) by mouth daily before breakfast. 30 tablet 3  . Loratadine (CLARITIN) 10 MG CAPS Take 1 capsule by mouth daily as needed (alergies).     . meloxicam (MOBIC) 15 MG tablet Take 1 tablet (15 mg total) by mouth daily as needed for pain. 30 tablet 2  . nitroGLYCERIN (NITRODUR - DOSED IN MG/24 HR) 0.2 mg/hr patch Apply 1/4th patch to affected shoulder, change daily 30 patch 1  . omeprazole (PRILOSEC) 20 MG capsule Take 20 mg by mouth daily.    Marland Kitchen triamcinolone cream (KENALOG) 0.1 % Apply topically 2 (two) times daily. Apply topically 30 g 3   No current facility-administered medications on file prior to visit.     History reviewed. No pertinent surgical history.  Allergies  Allergen Reactions  . Amoxicillin Other ([place] Comments)    Rash  . Nitrous Oxide Other ([place] Comments)  Paradoxical response-got very hyper  . Penicillins Other ([place] Comments)    Rash  . Sodium Chloride Swelling    And increased BP    Social History   Socioeconomic History  . Marital status: Single    Spouse name: Not on file  . Number of children: 0  . Years of education: Not on file  . Highest education level: Not on file  Social Needs  . Financial resource strain: Not on file  . Food insecurity - worry: Not on file  . Food insecurity - inability: Not on file  . Transportation needs - medical: Not on file  . Transportation needs - non-medical: Not on file   Occupational History  . Occupation: DENTIST    Employer: SELF EMPLOYED  Tobacco Use  . Smoking status: Never Smoker  . Smokeless tobacco: Never Used  Substance and Sexual Activity  . Alcohol use: No  . Drug use: No  . 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 Not on file  Social History Narrative  . Not on file    Family History  Problem Relation Age of Onset  . Heart disease Father   . Pulmonary fibrosis Father        pneumonia  . Cancer Sister 66       cervical cancer  . Hypertension Sister   . Hyperlipidemia Sister   . Gout Sister   . Diabetes Mother   . Hypertension Mother   . Hyperlipidemia Mother   . Cancer Mother        cervical cancer  . Diabetes Maternal Grandmother   . Stroke Paternal Grandmother   . Heart disease Paternal Grandfather        MI  . Hypertension Sister   . Cholelithiasis Sister   . Thyroid disease Sister   . Ovarian cysts Sister   . Cancer Sister        breast  . Asthma Sister     BP 109/80   Pulse 64   Ht 5' 1"  (1.549 m)   Wt 127 lb (57.6 kg)   BMI 24.00 kg/m   Review of Systems: [place] HPI above.     Objective:  Physical Exam:  Gen: NAD, comfortable in exam room.  Left shoulder: No swelling, ecchymoses.  No gross deformity. No TTP. FROM without painful arc. Negative Hawkins, mild positive Neers. Negative Yergasons. Strength 5/5 with empty can and resisted internal/external rotation. Negative apprehension. NV intact distally.   Assessment & Plan:  1. Left shoulder pain - 2/2 rotator cuff impingement.  Improving.  Encouraged using the nitro patches daily for 6 weeks - only using as needed.  Mobic as needed.  Continue home exercises for 6 more weeks, 3 times a week.  F/u in 6 weeks or prn.

## 2017-04-14 NOTE — Assessment & Plan Note (Signed)
2/2 rotator cuff impingement.  Improving.  Encouraged using the nitro patches daily for 6 weeks - only using as needed.  Mobic as needed.  Continue home exercises for 6 more weeks, 3 times a week.  F/u in 6 weeks or prn.

## 2017-04-14 NOTE — Patient Instructions (Signed)
Continue the home exercises 3 times a week for 6 more weeks. Use the nitro patches regularly for 6 more weeks. Meloxicam only if needed - let us know if you need refills on this in the future - if it's still bothering you out to 6 months though I'd want to [place] you back for sure. Follow up with me in 6 weeks or as needed.

## 2017-04-20 ENCOUNTER — Other Ambulatory Visit: Payer: Self-pay | Admitting: *Deleted

## 2017-04-20 MED ORDER — LEVOTHYROXINE SODIUM 25 MCG PO TABS: 25.0000 ug | ORAL_TABLET | Freq: Every day | ORAL | 0 refills | Status: DC

## 2017-05-05 DIAGNOSIS — I471 Supraventricular tachycardia: Secondary | ICD-10-CM | POA: Diagnosis not present

## 2017-06-23 DIAGNOSIS — H0100A Unspecified blepharitis right eye, upper and lower eyelids: Secondary | ICD-10-CM | POA: Diagnosis not present

## 2017-06-23 DIAGNOSIS — H5203 Hypermetropia, bilateral: Secondary | ICD-10-CM | POA: Diagnosis not present

## 2017-07-04 ENCOUNTER — Encounter: Payer: Self-pay | Admitting: Family Medicine

## 2017-07-05 ENCOUNTER — Other Ambulatory Visit: Payer: Self-pay | Admitting: Family Medicine

## 2017-07-05 MED ORDER — BUDESONIDE 32 MCG/ACT NA SUSP: 2.0000 | Freq: Every day | NASAL | 2 refills | Status: AC

## 2017-07-21 ENCOUNTER — Ambulatory Visit: Payer: BLUE CROSS/BLUE SHIELD | Admitting: Family Medicine

## 2017-10-13 ENCOUNTER — Telehealth: Payer: Self-pay | Admitting: Family Medicine

## 2017-10-13 NOTE — Telephone Encounter (Signed)
Called pt to reschedule appt for 10/13/17 and she said she has an appt in Dec so she didn't want to reschedule. [place] is asking if she needs to get labs done a week before her physical on 01/19/18?  Please advise.

## 2017-10-20 ENCOUNTER — Ambulatory Visit: Payer: BLUE CROSS/BLUE SHIELD | Admitting: Family Medicine

## 2017-10-20 DIAGNOSIS — G4733 Obstructive sleep apnea (adult) (pediatric): Secondary | ICD-10-CM | POA: Diagnosis not present

## 2017-10-20 DIAGNOSIS — I471 Supraventricular tachycardia: Secondary | ICD-10-CM | POA: Diagnosis not present

## 2017-10-20 DIAGNOSIS — R002 Palpitations: Secondary | ICD-10-CM | POA: Diagnosis not present

## 2017-10-27 ENCOUNTER — Ambulatory Visit: Payer: BLUE CROSS/BLUE SHIELD | Admitting: Family Medicine

## 2017-11-10 ENCOUNTER — Ambulatory Visit: Payer: BLUE CROSS/BLUE SHIELD | Admitting: Family Medicine

## 2017-11-10 ENCOUNTER — Encounter: Payer: Self-pay | Admitting: Family Medicine

## 2017-11-10 VITALS — BP 113/72 | HR 67 | Ht 61.0 in | Wt 133.0 lb

## 2017-11-10 DIAGNOSIS — M25571 Pain in right ankle and joints of right foot: Secondary | ICD-10-CM | POA: Diagnosis not present

## 2017-11-10 NOTE — Patient Instructions (Signed)
You have posterior tibialis tendinitis/tenosynovitis. Icing 15 minutes at a time 3-4 times a day. Ibuprofen 635m three times a day with food OR aleve 2 tabs twice a day with food for pain and inflammation - take for 7-10 days then as needed. Home exercises with theraband and calf raises 3 sets of 10 just once a day. Arch support is very important (something like spencos, dr. sZoe Lanactive series, superfeet, or our green sports insoles). Follow up with me in 1 month for this but can make appointment sooner if your back is bothering you.

## 2017-11-12 ENCOUNTER — Encounter: Payer: Self-pay | Admitting: Family Medicine

## 2017-11-12 NOTE — Progress Notes (Signed)
PCP: [name], MD  Subjective:   HPI: Patient is a 42 y.o. female here for right ankle pain.  Patient reports she's had about 1.5 months of dull medial right ankle pain. No acute injury or trauma. Pain currently 0/10 at rest. Tried icing and tylenol. No skin changes, numbness.  Past Medical History:  Diagnosis Date  . Allergy    childhood allergies, hives.   . Anemia 04/15/2016  . Back pain 09/28/2014  . Back pain affecting pregnancy, antepartum 09/28/2014  . Cervical cancer screening 01/27/2015   Menarche at 11 Regular and moderate flow No history of abnormal pap in past No sexual activity G0P0, s/p Nohistory of abnormal MGM No concerns today No gyn surgeries  . FH: breast cancer 09/28/2014  . Hyperlipidemia, mixed 09/28/2014  . Hypertension   . Pruritus 09/28/2014  . Reactive airway disease 04/15/2016  . Seasonal allergies   . SVT (supraventricular tachycardia) ([place])   . Thyroid disease     Current Outpatient Medications on File Prior to Visit  Medication Sig Dispense Refill  . albuterol (PROVENTIL HFA;VENTOLIN HFA) 108 (90 Base) MCG/ACT inhaler Inhale 2 puffs into the lungs every 6 (six) hours as needed for wheezing or shortness of breath. 1 Inhaler 2  . aspirin 325 MG tablet Take 325 mg by mouth daily.    Marland Kitchen atenolol (TENORMIN) 50 MG tablet Take 1.5 tablets (75 mg total) by mouth daily. (Patient taking differently: Take 50 mg by mouth daily. ) 135 tablet 1  . budesonide (RHINOCORT AQUA) 32 MCG/ACT nasal spray Place 2 sprays into both nostrils daily. 8.6 g 2  . cetirizine (ZYRTEC) 10 MG tablet Take 10 mg by mouth daily as needed for allergies.     . cyclobenzaprine (FLEXERIL) 10 MG tablet Take 10 mg by mouth 3 (three) times daily as needed for muscle spasms.    Marland Kitchen diltiazem (CARDIZEM) 30 MG tablet Take 1 tablet by mouth as needed every four to six hours for sustained heart rates greater than 100 bpm.    . hydrochlorothiazide (HYDRODIURIL) 25 MG tablet Take 25 mg by mouth daily  as needed (fluid/hypertension).    Marland Kitchen levothyroxine (SYNTHROID) 25 MCG tablet Take 1 tablet (25 mcg total) by mouth daily before breakfast. 90 tablet 0  . Loratadine (CLARITIN) 10 MG CAPS Take 1 capsule by mouth daily as needed (alergies).     . meloxicam (MOBIC) 15 MG tablet Take 1 tablet (15 mg total) by mouth daily as needed for pain. 30 tablet 2  . nitroGLYCERIN (NITRODUR - DOSED IN MG/24 HR) 0.2 mg/hr patch Apply 1/4th patch to affected shoulder, change daily 30 patch 1  . omeprazole (PRILOSEC) 20 MG capsule Take 20 mg by mouth daily.    Marland Kitchen triamcinolone cream (KENALOG) 0.1 % Apply topically 2 (two) times daily. Apply topically 30 g 3   No current facility-administered medications on file prior to visit.     History reviewed. No pertinent surgical history.  Allergies  Allergen Reactions  . Amoxicillin Other ([place] Comments)    Rash  . Nitrous Oxide Other ([place] Comments)    Paradoxical response-got very hyper  . Penicillins Other ([place] Comments)    Rash  . Sodium Chloride Swelling    And increased BP    Social History   Socioeconomic History  . Marital status: Single    Spouse name: Not on file  . Number of children: 0  . Years of education: Not on file  . Highest education level: Not on  file  Occupational History  . Occupation: DENTIST    Employer: SELF EMPLOYED  Social Needs  . Financial resource strain: Not on file  . Food insecurity:    Worry: Not on file    Inability: Not on file  . Transportation needs:    Medical: Not on file    Non-medical: Not on file  Tobacco Use  . Smoking status: Never Smoker  . Smokeless tobacco: Never Used  Substance and Sexual Activity  . Alcohol use: No  . Drug use: No  . 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 Physical activity:    Days per week: Not on file    Minutes per session: Not on file  . Stress: Not on file  Relationships  . Social connections:    Talks  on phone: Not on file    Gets together: Not on file    Attends religious service: Not on file    Active member of club or organization: Not on file    Attends meetings of clubs or organizations: Not on file    Relationship status: Not on file  . Intimate partner violence:    Fear of current or ex partner: Not on file    Emotionally abused: Not on file    Physically abused: Not on file    Forced sexual activity: Not on file  Other Topics Concern  . Not on file  Social History Narrative  . Not on file    Family History  Problem Relation Age of Onset  . Heart disease Father   . Pulmonary fibrosis Father        pneumonia  . Cancer Sister 27       cervical cancer  . Hypertension Sister   . Hyperlipidemia Sister   . Gout Sister   . Diabetes Mother   . Hypertension Mother   . Hyperlipidemia Mother   . Cancer Mother        cervical cancer  . Diabetes Maternal Grandmother   . Stroke Paternal Grandmother   . Heart disease Paternal Grandfather        MI  . Hypertension Sister   . Cholelithiasis Sister   . Thyroid disease Sister   . Ovarian cysts Sister   . Cancer Sister        breast  . Asthma Sister     BP 113/72   Pulse 67   Ht 5' 1"  (1.549 m)   Wt 133 lb (60.3 kg)   BMI 25.13 kg/m   Review of Systems: [place] HPI above.     Objective:  Physical Exam:  Gen: NAD, comfortable in exam room  Right ankle: No gross deformity, swelling, ecchymoses FROM with 5/5 strength but pain with resisted IR> TTP course of post tibialis tendon.  No other tenderness. Negative ant drawer and talar tilt.   Negative syndesmotic compression. Thompsons test negative. NV intact distally.  Left ankle: No deformity. FROM with 5/5 strength. No tenderness to palpation. NVI distally.   MSK u/s right ankle:  Mild tenosynovitis of post tibialis.  No other abnormalities.  Assessment & Plan:  1. Right ankle pain - consistent with posterior tibialis tendinitis/tenosynovitis.  Icing,  ibuprofen or aleve.  Stressed importance of arch support.  Home exercises reviewed.  F/u in 1 month.

## 2017-12-08 ENCOUNTER — Ambulatory Visit: Payer: BLUE CROSS/BLUE SHIELD | Admitting: Family Medicine

## 2017-12-08 ENCOUNTER — Ambulatory Visit (HOSPITAL_BASED_OUTPATIENT_CLINIC_OR_DEPARTMENT_OTHER)
Admission: RE | Admit: 2017-12-08 | Discharge: 2017-12-08 | Disposition: A | Discharge status: Home / Self Care | Payer: BLUE CROSS/BLUE SHIELD | Source: Ambulatory Visit | Attending: Family Medicine | Admitting: Family Medicine

## 2017-12-08 ENCOUNTER — Encounter: Payer: Self-pay | Admitting: Family Medicine

## 2017-12-08 VITALS — BP 111/62 | HR 68 | Ht 61.0 in | Wt 127.0 lb

## 2017-12-08 DIAGNOSIS — G8929 Other chronic pain: Secondary | ICD-10-CM

## 2017-12-08 DIAGNOSIS — M545 Low back pain, unspecified: Secondary | ICD-10-CM

## 2017-12-08 NOTE — Patient Instructions (Addendum)
Get x-rays downstairs next week at your convenience. Try 1/2 tablet of your meloxicam daily with food and [place] if you tolerate this. You can start the home exercises now. Depending on the x-ray results I'll likely recommend physical therapy for you but we'll wait on the results. Tylenol 518m 1-2 tabs three times a day as needed. Consider salon pas patches, topical capsaicin also. Typically follow up with me in 5-6 weeks for this.

## 2017-12-11 ENCOUNTER — Encounter: Payer: Self-pay | Admitting: Family Medicine
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
## 2018-01-19 ENCOUNTER — Ambulatory Visit: Payer: BLUE CROSS/BLUE SHIELD | Admitting: Family Medicine

## 2018-01-19 ENCOUNTER — Encounter: Payer: BLUE CROSS/BLUE SHIELD | Admitting: Family Medicine

## 2018-02-02 ENCOUNTER — Ambulatory Visit: Payer: BLUE CROSS/BLUE SHIELD | Admitting: Family Medicine

## 2018-02-02 ENCOUNTER — Encounter: Payer: Self-pay | Admitting: Family Medicine

## 2018-02-02 VITALS — BP 111/73 | HR 75 | Ht 61.0 in | Wt 127.0 lb

## 2018-02-02 DIAGNOSIS — M545 Low back pain, unspecified: Secondary | ICD-10-CM

## 2018-02-02 DIAGNOSIS — G8929 Other chronic pain: Secondary | ICD-10-CM | POA: Diagnosis not present

## 2018-02-02 NOTE — Patient Instructions (Signed)
Meloxicam, tylenol only if needed. Try ice buddy or something similar - pack you can put in microwave you can get at [facility] or [facility]. Let me know if you want to do physical therapy or MRI of the lumbar spine and we can put order in for this. Continue home exercises and stretches. Consider salon pas patches, topical capsaicin also. Follow up with me in 6 weeks or as needed.

## 2018-02-07 ENCOUNTER — Encounter: Payer: Self-pay | Admitting: Family Medicine
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
## 2018-02-15 DIAGNOSIS — R002 Palpitations: Secondary | ICD-10-CM | POA: Diagnosis not present

## 2018-02-15 DIAGNOSIS — Z9289 Personal history of other medical treatment: Secondary | ICD-10-CM | POA: Diagnosis not present

## 2018-02-15 DIAGNOSIS — Z9889 Other specified postprocedural states: Secondary | ICD-10-CM | POA: Diagnosis not present

## 2018-02-22 ENCOUNTER — Telehealth: Payer: Self-pay

## 2018-02-22 NOTE — Telephone Encounter (Signed)
Copied from [place] [id number]. Topic: General - Inquiry >> Feb 20, 2018  9:14 AM Virl Axe D wrote: Reason for CRM: Pt stated she has just started her cycle today. She has a physical with pelvic exam scheduled with Dr. Charlett Blake on 02/23/18. She would like to know if Dr. Charlett Blake will still do the pelvic exam or if it needs to be scheduled for a later date. Please reach out to pt. [phone]

## 2018-02-23 ENCOUNTER — Ambulatory Visit (INDEPENDENT_AMBULATORY_CARE_PROVIDER_SITE_OTHER): Payer: BLUE CROSS/BLUE SHIELD | Admitting: Family Medicine

## 2018-02-23 ENCOUNTER — Encounter: Payer: Self-pay | Admitting: Family Medicine

## 2018-02-23 ENCOUNTER — Telehealth: Payer: Self-pay

## 2018-02-23 VITALS — BP 98/62 | HR 62 | Temp 98.1°F | Resp 18 | Ht 61.0 in | Wt 135.8 lb

## 2018-02-23 DIAGNOSIS — Z1239 Encounter for other screening for malignant neoplasm of breast: Secondary | ICD-10-CM | POA: Diagnosis not present

## 2018-02-23 DIAGNOSIS — Z0000 Encounter for general adult medical examination without abnormal findings: Secondary | ICD-10-CM | POA: Diagnosis not present

## 2018-02-23 DIAGNOSIS — T7840XD Allergy, unspecified, subsequent encounter: Secondary | ICD-10-CM | POA: Diagnosis not present

## 2018-02-23 DIAGNOSIS — Z803 Family history of malignant neoplasm of breast: Secondary | ICD-10-CM | POA: Diagnosis not present

## 2018-02-23 LAB — LIPID PANEL
Cholesterol: 213 mg/dL — ABNORMAL HIGH (ref 0–200)
HDL: 63.8 mg/dL (ref >39.00)
LDL Cholesterol: 127 mg/dL — ABNORMAL HIGH (ref 0–99)
NonHDL: 149.12
Total CHOL/HDL Ratio: 3
Triglycerides: 109 mg/dL (ref 0.0–149.0)
VLDL: 21.8 mg/dL (ref 0.0–40.0)

## 2018-02-23 LAB — COMPREHENSIVE METABOLIC PANEL
ALT: 12 U/L (ref 0–35)
AST: 19 U/L (ref 0–37)
Albumin: 4.3 g/dL (ref 3.5–5.2)
Alkaline Phosphatase: 53 U/L (ref 39–117)
BUN: 7 mg/dL (ref 6–23)
CO2: 32 mEq/L (ref 19–32)
Calcium: 9.6 mg/dL (ref 8.4–10.5)
Chloride: 102 mEq/L (ref 96–112)
Creatinine, Ser: 0.58 mg/dL (ref 0.40–1.20)
GFR: 119.9 mL/min (ref >60.00)
Glucose, Bld: 89 mg/dL (ref 70–99)
Potassium: 4.5 mEq/L (ref 3.5–5.1)
Sodium: 139 mEq/L (ref 135–145)
Total Bilirubin: 0.4 mg/dL (ref 0.2–1.2)
Total Protein: 7.4 g/dL (ref 6.0–8.3)

## 2018-02-23 LAB — CBC
HCT: 37.4 % (ref 36.0–46.0)
Hemoglobin: 12.5 g/dL (ref 12.0–15.0)
MCHC: 33.3 g/dL (ref 30.0–36.0)
MCV: 90.6 fl (ref 78.0–100.0)
Platelets: 305 10*3/uL (ref 150.0–400.0)
RBC: 4.13 Mil/uL (ref 3.87–5.11)
RDW: 12.7 % (ref 11.5–15.5)
WBC: 4.8 10*3/uL (ref 4.0–10.5)

## 2018-02-23 LAB — TSH: TSH: 3.41 u[IU]/mL (ref 0.35–4.50)

## 2018-02-23 MED ORDER — XOPENEX HFA 45 MCG/ACT IN AERO: 1.0000 | INHALATION_SPRAY | Freq: Four times a day (QID) | RESPIRATORY_TRACT | 12 refills | Status: DC | PRN

## 2018-02-23 NOTE — Patient Instructions (Signed)
Preventive Care 18-39 Years, Female Preventive care refers to lifestyle choices and visits with your health care provider that can promote health and wellness. What does preventive care include?   A yearly physical exam. This is also called an annual well check.  Dental exams once or twice a year.  Routine eye exams. Ask your health care provider how often you should have your eyes checked.  Personal lifestyle choices, including: ? Daily care of your teeth and gums. ? Regular physical activity. ? Eating a healthy diet. ? Avoiding tobacco and drug use. ? Limiting alcohol use. ? Practicing safe sex. ? Taking vitamin and mineral supplements as recommended by your health care provider. What happens during an annual well check? The services and screenings done by your health care provider during your annual well check will depend on your age, overall health, lifestyle risk factors, and family history of disease. Counseling Your health care provider may ask you questions about your:  Alcohol use.  Tobacco use.  Drug use.  Emotional well-being.  Home and relationship well-being.  Sexual activity.  Eating habits.  Work and work environment.  Method of birth control.  Menstrual cycle.  Pregnancy history. Screening You may have the following tests or measurements:  Height, weight, and BMI.  Diabetes screening. This is done by checking your blood sugar (glucose) after you have not eaten for a while (fasting).  Blood pressure.  Lipid and cholesterol levels. These may be checked every 5 years starting at age 20.  Skin check.  Hepatitis C blood test.  Hepatitis B blood test.  Sexually transmitted disease (STD) testing.  BRCA-related cancer screening. This may be done if you have a family history of breast, ovarian, tubal, or peritoneal cancers.  Pelvic exam and Pap test. This may be done every 3 years starting at age 21. Starting at age 30, this may be done every 5  years if you have a Pap test in combination with an HPV test. Discuss your test results, treatment options, and if necessary, the need for more tests with your health care provider. Vaccines Your health care provider may recommend certain vaccines, such as:  Influenza vaccine. This is recommended every year.  Tetanus, diphtheria, and acellular pertussis (Tdap, Td) vaccine. You may need a Td booster every 10 years.  Varicella vaccine. You may need this if you have not been vaccinated.  HPV vaccine. If you are 26 or younger, you may need three doses over 6 months.  Measles, mumps, and rubella (MMR) vaccine. You may need at least one dose of MMR. You may also need a second dose.  Pneumococcal 13-valent conjugate (PCV13) vaccine. You may need this if you have certain conditions and were not previously vaccinated.  Pneumococcal polysaccharide (PPSV23) vaccine. You may need one or two doses if you smoke cigarettes or if you have certain conditions.  Meningococcal vaccine. One dose is recommended if you are age 19-21 years and a first-year college student living in a residence hall, or if you have one of several medical conditions. You may also need additional booster doses.  Hepatitis A vaccine. You may need this if you have certain conditions or if you travel or work in places where you may be exposed to hepatitis A.  Hepatitis B vaccine. You may need this if you have certain conditions or if you travel or work in places where you may be exposed to hepatitis B.  Haemophilus influenzae type b (Hib) vaccine. You may need this if you   have certain risk factors. Talk to your health care provider about which screenings and vaccines you need and how often you need them. This information is not intended to replace advice given to you by your health care provider. Make sure you discuss any questions you have with your health care provider. Document Released: 03/29/2001 Document Revised: 09/13/2016  Document Reviewed: 12/02/2014 Elsevier Interactive Patient Education  2019 [employer].

## 2018-02-23 NOTE — Telephone Encounter (Signed)
PA initiated via Covermymeds; KEY: [id number]. PA approved.   Effective from 02/23/2018 through 02/21/2021

## 2018-02-25 NOTE — Progress Notes (Signed)
Subjective:    Patient ID: April Jackson, female    DOB: 16-Jun-1975, 43 y.o.   MRN: [id number]  No chief complaint on file.   HPI Patient is in today for annual exam and follow up. She is still having respiratory symptoms. No polyuria. No fevers or chills. No recent fever or malaise. Denies CP/palp/SOB/HA/congestion/fevers/GI or GU c/o. Taking meds as prescribed   Past Medical History:  Diagnosis Date  . Allergy    childhood allergies, hives.   . Anemia 04/15/2016  . Back pain 09/28/2014  . Back pain affecting pregnancy, antepartum 09/28/2014  . Cervical cancer screening 01/27/2015   Menarche at 11 Regular and moderate flow No history of abnormal pap in past No sexual activity G0P0, s/p Nohistory of abnormal MGM No concerns today No gyn surgeries  . FH: breast cancer 09/28/2014  . Hyperlipidemia, mixed 09/28/2014  . Hypertension   . Pruritus 09/28/2014  . Reactive airway disease 04/15/2016  . Seasonal allergies   . SVT (supraventricular tachycardia) ([place])   . Thyroid disease     History reviewed. No pertinent surgical history.  Family History  Problem Relation Age of Onset  . Heart disease Father   . Pulmonary fibrosis Father        pneumonia  . Cancer Sister 82       cervical cancer  . Hypertension Sister   . Hyperlipidemia Sister   . Gout Sister   . Diabetes Mother   . Hypertension Mother   . Hyperlipidemia Mother   . Cancer Mother        cervical cancer  . Diabetes Maternal Grandmother   . Stroke Paternal Grandmother   . Heart disease Paternal Grandfather        MI  . Hypertension Sister   . Cholelithiasis Sister   . Thyroid disease Sister   . Ovarian cysts Sister   . Cancer Sister        breast  . Asthma Sister     Social History   Socioeconomic History  . Marital status: Single    Spouse name: Not on file  . Number of children: 0  . Years of education: Not on file  . Highest education level: Not on file  Occupational History  . Occupation: DENTIST   Employer: SELF EMPLOYED  Social Needs  . Financial resource strain: Not on file  . Food insecurity:    Worry: Not on file    Inability: Not on file  . Transportation needs:    Medical: Not on file    Non-medical: Not on file  Tobacco Use  . Smoking status: Never Smoker  . Smokeless tobacco: Never Used  Substance and Sexual Activity  . Alcohol use: No  . Drug use: No  . 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 Physical activity:    Days per week: Not on file    Minutes per session: Not on file  . Stress: Not on file  Relationships  . Social connections:    Talks on phone: Not on file    Gets together: Not on file    Attends religious service: Not on file    Active member of club or organization: Not on file    Attends meetings of clubs or organizations: Not on file    Relationship status: Not on file  . Intimate partner violence:    Fear of current or ex partner: Not on file  Emotionally abused: Not on file    Physically abused: Not on file    Forced sexual activity: Not on file  Other Topics Concern  . Not on file  Social History Narrative  . Not on file    Outpatient Medications Prior to Visit  Medication Sig Dispense Refill  . aspirin 325 MG tablet Take 325 mg by mouth daily.    Marland Kitchen atenolol (TENORMIN) 50 MG tablet Take 1.5 tablets (75 mg total) by mouth daily. (Patient taking differently: Take 50 mg by mouth daily. ) 135 tablet 1  . budesonide (RHINOCORT AQUA) 32 MCG/ACT nasal spray Place 2 sprays into both nostrils daily. 8.6 g 2  . cyclobenzaprine (FLEXERIL) 10 MG tablet Take 10 mg by mouth 3 (three) times daily as needed for muscle spasms.    Marland Kitchen diltiazem (CARDIZEM) 30 MG tablet Take 1 tablet by mouth as needed every four to six hours for sustained heart rates greater than 100 bpm.    . hydrochlorothiazide (HYDRODIURIL) 25 MG tablet Take 25 mg by mouth daily as needed (fluid/hypertension).    Marland Kitchen  levothyroxine (SYNTHROID) 25 MCG tablet Take 1 tablet (25 mcg total) by mouth daily before breakfast. 90 tablet 0  . meloxicam (MOBIC) 15 MG tablet Take 1 tablet (15 mg total) by mouth daily as needed for pain. 30 tablet 2  . nitroGLYCERIN (NITRODUR - DOSED IN MG/24 HR) 0.2 mg/hr patch Apply 1/4th patch to affected shoulder, change daily 30 patch 1  . omeprazole (PRILOSEC) 20 MG capsule Take 20 mg by mouth daily.    Marland Kitchen triamcinolone cream (KENALOG) 0.1 % Apply topically 2 (two) times daily. Apply topically 30 g 3  . albuterol (PROVENTIL HFA;VENTOLIN HFA) 108 (90 Base) MCG/ACT inhaler Inhale 2 puffs into the lungs every 6 (six) hours as needed for wheezing or shortness of breath. (Patient not taking: Reported on 02/23/2018) 1 Inhaler 2  . cetirizine (ZYRTEC) 10 MG tablet Take 10 mg by mouth daily as needed for allergies.     . Loratadine (CLARITIN) 10 MG CAPS Take 1 capsule by mouth daily as needed (alergies).      No facility-administered medications prior to visit.     Allergies  Allergen Reactions  . Amoxicillin Other ([place] Comments)    Rash  . Nitrous Oxide Other ([place] Comments)    Paradoxical response-got very hyper  . Penicillins Other ([place] Comments)    Rash  . Sodium Chloride Swelling    And increased BP    Review of Systems  Constitutional: Negative for chills, fever and malaise/fatigue.  HENT: Negative for congestion and hearing loss.   Eyes: Negative for discharge.  Respiratory: Positive for cough, sputum production and shortness of breath.   Cardiovascular: Negative for chest pain, palpitations and leg swelling.  Gastrointestinal: Negative for abdominal pain, blood in stool, constipation, diarrhea, heartburn, nausea and vomiting.  Genitourinary: Negative for dysuria, frequency, hematuria and urgency.  Musculoskeletal: Negative for back pain, falls and myalgias.  Skin: Negative for rash.  Neurological: Negative for dizziness, sensory change, loss of consciousness, weakness and  headaches.  Endo/Heme/Allergies: Negative for environmental allergies. Does not bruise/bleed easily.  Psychiatric/Behavioral: Negative for depression and suicidal ideas. The patient is not nervous/anxious and does not have insomnia.        Objective:    Physical Exam Vitals signs and nursing note reviewed.  Constitutional:      General: She is not in acute distress.    Appearance: She is well-developed.  HENT:  Head: Normocephalic and atraumatic.     Nose: Nose normal.  Eyes:     General:        Right eye: No discharge.        Left eye: No discharge.  Neck:     Musculoskeletal: Normal range of motion and neck supple.  Cardiovascular:     Rate and Rhythm: Normal rate and regular rhythm.     Heart sounds: No murmur.  Pulmonary:     Effort: Pulmonary effort is normal.     Breath sounds: Normal breath sounds.  Abdominal:     General: Bowel sounds are normal.     Palpations: Abdomen is soft.     Tenderness: There is no abdominal tenderness.  Skin:    General: Skin is warm and dry.  Neurological:     Mental Status: She is alert and oriented to person, place, and time.     BP 98/62 (BP Location: Left Arm, Patient Position: Sitting, Cuff Size: Normal)   Pulse 62   Temp 98.1 F (36.7 C) (Oral)   Resp 18   Ht 5' 1"  (1.549 m)   Wt 135 lb 12.8 oz (61.6 kg)   SpO2 99%   BMI 25.66 kg/m  Wt Readings from Last 3 Encounters:  02/23/18 135 lb 12.8 oz (61.6 kg)  02/02/18 127 lb (57.6 kg)  12/08/17 127 lb (57.6 kg)     Lab Results  Component Value Date   WBC 4.8 02/23/2018   HGB 12.5 02/23/2018   HCT 37.4 02/23/2018   PLT 305.0 02/23/2018   GLUCOSE 89 02/23/2018   CHOL 213 (H) 02/23/2018   TRIG 109.0 02/23/2018   HDL 63.80 02/23/2018   LDLCALC 127 (H) 02/23/2018   ALT 12 02/23/2018   AST 19 02/23/2018   NA 139 02/23/2018   K 4.5 02/23/2018   CL 102 02/23/2018   CREATININE 0.58 02/23/2018   BUN 7 02/23/2018   CO2 32 02/23/2018   TSH 3.41 02/23/2018    Lab  Results  Component Value Date   TSH 3.41 02/23/2018   Lab Results  Component Value Date   WBC 4.8 02/23/2018   HGB 12.5 02/23/2018   HCT 37.4 02/23/2018   MCV 90.6 02/23/2018   PLT 305.0 02/23/2018   Lab Results  Component Value Date   NA 139 02/23/2018   K 4.5 02/23/2018   CO2 32 02/23/2018   GLUCOSE 89 02/23/2018   BUN 7 02/23/2018   CREATININE 0.58 02/23/2018   BILITOT 0.4 02/23/2018   ALKPHOS 53 02/23/2018   AST 19 02/23/2018   ALT 12 02/23/2018   PROT 7.4 02/23/2018   ALBUMIN 4.3 02/23/2018   CALCIUM 9.6 02/23/2018   ANIONGAP 10 09/08/2015   GFR 119.90 02/23/2018   Lab Results  Component Value Date   CHOL 213 (H) 02/23/2018   Lab Results  Component Value Date   HDL 63.80 02/23/2018   Lab Results  Component Value Date   LDLCALC 127 (H) 02/23/2018   Lab Results  Component Value Date   TRIG 109.0 02/23/2018   Lab Results  Component Value Date   CHOLHDL 3 02/23/2018   No results found for: HGBA1C     Assessment & Plan:   Problem List Items Addressed This Visit    Allergy    Cetirizine, nasal saline and Flonase      FH: breast cancer   Preventative health care   Relevant Orders   CBC (Completed)   Comprehensive metabolic panel (Completed)  Lipid panel (Completed)   TSH (Completed)    Other Visit Diagnoses    Breast cancer screening    -  Primary   Relevant Orders   MM 3D SCREEN BREAST BILATERAL      I have discontinued Alvin Falkner's Loratadine, cetirizine, and albuterol. I am also having her start on XOPENEX HFA. Additionally, I am having her maintain her cyclobenzaprine, atenolol, aspirin, hydrochlorothiazide, omeprazole, diltiazem, triamcinolone cream, meloxicam, nitroGLYCERIN, levothyroxine, and budesonide.  Meds ordered this encounter  Medications  . XOPENEX HFA 45 MCG/ACT inhaler    Sig: Inhale 1-2 puffs into the lungs every 6 (six) hours as needed for wheezing.    Dispense:  1 Inhaler    Refill:  12    Patient failed albuterol  with palpitations     Penni Homans, MD

## 2018-02-25 NOTE — Assessment & Plan Note (Signed)
Cetirizine, nasal saline and Flonase

## 2018-02-26 NOTE — Telephone Encounter (Signed)
Spoke with patient she will wait to do her pap at another visit

## 2018-03-23 ENCOUNTER — Ambulatory Visit (HOSPITAL_BASED_OUTPATIENT_CLINIC_OR_DEPARTMENT_OTHER)
Admission: RE | Admit: 2018-03-23 | Discharge: 2018-03-23 | Disposition: A | Discharge status: Home / Self Care | Payer: BLUE CROSS/BLUE SHIELD | Source: Ambulatory Visit | Attending: Family Medicine | Admitting: Family Medicine

## 2018-03-23 ENCOUNTER — Encounter (HOSPITAL_BASED_OUTPATIENT_CLINIC_OR_DEPARTMENT_OTHER): Payer: Self-pay

## 2018-03-23 DIAGNOSIS — Z1231 Encounter for screening mammogram for malignant neoplasm of breast: Secondary | ICD-10-CM | POA: Diagnosis not present

## 2018-03-23 DIAGNOSIS — Z1239 Encounter for other screening for malignant neoplasm of breast: Secondary | ICD-10-CM

## 2018-04-20 DIAGNOSIS — G4733 Obstructive sleep apnea (adult) (pediatric): Secondary | ICD-10-CM | POA: Diagnosis not present

## 2018-04-20 DIAGNOSIS — R002 Palpitations: Secondary | ICD-10-CM | POA: Diagnosis not present

## 2018-04-20 DIAGNOSIS — Z9889 Other specified postprocedural states: Secondary | ICD-10-CM | POA: Diagnosis not present

## 2018-04-20 DIAGNOSIS — E782 Mixed hyperlipidemia: Secondary | ICD-10-CM | POA: Diagnosis not present

## 2018-05-01 ENCOUNTER — Other Ambulatory Visit (HOSPITAL_COMMUNITY)
Admission: RE | Admit: 2018-05-01 | Discharge: 2018-05-01 | Disposition: A | Discharge status: Home / Self Care | Payer: BLUE CROSS/BLUE SHIELD | Source: Ambulatory Visit | Attending: Family Medicine | Admitting: Family Medicine

## 2018-05-01 ENCOUNTER — Ambulatory Visit: Payer: BLUE CROSS/BLUE SHIELD | Admitting: Family Medicine

## 2018-05-01 ENCOUNTER — Other Ambulatory Visit: Payer: Self-pay | Admitting: Family Medicine

## 2018-05-01 ENCOUNTER — Other Ambulatory Visit: Payer: Self-pay

## 2018-05-01 DIAGNOSIS — Z124 Encounter for screening for malignant neoplasm of cervix: Secondary | ICD-10-CM

## 2018-05-01 NOTE — Patient Instructions (Signed)
Anemia  Anemia is a condition in which you do not have enough red blood cells or hemoglobin. Hemoglobin is a substance in red blood cells that carries oxygen. When you do not have enough red blood cells or hemoglobin (are anemic), your body cannot get enough oxygen and your organs may not work properly. As a result, you may feel very tired or have other problems. What are the causes? Common causes of anemia include:  Excessive bleeding. Anemia can be caused by excessive bleeding inside or outside the body, including bleeding from the intestine or from periods in women.  Poor nutrition.  Long-lasting (chronic) kidney, thyroid, and liver disease.  Bone marrow disorders.  Cancer and treatments for cancer.  HIV (human immunodeficiency virus) and AIDS (acquired immunodeficiency syndrome).  Treatments for HIV and AIDS.  Spleen problems.  Blood disorders.  Infections, medicines, and autoimmune disorders that destroy red blood cells. What are the signs or symptoms? Symptoms of this condition include:  Minor weakness.  Dizziness.  Headache.  Feeling heartbeats that are irregular or faster than normal (palpitations).  Shortness of breath, especially with exercise.  Paleness.  Cold sensitivity.  Indigestion.  Nausea.  Difficulty sleeping.  Difficulty concentrating. Symptoms may occur suddenly or develop slowly. If your anemia is mild, you may not have symptoms. How is this diagnosed? This condition is diagnosed based on:  Blood tests.  Your medical history.  A physical exam.  Bone marrow biopsy. Your health care provider may also check your stool (feces) for blood and may do additional testing to look for the cause of your bleeding. You may also have other tests, including:  Imaging tests, such as a CT scan or MRI.  Endoscopy.  Colonoscopy. How is this treated? Treatment for this condition depends on the cause. If you continue to lose a lot of blood, you may  need to be treated at a hospital. Treatment may include:  Taking supplements of iron, vitamin S31, or folic acid.  Taking a hormone medicine (erythropoietin) that can help to stimulate red blood cell growth.  Having a blood transfusion. This may be needed if you lose a lot of blood.  Making changes to your diet.  Having surgery to remove your spleen. Follow these instructions at home:  Take over-the-counter and prescription medicines only as told by your health care provider.  Take supplements only as told by your health care provider.  Follow any diet instructions that you were given.  Keep all follow-up visits as told by your health care provider. This is important. Contact a health care provider if:  You develop new bleeding anywhere in the body. Get help right away if:  You are very weak.  You are short of breath.  You have pain in your abdomen or chest.  You are dizzy or feel faint.  You have trouble concentrating.  You have bloody or black, tarry stools.  You vomit repeatedly or you vomit up blood. Summary  Anemia is a condition in which you do not have enough red blood cells or enough of a substance in your red blood cells that carries oxygen (hemoglobin).  Symptoms may occur suddenly or develop slowly.  If your anemia is mild, you may not have symptoms.  This condition is diagnosed with blood tests as well as a medical history and physical exam. Other tests may be needed.  Treatment for this condition depends on the cause of the anemia. This information is not intended to replace advice given to you by  your health care provider. Make sure you discuss any questions you have with your health care provider. Document Released: 03/10/2004 Document Revised: 03/04/2016 Document Reviewed: 03/04/2016 Elsevier Interactive Patient Education  2019 [employer].

## 2018-05-02 NOTE — Assessment & Plan Note (Signed)
Pap today, no concerns on exam.

## 2018-05-02 NOTE — Progress Notes (Signed)
Subjective:    Patient ID: April Jackson, female    DOB: 03-30-1975, 43 y.o.   MRN: [id number]  Chief Complaint  Patient presents with  . Gynecologic Exam    HPI Patient is in today for pap smear. No gyn complaints. She is not sexually active and she deneis discharge. Denies CP/palp/SOB/HA/congestion/fevers/GI or GU c/o. Taking meds as prescribed  Past Medical History:  Diagnosis Date  . Allergy    childhood allergies, hives.   . Anemia 04/15/2016  . Back pain 09/28/2014  . Back pain affecting pregnancy, antepartum 09/28/2014  . Cervical cancer screening 01/27/2015   Menarche at 11 Regular and moderate flow No history of abnormal pap in past No sexual activity G0P0, s/p Nohistory of abnormal MGM No concerns today No gyn surgeries  . FH: breast cancer 09/28/2014  . Hyperlipidemia, mixed 09/28/2014  . Hypertension   . Pruritus 09/28/2014  . Reactive airway disease 04/15/2016  . Seasonal allergies   . SVT (supraventricular tachycardia) ([place])   . Thyroid disease     No past surgical history on file.  Family History  Problem Relation Age of Onset  . Heart disease Father   . Pulmonary fibrosis Father        pneumonia  . Cancer Sister 15       cervical cancer  . Hypertension Sister   . Hyperlipidemia Sister   . Gout Sister   . Diabetes Mother   . Hypertension Mother   . Hyperlipidemia Mother   . Cancer Mother        cervical cancer  . Diabetes Maternal Grandmother   . Stroke Paternal Grandmother   . Heart disease Paternal Grandfather        MI  . Hypertension Sister   . Cholelithiasis Sister   . Thyroid disease Sister   . Ovarian cysts Sister   . Cancer Sister        breast  . Asthma Sister     Social History   Socioeconomic History  . Marital status: Single    Spouse name: Not on file  . Number of children: 0  . Years of education: Not on file  . Highest education level: Not on file  Occupational History  . Occupation: DENTIST    Employer: SELF EMPLOYED   Social Needs  . Financial resource strain: Not on file  . Food insecurity:    Worry: Not on file    Inability: Not on file  . Transportation needs:    Medical: Not on file    Non-medical: Not on file  Tobacco Use  . Smoking status: Never Smoker  . Smokeless tobacco: Never Used  Substance and Sexual Activity  . Alcohol use: No  . Drug use: No  . 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 Physical activity:    Days per week: Not on file    Minutes per session: Not on file  . Stress: Not on file  Relationships  . Social connections:    Talks on phone: Not on file    Gets together: Not on file    Attends religious service: Not on file    Active member of club or organization: Not on file    Attends meetings of clubs or organizations: Not on file    Relationship status: Not on file  . Intimate partner violence:    Fear of current or ex partner: Not on file  Emotionally abused: Not on file    Physically abused: Not on file    Forced sexual activity: Not on file  Other Topics Concern  . Not on file  Social History Narrative  . Not on file    Outpatient Medications Prior to Visit  Medication Sig Dispense Refill  . aspirin 325 MG tablet Take 325 mg by mouth daily.    Marland Kitchen atenolol (TENORMIN) 50 MG tablet Take 1.5 tablets (75 mg total) by mouth daily. (Patient taking differently: Take 25 mg by mouth daily. ) 135 tablet 1  . budesonide (RHINOCORT AQUA) 32 MCG/ACT nasal spray Place 2 sprays into both nostrils daily. 8.6 g 2  . cyclobenzaprine (FLEXERIL) 10 MG tablet Take 10 mg by mouth 3 (three) times daily as needed for muscle spasms.    Marland Kitchen diltiazem (CARDIZEM) 30 MG tablet Take 1 tablet by mouth as needed every four to six hours for sustained heart rates greater than 100 bpm.    . hydrochlorothiazide (HYDRODIURIL) 25 MG tablet Take 25 mg by mouth daily as needed (fluid/hypertension).    Marland Kitchen levothyroxine (SYNTHROID) 25  MCG tablet Take 1 tablet (25 mcg total) by mouth daily before breakfast. 90 tablet 0  . meloxicam (MOBIC) 15 MG tablet Take 1 tablet (15 mg total) by mouth daily as needed for pain. 30 tablet 2  . nitroGLYCERIN (NITRODUR - DOSED IN MG/24 HR) 0.2 mg/hr patch Apply 1/4th patch to affected shoulder, change daily 30 patch 1  . omeprazole (PRILOSEC) 20 MG capsule Take 20 mg by mouth daily.    Marland Kitchen triamcinolone cream (KENALOG) 0.1 % Apply topically 2 (two) times daily. Apply topically 30 g 3  . XOPENEX HFA 45 MCG/ACT inhaler Inhale 1-2 puffs into the lungs every 6 (six) hours as needed for wheezing. 1 Inhaler 12   No facility-administered medications prior to visit.     Allergies  Allergen Reactions  . Amoxicillin Other ([place] Comments)    Rash  . Nitrous Oxide Other ([place] Comments)    Paradoxical response-got very hyper  . Penicillins Other ([place] Comments)    Rash  . Sodium Chloride Swelling    And increased BP    Review of Systems  Constitutional: Negative for fever and malaise/fatigue.  HENT: Negative for congestion.   Eyes: Negative for blurred vision.  Respiratory: Negative for shortness of breath.   Cardiovascular: Negative for chest pain, palpitations and leg swelling.  Gastrointestinal: Negative for abdominal pain, blood in stool and nausea.  Genitourinary: Negative for dysuria and frequency.  Musculoskeletal: Negative for falls.  Skin: Negative for rash.  Neurological: Negative for dizziness, loss of consciousness and headaches.  Endo/Heme/Allergies: Negative for environmental allergies.  Psychiatric/Behavioral: Negative for depression. The patient is not nervous/anxious.        Objective:    Physical Exam Vitals signs and nursing note reviewed.  Constitutional:      General: She is not in acute distress.    Appearance: She is well-developed.  HENT:     Head: Normocephalic and atraumatic.     Nose: Nose normal.  Eyes:     General:        Right eye: No discharge.         Left eye: No discharge.  Neck:     Musculoskeletal: Normal range of motion and neck supple.  Cardiovascular:     Rate and Rhythm: Normal rate and regular rhythm.     Heart sounds: No murmur.  Pulmonary:     Effort: Pulmonary effort  is normal.     Breath sounds: Normal breath sounds.  Abdominal:     General: Bowel sounds are normal.     Palpations: Abdomen is soft.     Tenderness: There is no abdominal tenderness.  Genitourinary:    General: Normal vulva.     Vagina: No vaginal discharge.     Rectum: Normal.     Comments: Cervix without lesion, no discharge  Skin:    General: Skin is warm and dry.  Neurological:     Mental Status: She is alert and oriented to person, place, and time.     BP 124/78 (BP Location: Left Arm, Patient Position: Sitting, Cuff Size: Normal)   Pulse 75   Temp 98.6 F (37 C) (Oral)   Resp 16   Ht 5' 1"  (1.549 m)   Wt 138 lb (62.6 kg)   SpO2 100%   BMI 26.07 kg/m  Wt Readings from Last 3 Encounters:  05/01/18 138 lb (62.6 kg)  02/23/18 135 lb 12.8 oz (61.6 kg)  02/02/18 127 lb (57.6 kg)     Lab Results  Component Value Date   WBC 4.8 02/23/2018   HGB 12.5 02/23/2018   HCT 37.4 02/23/2018   PLT 305.0 02/23/2018   GLUCOSE 89 02/23/2018   CHOL 213 (H) 02/23/2018   TRIG 109.0 02/23/2018   HDL 63.80 02/23/2018   LDLCALC 127 (H) 02/23/2018   ALT 12 02/23/2018   AST 19 02/23/2018   NA 139 02/23/2018   K 4.5 02/23/2018   CL 102 02/23/2018   CREATININE 0.58 02/23/2018   BUN 7 02/23/2018   CO2 32 02/23/2018   TSH 3.41 02/23/2018    Lab Results  Component Value Date   TSH 3.41 02/23/2018   Lab Results  Component Value Date   WBC 4.8 02/23/2018   HGB 12.5 02/23/2018   HCT 37.4 02/23/2018   MCV 90.6 02/23/2018   PLT 305.0 02/23/2018   Lab Results  Component Value Date   NA 139 02/23/2018   K 4.5 02/23/2018   CO2 32 02/23/2018   GLUCOSE 89 02/23/2018   BUN 7 02/23/2018   CREATININE 0.58 02/23/2018   BILITOT 0.4 02/23/2018    ALKPHOS 53 02/23/2018   AST 19 02/23/2018   ALT 12 02/23/2018   PROT 7.4 02/23/2018   ALBUMIN 4.3 02/23/2018   CALCIUM 9.6 02/23/2018   ANIONGAP 10 09/08/2015   GFR 119.90 02/23/2018   Lab Results  Component Value Date   CHOL 213 (H) 02/23/2018   Lab Results  Component Value Date   HDL 63.80 02/23/2018   Lab Results  Component Value Date   LDLCALC 127 (H) 02/23/2018   Lab Results  Component Value Date   TRIG 109.0 02/23/2018   Lab Results  Component Value Date   CHOLHDL 3 02/23/2018   No results found for: HGBA1C     Assessment & Plan:   Problem List Items Addressed This Visit    Cervical cancer screening    Pap today, no concerns on exam.       Relevant Orders   Cytology - PAP( [facility])      I am having Tamisha H. Summer maintain her cyclobenzaprine, atenolol, aspirin, hydrochlorothiazide, omeprazole, diltiazem, triamcinolone cream, meloxicam, nitroGLYCERIN, levothyroxine, budesonide, and Xopenex HFA.  No orders of the defined types were placed in this encounter.    Penni Homans, MD

## 2018-05-03 LAB — CYTOLOGY - PAP: Diagnosis: NEGATIVE

## 2018-05-04 ENCOUNTER — Other Ambulatory Visit: Payer: Self-pay

## 2018-05-04 ENCOUNTER — Ambulatory Visit: Payer: BLUE CROSS/BLUE SHIELD | Admitting: Family Medicine

## 2018-05-04 ENCOUNTER — Encounter: Payer: Self-pay | Admitting: Family Medicine

## 2018-05-04 VITALS — BP 116/74 | HR 71 | Temp 98.4°F | Ht 61.0 in | Wt 136.0 lb

## 2018-05-04 DIAGNOSIS — G8929 Other chronic pain: Secondary | ICD-10-CM

## 2018-05-04 DIAGNOSIS — M545 Low back pain, unspecified: Secondary | ICD-10-CM

## 2018-05-04 MED ORDER — DICLOFENAC SODIUM 1 % TD GEL: 4.0000 g | Freq: Four times a day (QID) | TRANSDERMAL | 1 refills | Status: DC

## 2018-05-04 MED ORDER — METHOCARBAMOL 500 MG PO TABS: 500.0000 mg | ORAL_TABLET | Freq: Three times a day (TID) | ORAL | 1 refills | Status: DC | PRN

## 2018-05-04 NOTE — Patient Instructions (Signed)
Try voltaren gel up to 4 times a day. Robaxin as needed for pain, muscle spasms - ok to take during the day if it doesn't make you sleepy. Do home exercises daily but avoid ones that extend your back. Consider physical therapy, MRI as next steps but we are in a holding pattern on these because of the virus. Let me know how you're doing in a month to 6 weeks via mychart or phone call.

## 2018-05-07 ENCOUNTER — Encounter: Payer: Self-pay | Admitting: Family Medicine
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
## 2018-07-31 ENCOUNTER — Encounter: Payer: Self-pay | Admitting: Family Medicine

## 2018-08-13 ENCOUNTER — Ambulatory Visit: Payer: BLUE CROSS/BLUE SHIELD | Admitting: Family Medicine

## 2018-08-21 ENCOUNTER — Ambulatory Visit (INDEPENDENT_AMBULATORY_CARE_PROVIDER_SITE_OTHER): Payer: BC Managed Care – PPO | Admitting: Family Medicine

## 2018-08-21 ENCOUNTER — Other Ambulatory Visit: Payer: Self-pay

## 2018-08-21 DIAGNOSIS — G44209 Tension-type headache, unspecified, not intractable: Secondary | ICD-10-CM

## 2018-08-21 DIAGNOSIS — M5489 Other dorsalgia: Secondary | ICD-10-CM | POA: Diagnosis not present

## 2018-08-21 MED ORDER — BUTALBITAL-ACETAMINOPHEN 50-300 MG PO TABS: 1.0000 | ORAL_TABLET | Freq: Two times a day (BID) | ORAL | 0 refills | Status: DC | PRN

## 2018-08-21 MED ORDER — TIZANIDINE HCL 2 MG PO TABS: 1.0000 mg | ORAL_TABLET | Freq: Two times a day (BID) | ORAL | 0 refills | Status: DC | PRN

## 2018-08-21 NOTE — Assessment & Plan Note (Signed)
Encouraged increased hydration, 64 ounces of clear fluids daily. Minimize alcohol and caffeine. Eat small frequent meals with lean proteins and complex carbs. Avoid high and low blood sugars. Get adequate sleep, 7-8 hours a night. Needs exercise daily preferably in the morning. 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 Tylenol is somewhat helpful. Is given a prescription for Tizanidine to use 1-4 mg bid prn and Bupap 50/300 to try as well. Notify us if worsens or changes or if meds are not helpful.

## 2018-08-21 NOTE — Assessment & Plan Note (Signed)
Encouraged moist heat and gentle stretching as tolerated. May try NSAIDs and prescription meds as directed and report if symptoms worsen or seek immediate care 

## 2018-08-21 NOTE — Progress Notes (Signed)
Patient ID: April Jackson, female   DOB: 25-Nov-1975, 43 y.o.   MRN: [id number] Virtual Visit via Video Note  I connected with April Jackson on 08/21/18 at  3:40 PM EDT by a video enabled telemedicine application and verified that I am speaking with the correct person using two identifiers.  Location: Patient: home Provider: office   I discussed the limitations of evaluation and management by telemedicine and the availability of in person appointments. The patient expressed understanding and agreed to proceed. Princess Eulas Post CMA was able to get the patient set up on video visit.     Subjective:    Patient ID: April Jackson, female    DOB: November 20, 1975, 43 y.o.   MRN: [id number]  No chief complaint on file.   HPI Patient is in today for evaluation of headaches and back pain. 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 Tylenol is somewhat helpful. She denies any recent febrile illness or hospitalizations. No other neurologic complaints. Denies CP/palp/SOB/congestion/fevers/GI or GU c/o. Taking meds as prescribed  Past Medical History:  Diagnosis Date  . Allergy    childhood allergies, hives.   . Anemia 04/15/2016  . Back pain 09/28/2014  . Back pain affecting pregnancy, antepartum 09/28/2014  . Cervical cancer screening 01/27/2015   Menarche at 11 Regular and moderate flow No history of abnormal pap in past No sexual activity G0P0, s/p Nohistory of abnormal MGM No concerns today No gyn surgeries  . FH: breast cancer 09/28/2014  . Hyperlipidemia, mixed 09/28/2014  . Hypertension   . Pruritus 09/28/2014  . Reactive airway disease 04/15/2016  . Seasonal allergies   . SVT (supraventricular tachycardia) ([place])   . Thyroid disease     No past surgical history on file.  Family History  Problem Relation Age of Onset  . Heart disease Father   . Pulmonary fibrosis Father    pneumonia  . Cancer Sister 66       cervical cancer  . Hypertension Sister   . Hyperlipidemia Sister   . Gout Sister   . Diabetes Mother   . Hypertension Mother   . Hyperlipidemia Mother   . Cancer Mother        cervical cancer  . Diabetes Maternal Grandmother   . Stroke Paternal Grandmother   . Heart disease Paternal Grandfather        MI  . Hypertension Sister   . Cholelithiasis Sister   . Thyroid disease Sister   . Ovarian cysts Sister   . Cancer Sister        breast  . Asthma Sister     Social History   Socioeconomic History  . Marital status: Single    Spouse name: Not on file  . Number of children: 0  . Years of education: Not on file  . Highest education level: Not on file  Occupational History  . Occupation: DENTIST    Employer: SELF EMPLOYED  Social Needs  . Financial resource strain: Not on file  . Food insecurity    Worry: Not on file    Inability: Not on file  . Transportation needs    Medical: Not on file    Non-medical: Not on file  Tobacco Use  . Smoking status: Never Smoker  . Smokeless tobacco: Never Used  Substance and Sexual Activity  . Alcohol use: No  .  Drug use: No  . 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 Physical activity    Days per week: Not on file    Minutes per session: Not on file  . Stress: Not on file  Relationships  . Social Herbalist on phone: Not on file    Gets together: Not on file    Attends religious service: Not on file    Active member of club or organization: Not on file    Attends meetings of clubs or organizations: Not on file    Relationship status: Not on file  . Intimate partner violence    Fear of current or ex partner: Not on file    Emotionally abused: Not on file    Physically abused: Not on file    Forced sexual activity: Not on file  Other Topics Concern  . Not on file  Social History Narrative  . Not on file     Outpatient Medications Prior to Visit  Medication Sig Dispense Refill  . aspirin 325 MG tablet Take 325 mg by mouth daily.    Marland Kitchen atenolol (TENORMIN) 50 MG tablet Take 1.5 tablets (75 mg total) by mouth daily. (Patient taking differently: Take 25 mg by mouth daily. ) 135 tablet 1  . budesonide (RHINOCORT AQUA) 32 MCG/ACT nasal spray Place 2 sprays into both nostrils daily. 8.6 g 2  . cyclobenzaprine (FLEXERIL) 10 MG tablet Take 10 mg by mouth 3 (three) times daily as needed for muscle spasms.    . diclofenac sodium (VOLTAREN) 1 % GEL Apply 4 g topically 4 (four) times daily. 3 Tube 1  . diltiazem (CARDIZEM) 30 MG tablet Take 1 tablet by mouth as needed every four to six hours for sustained heart rates greater than 100 bpm.    . hydrochlorothiazide (HYDRODIURIL) 25 MG tablet Take 25 mg by mouth daily as needed (fluid/hypertension).    Marland Kitchen levothyroxine (SYNTHROID) 25 MCG tablet Take 1 tablet (25 mcg total) by mouth daily before breakfast. 90 tablet 0  . meloxicam (MOBIC) 15 MG tablet Take 1 tablet (15 mg total) by mouth daily as needed for pain. 30 tablet 2  . methocarbamol (ROBAXIN) 500 MG tablet Take 1 tablet (500 mg total) by mouth every 8 (eight) hours as needed. 60 tablet 1  . nitroGLYCERIN (NITRODUR - DOSED IN MG/24 HR) 0.2 mg/hr patch Apply 1/4th patch to affected shoulder, change daily 30 patch 1  . omeprazole (PRILOSEC) 20 MG capsule Take 20 mg by mouth daily.    Marland Kitchen triamcinolone cream (KENALOG) 0.1 % Apply topically 2 (two) times daily. Apply topically 30 g 3  . XOPENEX HFA 45 MCG/ACT inhaler Inhale 1-2 puffs into the lungs every 6 (six) hours as needed for wheezing. 1 Inhaler 12   No facility-administered medications prior to visit.     Allergies  Allergen Reactions  . Amoxicillin Other ([place] Comments)    Rash  . Nitrous Oxide Other ([place] Comments)    Paradoxical response-got very hyper  . Penicillins Other ([place] Comments)    Rash  . Sodium Chloride Swelling    And increased BP     Review of Systems  Constitutional: Negative for fever and malaise/fatigue.  HENT: Negative for congestion.   Eyes: Negative for blurred vision.  Respiratory: Negative for shortness of breath.   Cardiovascular: Negative for chest pain, palpitations and leg swelling.  Gastrointestinal: Negative for abdominal pain, blood  in stool and nausea.  Genitourinary: Negative for dysuria and frequency.  Musculoskeletal: Positive for back pain and neck pain. Negative for falls.  Skin: Negative for rash.  Neurological: Positive for headaches. Negative for dizziness and loss of consciousness.  Endo/Heme/Allergies: Negative for environmental allergies.  Psychiatric/Behavioral: Negative for depression. The patient is not nervous/anxious.        Objective:    Physical Exam Constitutional:      Appearance: Normal appearance. She is not ill-appearing.  HENT:     Head: Normocephalic and atraumatic.     Right Ear: There is no impacted cerumen.     Left Ear: There is no impacted cerumen.     Nose: Nose normal.  Pulmonary:     Effort: Pulmonary effort is normal.  Neurological:     Mental Status: She is alert and oriented to person, place, and time.  Psychiatric:        Mood and Affect: Mood normal.        Behavior: Behavior normal.     There were no vitals taken for this visit. Wt Readings from Last 3 Encounters:  05/04/18 136 lb (61.7 kg)  05/01/18 138 lb (62.6 kg)  02/23/18 135 lb 12.8 oz (61.6 kg)    Diabetic Foot Exam - Simple   No data filed     Lab Results  Component Value Date   WBC 4.8 02/23/2018   HGB 12.5 02/23/2018   HCT 37.4 02/23/2018   PLT 305.0 02/23/2018   GLUCOSE 89 02/23/2018   CHOL 213 (H) 02/23/2018   TRIG 109.0 02/23/2018   HDL 63.80 02/23/2018   LDLCALC 127 (H) 02/23/2018   ALT 12 02/23/2018   AST 19 02/23/2018   NA 139 02/23/2018   K 4.5 02/23/2018   CL 102 02/23/2018   CREATININE 0.58 02/23/2018   BUN 7 02/23/2018   CO2 32 02/23/2018   TSH 3.41  02/23/2018    Lab Results  Component Value Date   TSH 3.41 02/23/2018   Lab Results  Component Value Date   WBC 4.8 02/23/2018   HGB 12.5 02/23/2018   HCT 37.4 02/23/2018   MCV 90.6 02/23/2018   PLT 305.0 02/23/2018   Lab Results  Component Value Date   NA 139 02/23/2018   K 4.5 02/23/2018   CO2 32 02/23/2018   GLUCOSE 89 02/23/2018   BUN 7 02/23/2018   CREATININE 0.58 02/23/2018   BILITOT 0.4 02/23/2018   ALKPHOS 53 02/23/2018   AST 19 02/23/2018   ALT 12 02/23/2018   PROT 7.4 02/23/2018   ALBUMIN 4.3 02/23/2018   CALCIUM 9.6 02/23/2018   ANIONGAP 10 09/08/2015   GFR 119.90 02/23/2018   Lab Results  Component Value Date   CHOL 213 (H) 02/23/2018   Lab Results  Component Value Date   HDL 63.80 02/23/2018   Lab Results  Component Value Date   LDLCALC 127 (H) 02/23/2018   Lab Results  Component Value Date   TRIG 109.0 02/23/2018   Lab Results  Component Value Date   CHOLHDL 3 02/23/2018   No results found for: HGBA1C     Assessment & Plan:   Problem List Items Addressed This Visit    Back pain    Encouraged moist heat and gentle stretching as tolerated. May try NSAIDs and prescription meds as directed and report if symptoms worsen or seek immediate care      Relevant Medications   tiZANidine (ZANAFLEX) 2 MG tablet   Butalbital-Acetaminophen 50-300 MG TABS  Tension headache    Encouraged increased hydration, 64 ounces of clear fluids daily. Minimize alcohol and caffeine. Eat small frequent meals with lean proteins and complex carbs. Avoid high and low blood sugars. Get adequate sleep, 7-8 hours a night. Needs exercise daily preferably in the morning. 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 Tylenol is somewhat helpful. Is given a prescription for Tizanidine to use 1-4 mg bid prn and Bupap 50/300 to try as well. Notify us if  worsens or changes or if meds are not helpful.      Relevant Medications   tiZANidine (ZANAFLEX) 2 MG tablet   Butalbital-Acetaminophen 50-300 MG TABS      I am having Cadi H. Breault start on tiZANidine and Butalbital-Acetaminophen. I am also having her maintain her cyclobenzaprine, atenolol, aspirin, hydrochlorothiazide, omeprazole, diltiazem, triamcinolone cream, meloxicam, nitroGLYCERIN, levothyroxine, budesonide, Xopenex HFA, diclofenac sodium, and methocarbamol.  Meds ordered this encounter  Medications  . tiZANidine (ZANAFLEX) 2 MG tablet    Sig: Take 0.5-2 tablets (1-4 mg total) by mouth 2 (two) times daily as needed for muscle spasms (headaches).    Dispense:  30 tablet    Refill:  0  . Butalbital-Acetaminophen 50-300 MG TABS    Sig: Take 1 tablet by mouth 2 (two) times daily as needed (headache).    Dispense:  30 tablet    Refill:  0     I discussed the assessment and treatment plan with the patient. The patient was provided an opportunity to ask questions and all were answered. The patient agreed with the plan and demonstrated an understanding of the instructions.   The patient was advised to call back or seek an in-person evaluation if the symptoms worsen or if the condition fails to improve as anticipated.  I provided 25 minutes of non-face-to-face time during this encounter.   Penni Homans, MD

## 2018-08-23 ENCOUNTER — Telehealth: Payer: Self-pay | Admitting: Family Medicine

## 2018-08-23 ENCOUNTER — Other Ambulatory Visit: Payer: Self-pay | Admitting: Family Medicine

## 2018-08-23 MED ORDER — BUTALBITAL-ACETAMINOPHEN 50-325 MG PO TABS: 1.0000 | ORAL_TABLET | Freq: Three times a day (TID) | ORAL | 0 refills | Status: DC | PRN

## 2018-08-23 NOTE — Telephone Encounter (Signed)
Butalbital-Acetaminophen   [facility] [place], [place] [phone] (Phone) [phone] (Fax)   Pt was scripted fr 50/300 on 7/7 but because it will be over $100.00 RX wants to snd in request for 50/325 instead. Pt is in a lot of pain/ requesting asap

## 2018-08-23 NOTE — Telephone Encounter (Signed)
I sent some in

## 2018-08-23 NOTE — Telephone Encounter (Signed)
Please advise 

## 2018-08-24 ENCOUNTER — Telehealth: Payer: Self-pay | Admitting: Family Medicine

## 2018-08-24 NOTE — Telephone Encounter (Signed)
Needs virtual visit.

## 2018-08-24 NOTE — Telephone Encounter (Signed)
Pt called and stated that she was possibly exposed to ecoid and would like to have testing done. 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

## 2018-08-27 ENCOUNTER — Ambulatory Visit (INDEPENDENT_AMBULATORY_CARE_PROVIDER_SITE_OTHER): Payer: BC Managed Care – PPO | Admitting: Medical

## 2018-08-27 ENCOUNTER — Other Ambulatory Visit: Payer: Self-pay

## 2018-08-27 ENCOUNTER — Telehealth: Payer: Self-pay | Admitting: Medical

## 2018-08-27 VITALS — BP 117/71 | HR 73 | Temp 97.2°F | Ht 61.0 in | Wt 129.0 lb

## 2018-08-27 DIAGNOSIS — Z20822 Contact with and (suspected) exposure to covid-19: Secondary | ICD-10-CM

## 2018-08-27 DIAGNOSIS — Z20828 Contact with and (suspected) exposure to other viral communicable diseases: Secondary | ICD-10-CM | POA: Diagnosis not present
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
## 2018-08-27 NOTE — Progress Notes (Signed)
Subjective:    Patient ID: April Jackson, female    DOB: December 09, 1975, 43 y.o.   MRN: [id number]  HPI   Virtual Visit via Video Note  I connected with April Jackson on 08/27/18 at  3:20 PM EDT by a video enabled telemedicine application and verified that I am speaking with the correct person using two identifiers.  Location: Patient: work  Provider: office   I discussed the limitations of evaluation and management by telemedicine and the availability of in person appointments. The patient expressed understanding and agreed to proceed.     I discussed the assessment and treatment plan with the patient. The patient was provided an opportunity to ask questions and all were answered. The patient agreed with the plan and demonstrated an understanding of the instructions.   The patient was advised to call back or seek an in-person evaluation if the symptoms worsen or if the condition fails to improve as anticipated.  I provided 15 minutes of non-face-to-face time during this encounter.   Mackie Pai, PA-C   Hpi.   Pt states her sister recently told she had potential covid exposure. Pt sister works at [employer] as phlebotomy.Pt works in Soil [employer]. She was told get tested as precaution. Pt has no symptoms. [employer] ros. Visited with her sister last week.   Pt sister has test on Friday. Results pending. Everyone in house asymptomatic.   Review of Systems  Constitutional: Negative for chills, fatigue and fever.  HENT: Negative for congestion.        No change in smell.  Respiratory: Negative for cough, chest tightness, shortness of breath and wheezing.   Cardiovascular: Negative for chest pain and palpitations.  Gastrointestinal: Negative for nausea and vomiting.  Genitourinary: Negative for dysuria and flank pain.  Musculoskeletal: Negative for back pain and myalgias.  Skin: Negative for rash.  Neurological: Negative for dizziness, light-headedness and headaches.  Hematological:  Negative for adenopathy. Does not bruise/bleed easily.  Psychiatric/Behavioral: Negative for behavioral problems.    Past Medical History:  Diagnosis Date  . Allergy    childhood allergies, hives.   . Anemia 04/15/2016  . Back pain 09/28/2014  . Back pain affecting pregnancy, antepartum 09/28/2014  . Cervical cancer screening 01/27/2015   Menarche at 11 Regular and moderate flow No history of abnormal pap in past No sexual activity G0P0, s/p Nohistory of abnormal MGM No concerns today No gyn surgeries  . FH: breast cancer 09/28/2014  . Hyperlipidemia, mixed 09/28/2014  . Hypertension   . Pruritus 09/28/2014  . Reactive airway disease 04/15/2016  . Seasonal allergies   . SVT (supraventricular tachycardia) ([place])   . Thyroid disease      Social History   Socioeconomic History  . Marital status: Single    Spouse name: Not on file  . Number of children: 0  . Years of education: Not on file  . Highest education level: Not on file  Occupational History  . Occupation: DENTIST    Employer: SELF EMPLOYED  Social Needs  . Financial resource strain: Not on file  . Food insecurity    Worry: Not on file    Inability: Not on file  . Transportation needs    Medical: Not on file    Non-medical: Not on file  Tobacco Use  . Smoking status: Never Smoker  . Smokeless tobacco: Never Used  Substance and Sexual Activity  . Alcohol use: No  . Drug use: No  . Sexual activity: Not on file  Comment: Dentist, no dietary restriction, lives with sister  Lifestyle  . Physical activity    Days per week: Not on file    Minutes per session: Not on file  . Stress: Not on file  Relationships  . Social Herbalist on phone: Not on file    Gets together: Not on file    Attends religious service: Not on file    Active member of club or organization: Not on file    Attends meetings of clubs or organizations: Not on file    Relationship status: Not on file  . Intimate partner violence    Fear  of current or ex partner: Not on file    Emotionally abused: Not on file    Physically abused: Not on file    Forced sexual activity: Not on file  Other Topics Concern  . Not on file  Social History Narrative  . Not on file    No past surgical history on file.  Family History  Problem Relation Age of Onset  . Heart disease Father   . Pulmonary fibrosis Father        pneumonia  . Cancer Sister 34       cervical cancer  . Hypertension Sister   . Hyperlipidemia Sister   . Gout Sister   . Diabetes Mother   . Hypertension Mother   . Hyperlipidemia Mother   . Cancer Mother        cervical cancer  . Diabetes Maternal Grandmother   . Stroke Paternal Grandmother   . Heart disease Paternal Grandfather        MI  . Hypertension Sister   . Cholelithiasis Sister   . Thyroid disease Sister   . Ovarian cysts Sister   . Cancer Sister        breast  . Asthma Sister     Allergies  Allergen Reactions  . Amoxicillin Other ([place] Comments)    Rash  . Nitrous Oxide Other ([place] Comments)    Paradoxical response-got very hyper  . Penicillins Other ([place] Comments)    Rash  . Sodium Chloride Swelling    And increased BP    Current Outpatient Medications on File Prior to Visit  Medication Sig Dispense Refill  . ACETAMINOPHEN-BUTALBITAL 50-325 MG TABS Take 1 tablet by mouth 3 (three) times daily as needed. 120 tablet 0  . aspirin 325 MG tablet Take 325 mg by mouth daily.    Marland Kitchen atenolol (TENORMIN) 50 MG tablet Take 1.5 tablets (75 mg total) by mouth daily. (Patient taking differently: Take 25 mg by mouth daily. ) 135 tablet 1  . budesonide (RHINOCORT AQUA) 32 MCG/ACT nasal spray Place 2 sprays into both nostrils daily. 8.6 g 2  . cyclobenzaprine (FLEXERIL) 10 MG tablet Take 10 mg by mouth 3 (three) times daily as needed for muscle spasms.    . diclofenac sodium (VOLTAREN) 1 % GEL Apply 4 g topically 4 (four) times daily. 3 Tube 1  . diltiazem (CARDIZEM) 30 MG tablet Take 1 tablet by mouth  as needed every four to six hours for sustained heart rates greater than 100 bpm.    . hydrochlorothiazide (HYDRODIURIL) 25 MG tablet Take 25 mg by mouth daily as needed (fluid/hypertension).    Marland Kitchen levothyroxine (SYNTHROID) 25 MCG tablet Take 1 tablet (25 mcg total) by mouth daily before breakfast. 90 tablet 0  . meloxicam (MOBIC) 15 MG tablet Take 1 tablet (15 mg total) by mouth daily as needed  for pain. 30 tablet 2  . methocarbamol (ROBAXIN) 500 MG tablet Take 1 tablet (500 mg total) by mouth every 8 (eight) hours as needed. 60 tablet 1  . nitroGLYCERIN (NITRODUR - DOSED IN MG/24 HR) 0.2 mg/hr patch Apply 1/4th patch to affected shoulder, change daily 30 patch 1  . omeprazole (PRILOSEC) 20 MG capsule Take 20 mg by mouth daily.    Marland Kitchen tiZANidine (ZANAFLEX) 2 MG tablet Take 0.5-2 tablets (1-4 mg total) by mouth 2 (two) times daily as needed for muscle spasms (headaches). 30 tablet 0  . triamcinolone cream (KENALOG) 0.1 % Apply topically 2 (two) times daily. Apply topically 30 g 3  . XOPENEX HFA 45 MCG/ACT inhaler Inhale 1-2 puffs into the lungs every 6 (six) hours as needed for wheezing. 1 Inhaler 12   No current facility-administered medications on file prior to visit.     BP 106/70   Pulse 73   Temp (!) 97.2 F (36.2 C)   Ht 5' 1"  (1.549 m)   Wt 129 lb (58.5 kg)   SpO2 98%   BMI 24.37 kg/m       Objective:   Physical Exam  General-no acute distress, pleasant, oriented. Lungs- on inspection lungs appear unlabored. Neck- no tracheal deviation or jvd on inspection. Neuro- gross motor function appears intact.      Assessment & Plan:

## 2018-08-27 NOTE — Telephone Encounter (Signed)
Spoke with pt and she has been scheduled for the [facility] testing site for 08/28/18 at 3:30. Pt is aware to remain in her car and wear a mask. Pt understood and had no additional questions at this time. Nothing further is needed  Order was placed

## 2018-08-27 NOTE — Patient Instructions (Signed)
Pt has some potential secondary exposure to covid positive person. Yet to be determined if her asymptomatic sister who had direct contact  tests positive.  Pt asymptomatic. Will place order for her to get tested. Explained to notify her boss of current situation. 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  However, if she finds out her sister is positive for covid or if she begins to develop any symptoms then notify me immediately as in that event would have her stay at home and follow self quarantine guidelines. Pt expressed understanding.  Follow up as needed.

## 2018-08-28 ENCOUNTER — Other Ambulatory Visit: Payer: BC Managed Care – PPO

## 2018-08-28 DIAGNOSIS — Z20822 Contact with and (suspected) exposure to covid-19: Secondary | ICD-10-CM

## 2018-08-28 DIAGNOSIS — R6889 Other general symptoms and signs: Secondary | ICD-10-CM | POA: Diagnosis not present

## 2018-09-01 LAB — NOVEL CORONAVIRUS, NAA: SARS-CoV-2, NAA: NOT DETECTED

## 2018-09-21 ENCOUNTER — Encounter: Payer: Self-pay | Admitting: Family Medicine

## 2018-09-21 ENCOUNTER — Other Ambulatory Visit: Payer: Self-pay

## 2018-09-21 ENCOUNTER — Ambulatory Visit (INDEPENDENT_AMBULATORY_CARE_PROVIDER_SITE_OTHER): Payer: BC Managed Care – PPO | Admitting: Family Medicine

## 2018-09-21 DIAGNOSIS — F329 Major depressive disorder, single episode, unspecified: Secondary | ICD-10-CM | POA: Diagnosis not present

## 2018-09-21 DIAGNOSIS — B354 Tinea corporis: Secondary | ICD-10-CM | POA: Diagnosis not present

## 2018-09-21 DIAGNOSIS — F32A Depression, unspecified: Secondary | ICD-10-CM

## 2018-09-21 DIAGNOSIS — F419 Anxiety disorder, unspecified: Secondary | ICD-10-CM

## 2018-09-21 HISTORY — DX: Anxiety disorder, unspecified: F41.9

## 2018-09-21 HISTORY — DX: Depression, unspecified: F32.A

## 2018-09-21 MED ORDER — FLUCONAZOLE 150 MG PO TABS: 150.0000 mg | ORAL_TABLET | ORAL | 1 refills | Status: DC

## 2018-09-21 MED ORDER — KETOCONAZOLE 2 % EX SHAM: 1.0000 "application " | MEDICATED_SHAMPOO | CUTANEOUS | 1 refills | Status: DC

## 2018-09-21 MED ORDER — ALPRAZOLAM 0.25 MG PO TABS: 0.2500 mg | ORAL_TABLET | Freq: Two times a day (BID) | ORAL | 2 refills | Status: DC | PRN

## 2018-09-21 NOTE — Assessment & Plan Note (Addendum)
Has been more stressed during the pandemic but has had to use medications in the past. She failed Sertraline and Wellbutrin and did best on Lexapro just 2.5 mg in the past. So she will start Escitalopram 2.5 mg daily, can increase to 5 mg as tolerated. Has been allowed Alprazolam 0.25 mg tabs, 1/2 to 1 tab po bid prn anxiety, reassess in 8-12 weeks. Spent 20 minutes of a 25 minute visit in counseling.

## 2018-09-21 NOTE — Progress Notes (Signed)
Virtual Visit via Video Note  I connected with April Jackson on 09/21/18 at  8:00 AM EDT by a video enabled telemedicine application and verified that I am speaking with the correct person using two identifiers.  Location: Patient: home Provider: home   I discussed the limitations of evaluation and management by telemedicine and the availability of in person appointments. The patient expressed understanding and agreed to proceed. Magdalene Molly, CMA was able to get the patient set up on a visit, video   Subjective:    Patient ID: April Jackson, female    DOB: [birth year], 43 y.o.   MRN: [id number]  No chief complaint on file.   HPI Patient is in today for evaluation of worsening anxiety and depression during this pandemic. She has a long history of anxiety and has taken Zoloft, Lexapro and Wellbutrin in the past although it has been many years. She notes with the increased stress she has anhedonia, anxiety attacks with palpitations, tremulousness, sense of dis ease and more. No recent febrile illness or hospitalizations. She just does not feel settled. Also notes a rash which is mildly pruritic on her scalp and forehead. Denies CP/palp/SOB/HA/congestion/fevers/GI or GU c/o. Taking meds as prescribed  Past Medical History:  Diagnosis Date  . Allergy    childhood allergies, hives.   . Anemia 04/15/2016  . Anxiety and depression 09/21/2018  . Back pain 09/28/2014  . Back pain affecting pregnancy, antepartum 09/28/2014  . Cervical cancer screening 01/27/2015   Menarche at 11 Regular and moderate flow No history of abnormal pap in past No sexual activity G0P0, s/p Nohistory of abnormal MGM No concerns today No gyn surgeries  . FH: breast cancer 09/28/2014  . Hyperlipidemia, mixed 09/28/2014  . Hypertension   . Pruritus 09/28/2014  . Reactive airway disease 04/15/2016  . Seasonal allergies   . SVT (supraventricular tachycardia) ([place])   . Thyroid disease     No past surgical history on file.   Family History  Problem Relation Age of Onset  . Heart disease Father   . Pulmonary fibrosis Father        pneumonia  . Cancer Sister 78       cervical cancer  . Hypertension Sister   . Hyperlipidemia Sister   . Gout Sister   . Diabetes Mother   . Hypertension Mother   . Hyperlipidemia Mother   . Cancer Mother        cervical cancer  . Diabetes Maternal Grandmother   . Stroke Paternal Grandmother   . Heart disease Paternal Grandfather        MI  . Hypertension Sister   . Cholelithiasis Sister   . Thyroid disease Sister   . Ovarian cysts Sister   . Cancer Sister        breast  . Asthma Sister     Social History   Socioeconomic History  . Marital status: Single    Spouse name: Not on file  . Number of children: 0  . Years of education: Not on file  . Highest education level: Not on file  Occupational History  . Occupation: DENTIST    Employer: SELF EMPLOYED  Social Needs  . Financial resource strain: Not on file  . Food insecurity    Worry: Not on file    Inability: Not on file  . Transportation needs    Medical: Not on file    Non-medical: Not on file  Tobacco Use  . Smoking status: Never Smoker  .  Smokeless tobacco: Never Used  Substance and Sexual Activity  . Alcohol use: No  . Drug use: No  . 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 Physical activity    Days per week: Not on file    Minutes per session: Not on file  . Stress: Not on file  Relationships  . Social Herbalist on phone: Not on file    Gets together: Not on file    Attends religious service: Not on file    Active member of club or organization: Not on file    Attends meetings of clubs or organizations: Not on file    Relationship status: Not on file  . Intimate partner violence    Fear of current or ex partner: Not on file    Emotionally abused: Not on file    Physically abused: Not on file    Forced sexual  activity: Not on file  Other Topics Concern  . Not on file  Social History Narrative  . Not on file    Outpatient Medications Prior to Visit  Medication Sig Dispense Refill  . ACETAMINOPHEN-BUTALBITAL 50-325 MG TABS Take 1 tablet by mouth 3 (three) times daily as needed. 120 tablet 0  . aspirin 325 MG tablet Take 325 mg by mouth daily.    Marland Kitchen atenolol (TENORMIN) 50 MG tablet Take 1.5 tablets (75 mg total) by mouth daily. (Patient taking differently: Take 25 mg by mouth daily. ) 135 tablet 1  . budesonide (RHINOCORT AQUA) 32 MCG/ACT nasal spray Place 2 sprays into both nostrils daily. 8.6 g 2  . cyclobenzaprine (FLEXERIL) 10 MG tablet Take 10 mg by mouth 3 (three) times daily as needed for muscle spasms.    . diclofenac sodium (VOLTAREN) 1 % GEL Apply 4 g topically 4 (four) times daily. 3 Tube 1  . diltiazem (CARDIZEM) 30 MG tablet Take 1 tablet by mouth as needed every four to six hours for sustained heart rates greater than 100 bpm.    . hydrochlorothiazide (HYDRODIURIL) 25 MG tablet Take 25 mg by mouth daily as needed (fluid/hypertension).    Marland Kitchen levothyroxine (SYNTHROID) 25 MCG tablet Take 1 tablet (25 mcg total) by mouth daily before breakfast. 90 tablet 0  . meloxicam (MOBIC) 15 MG tablet Take 1 tablet (15 mg total) by mouth daily as needed for pain. 30 tablet 2  . methocarbamol (ROBAXIN) 500 MG tablet Take 1 tablet (500 mg total) by mouth every 8 (eight) hours as needed. 60 tablet 1  . nitroGLYCERIN (NITRODUR - DOSED IN MG/24 HR) 0.2 mg/hr patch Apply 1/4th patch to affected shoulder, change daily 30 patch 1  . omeprazole (PRILOSEC) 20 MG capsule Take 20 mg by mouth daily.    Marland Kitchen tiZANidine (ZANAFLEX) 2 MG tablet Take 0.5-2 tablets (1-4 mg total) by mouth 2 (two) times daily as needed for muscle spasms (headaches). 30 tablet 0  . triamcinolone cream (KENALOG) 0.1 % Apply topically 2 (two) times daily. Apply topically 30 g 3  . XOPENEX HFA 45 MCG/ACT inhaler Inhale 1-2 puffs into the lungs  every 6 (six) hours as needed for wheezing. 1 Inhaler 12   No facility-administered medications prior to visit.     Allergies  Allergen Reactions  . Amoxicillin Other ([place] Comments)    Rash  . Nitrous Oxide Other ([place] Comments)    Paradoxical response-got very hyper  . Penicillins Other ([place] Comments)  Rash  . Sodium Chloride Swelling    And increased BP    Review of Systems  Constitutional: Negative for fever and malaise/fatigue.  HENT: Negative for congestion.   Eyes: Negative for blurred vision.  Respiratory: Negative for shortness of breath.   Cardiovascular: Negative for chest pain, palpitations and leg swelling.  Gastrointestinal: Negative for abdominal pain, blood in stool and nausea.  Genitourinary: Negative for dysuria and frequency.  Musculoskeletal: Negative for falls.  Skin: Positive for itching and rash.  Neurological: Negative for dizziness, loss of consciousness and headaches.  Endo/Heme/Allergies: Negative for environmental allergies.  Psychiatric/Behavioral: Positive for depression. The patient is nervous/anxious.        Objective:    Physical Exam Constitutional:      Appearance: Normal appearance. She is not ill-appearing.  HENT:     Head: Normocephalic and atraumatic.  Eyes:     General:        Right eye: No discharge.        Left eye: No discharge.  Pulmonary:     Effort: Pulmonary effort is normal.  Skin:    Findings: Rash present.     Comments: Scalp and forehead have a scaly, raised scattered light colored rash over area.   Neurological:     Mental Status: She is alert and oriented to person, place, and time.  Psychiatric:        Mood and Affect: Mood normal.        Behavior: Behavior normal.     BP 124/82 (BP Location: Left Arm, Patient Position: Sitting, Cuff Size: Normal)   Pulse 68   Wt 130 lb (59 kg)   SpO2 96%   BMI 24.56 kg/m  Wt Readings from Last 3 Encounters:  09/21/18 130 lb (59 kg)  08/27/18 129 lb (58.5 kg)   05/04/18 136 lb (61.7 kg)    Diabetic Foot Exam - Simple   No data filed     Lab Results  Component Value Date   WBC 4.8 02/23/2018   HGB 12.5 02/23/2018   HCT 37.4 02/23/2018   PLT 305.0 02/23/2018   GLUCOSE 89 02/23/2018   CHOL 213 (H) 02/23/2018   TRIG 109.0 02/23/2018   HDL 63.80 02/23/2018   LDLCALC 127 (H) 02/23/2018   ALT 12 02/23/2018   AST 19 02/23/2018   NA 139 02/23/2018   K 4.5 02/23/2018   CL 102 02/23/2018   CREATININE 0.58 02/23/2018   BUN 7 02/23/2018   CO2 32 02/23/2018   TSH 3.41 02/23/2018    Lab Results  Component Value Date   TSH 3.41 02/23/2018   Lab Results  Component Value Date   WBC 4.8 02/23/2018   HGB 12.5 02/23/2018   HCT 37.4 02/23/2018   MCV 90.6 02/23/2018   PLT 305.0 02/23/2018   Lab Results  Component Value Date   NA 139 02/23/2018   K 4.5 02/23/2018   CO2 32 02/23/2018   GLUCOSE 89 02/23/2018   BUN 7 02/23/2018   CREATININE 0.58 02/23/2018   BILITOT 0.4 02/23/2018   ALKPHOS 53 02/23/2018   AST 19 02/23/2018   ALT 12 02/23/2018   PROT 7.4 02/23/2018   ALBUMIN 4.3 02/23/2018   CALCIUM 9.6 02/23/2018   ANIONGAP 10 09/08/2015   GFR 119.90 02/23/2018   Lab Results  Component Value Date   CHOL 213 (H) 02/23/2018   Lab Results  Component Value Date   HDL 63.80 02/23/2018   Lab Results  Component Value Date   LDLCALC  127 (H) 02/23/2018   Lab Results  Component Value Date   TRIG 109.0 02/23/2018   Lab Results  Component Value Date   CHOLHDL 3 02/23/2018   No results found for: HGBA1C     Assessment & Plan:   Problem List Items Addressed This Visit    Tinea corporis    Given rx for Ketoconazole shampoo 2% once to twice weekly.       Relevant Medications   fluconazole (DIFLUCAN) 150 MG tablet   ketoconazole (NIZORAL) 2 % shampoo (Start on 09/24/2018)   Anxiety and depression    Has been more stressed during the pandemic but has had to use medications in the past. She failed Sertraline and Wellbutrin  and did best on Lexapro just 2.5 mg in the past. So she will start Escitalopram 2.5 mg daily, can increase to 5 mg as tolerated. Has been allowed Alprazolam 0.25 mg tabs, 1/2 to 1 tab po bid prn anxiety, reassess in 8-12 weeks. Spent 20 minutes of a 25 minute visit in counseling.       Relevant Medications   ALPRAZolam (XANAX) 0.25 MG tablet      I am having Cerinity H. Redmann start on ALPRAZolam, fluconazole, and ketoconazole. I am also having her maintain her cyclobenzaprine, atenolol, aspirin, hydrochlorothiazide, omeprazole, diltiazem, triamcinolone cream, meloxicam, nitroGLYCERIN, levothyroxine, budesonide, Xopenex HFA, diclofenac sodium, methocarbamol, tiZANidine, and ACETAMINOPHEN-BUTALBITAL.  Meds ordered this encounter  Medications  . ALPRAZolam (XANAX) 0.25 MG tablet    Sig: Take 1 tablet (0.25 mg total) by mouth 2 (two) times daily as needed for anxiety.    Dispense:  20 tablet    Refill:  2  . fluconazole (DIFLUCAN) 150 MG tablet    Sig: Take 1 tablet (150 mg total) by mouth once a week.    Dispense:  2 tablet    Refill:  1  . ketoconazole (NIZORAL) 2 % shampoo    Sig: Apply 1 application topically 2 (two) times a week.    Dispense:  120 mL    Refill:  1  I discussed the assessment and treatment plan with the patient. The patient was provided an opportunity to ask questions and all were answered. The patient agreed with the plan and demonstrated an understanding of the instructions.   The patient was advised to call back or seek an in-person evaluation if the symptoms worsen or if the condition fails to improve as anticipated.  I provided 25 minutes of non-face-to-face time during this encounter.   Penni Homans, MD

## 2018-09-21 NOTE — Assessment & Plan Note (Signed)
Given rx for Ketoconazole shampoo 2% once to twice weekly.

## 2018-10-26 DIAGNOSIS — E782 Mixed hyperlipidemia: Secondary | ICD-10-CM | POA: Diagnosis not present

## 2018-10-26 DIAGNOSIS — Z9889 Other specified postprocedural states: Secondary | ICD-10-CM | POA: Diagnosis not present

## 2018-10-26 DIAGNOSIS — G4733 Obstructive sleep apnea (adult) (pediatric): Secondary | ICD-10-CM | POA: Diagnosis not present

## 2018-10-26 DIAGNOSIS — R002 Palpitations: Secondary | ICD-10-CM | POA: Diagnosis not present

## 2018-11-02 ENCOUNTER — Ambulatory Visit: Payer: BLUE CROSS/BLUE SHIELD | Admitting: Family Medicine

## 2018-11-22 ENCOUNTER — Other Ambulatory Visit: Payer: Self-pay

## 2018-11-22 ENCOUNTER — Ambulatory Visit (INDEPENDENT_AMBULATORY_CARE_PROVIDER_SITE_OTHER): Payer: BC Managed Care – PPO

## 2018-11-22 DIAGNOSIS — Z23 Encounter for immunization: Secondary | ICD-10-CM

## 2019-02-25 ENCOUNTER — Telehealth: Payer: Self-pay | Admitting: *Deleted

## 2019-02-25 DIAGNOSIS — E079 Disorder of thyroid, unspecified: Secondary | ICD-10-CM

## 2019-02-25 DIAGNOSIS — E782 Mixed hyperlipidemia: Secondary | ICD-10-CM

## 2019-02-25 NOTE — Telephone Encounter (Signed)
Labs ordered please schedule for lab appointment

## 2019-02-25 NOTE — Telephone Encounter (Signed)
Copied from [place] [id number]. Topic: General - Inquiry >> Feb 22, 2019  1:33 PM Richardo Priest, NT wrote: Reason for CRM: Pt is called in stating she would like to have her blood drawn prior to the appointment. Please advise.

## 2019-02-26 ENCOUNTER — Other Ambulatory Visit: Payer: Self-pay

## 2019-02-26 NOTE — Telephone Encounter (Signed)
Mailbox full - unable to schedule

## 2019-02-28 ENCOUNTER — Other Ambulatory Visit: Payer: Self-pay

## 2019-02-28 ENCOUNTER — Encounter: Payer: Self-pay | Admitting: Family Medicine

## 2019-02-28 ENCOUNTER — Ambulatory Visit (INDEPENDENT_AMBULATORY_CARE_PROVIDER_SITE_OTHER): Payer: BC Managed Care – PPO | Admitting: Family Medicine

## 2019-02-28 VITALS — BP 100/62 | HR 70 | Temp 97.2°F | Resp 18 | Wt 131.0 lb

## 2019-02-28 DIAGNOSIS — D509 Iron deficiency anemia, unspecified: Secondary | ICD-10-CM

## 2019-02-28 DIAGNOSIS — E782 Mixed hyperlipidemia: Secondary | ICD-10-CM

## 2019-02-28 DIAGNOSIS — Z0000 Encounter for general adult medical examination without abnormal findings: Secondary | ICD-10-CM

## 2019-02-28 DIAGNOSIS — R030 Elevated blood-pressure reading, without diagnosis of hypertension: Secondary | ICD-10-CM | POA: Diagnosis not present

## 2019-02-28 DIAGNOSIS — E079 Disorder of thyroid, unspecified: Secondary | ICD-10-CM | POA: Diagnosis not present

## 2019-02-28 DIAGNOSIS — R591 Generalized enlarged lymph nodes: Secondary | ICD-10-CM

## 2019-02-28 MED ORDER — ATENOLOL 25 MG PO TABS: 25.0000 mg | ORAL_TABLET | Freq: Every day | ORAL | 3 refills | Status: DC

## 2019-02-28 NOTE — Assessment & Plan Note (Signed)
Patient encouraged to maintain heart healthy diet, regular exercise, adequate sleep. Consider daily probiotics. Take medications as prescribed 

## 2019-02-28 NOTE — Progress Notes (Signed)
Subjective:    Patient ID: Josephina H Calabria, female    DOB: 03-20-1975, 44 y.o.   MRN: [id number]  Chief Complaint  Patient presents with  . Annual Exam    HPI Patient is in today for annual preventative exam and follow-up on chronic medical concerns including hyperlipidemia, anemia and hypertension.  She feels well.  No recent febrile illness or hospitalization.  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  Gets her first Covid vaccine tomorrow.  Her only complaint is occasional episodes of feeling lightheaded or dizzy.  She added some low-dose iron when this first started but does note it is slightly worse it can happen anytime of the day no falls or syncope no true spinning sensation.  No recent trauma or other acute concerns.  Her palpitations have continued to improve status post her already at ablation.  Her breathing and respiratory concerns are doing very well.  She is trying to maintain a heart healthy diet and stay active. Denies CP/palp/SOB/HA/congestion/fevers/GI or GU c/o. Taking meds as prescribed  Past Medical History:  Diagnosis Date  . Allergy    childhood allergies, hives.   . Anemia 04/15/2016  . Anxiety and depression 09/21/2018  . Back pain 09/28/2014  . Back pain affecting pregnancy, antepartum 09/28/2014  . Cervical cancer screening 01/27/2015   Menarche at 11 Regular and moderate flow No history of abnormal pap in past No sexual activity G0P0, s/p Nohistory of abnormal MGM No concerns today No gyn surgeries  . FH: breast cancer 09/28/2014  . Hyperlipidemia, mixed 09/28/2014  . Hypertension   . Pruritus 09/28/2014  . Reactive airway disease 04/15/2016  . Seasonal allergies   . SVT (supraventricular tachycardia) ([place])   . Thyroid disease     History reviewed. No pertinent surgical history.  Family History  Problem Relation Age of Onset  . Heart disease Father   . Pulmonary fibrosis Father        pneumonia  . Cancer  Sister 80       cervical cancer  . Hypertension Sister   . Hyperlipidemia Sister   . Gout Sister   . Diabetes Mother   . Hypertension Mother   . Hyperlipidemia Mother   . Cancer Mother        cervical cancer  . Diabetes Maternal Grandmother   . Stroke Paternal Grandmother   . Heart disease Paternal Grandfather        MI  . Hypertension Sister   . Cholelithiasis Sister   . Thyroid disease Sister   . Ovarian cysts Sister   . Cancer Sister        breast  . Asthma Sister     Social History   Socioeconomic History  . Marital status: Single    Spouse name: Not on file  . Number of children: 0  . Years of education: Not on file  . Highest education level: Not on file  Occupational History  . Occupation: DENTIST    Employer: SELF EMPLOYED  Tobacco Use  . Smoking status: Never Smoker  . Smokeless tobacco: Never Used  Substance and Sexual Activity  . Alcohol use: No  . Drug use: No  . 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 Not on file  Social History Narrative  . Not on file   Social Determinants of Health   Financial Resource  Strain:   . Difficulty of Paying Living Expenses: Not on file  Food Insecurity:   . Worried About Charity fundraiser in the Last Year: Not on file  . Ran Out of Food in the Last Year: Not on file  Transportation Needs:   . Lack of Transportation (Medical): Not on file  . Lack of Transportation (Non-Medical): Not on file  Physical Activity:   . Days of Exercise per Week: Not on file  . Minutes of Exercise per Session: Not on file  Stress:   . Feeling of Stress : Not on file  Social Connections:   . Frequency of Communication with Friends and Family: Not on file  . Frequency of Social Gatherings with Friends and Family: Not on file  . Attends Religious Services: Not on file  . Active Member of Clubs or Organizations: Not on file  . Attends Archivist  Meetings: Not on file  . Marital Status: Not on file  Intimate Partner Violence:   . Fear of Current or Ex-Partner: Not on file  . Emotionally Abused: Not on file  . Physically Abused: Not on file  . Sexually Abused: Not on file    Outpatient Medications Prior to Visit  Medication Sig Dispense Refill  . ACETAMINOPHEN-BUTALBITAL 50-325 MG TABS Take 1 tablet by mouth 3 (three) times daily as needed. 120 tablet 0  . ALPRAZolam (XANAX) 0.25 MG tablet Take 1 tablet (0.25 mg total) by mouth 2 (two) times daily as needed for anxiety. 20 tablet 2  . aspirin 325 MG tablet Take 325 mg by mouth daily.    . budesonide (RHINOCORT AQUA) 32 MCG/ACT nasal spray Place 2 sprays into both nostrils daily. 8.6 g 2  . cyclobenzaprine (FLEXERIL) 10 MG tablet Take 10 mg by mouth 3 (three) times daily as needed for muscle spasms.    . diclofenac sodium (VOLTAREN) 1 % GEL Apply 4 g topically 4 (four) times daily. 3 Tube 1  . diltiazem (CARDIZEM) 30 MG tablet Take 1 tablet by mouth as needed every four to six hours for sustained heart rates greater than 100 bpm.    . hydrochlorothiazide (HYDRODIURIL) 25 MG tablet Take 25 mg by mouth daily as needed (fluid/hypertension).    Marland Kitchen ketoconazole (NIZORAL) 2 % shampoo Apply 1 application topically 2 (two) times a week. 120 mL 1  . levothyroxine (SYNTHROID) 25 MCG tablet Take 1 tablet (25 mcg total) by mouth daily before breakfast. 90 tablet 0  . meloxicam (MOBIC) 15 MG tablet Take 1 tablet (15 mg total) by mouth daily as needed for pain. 30 tablet 2  . methocarbamol (ROBAXIN) 500 MG tablet Take 1 tablet (500 mg total) by mouth every 8 (eight) hours as needed. 60 tablet 1  . nitroGLYCERIN (NITRODUR - DOSED IN MG/24 HR) 0.2 mg/hr patch Apply 1/4th patch to affected shoulder, change daily 30 patch 1  . omeprazole (PRILOSEC) 20 MG capsule Take 20 mg by mouth daily.    Marland Kitchen triamcinolone cream (KENALOG) 0.1 % Apply topically 2 (two) times daily. Apply topically 30 g 3  . XOPENEX HFA  45 MCG/ACT inhaler Inhale 1-2 puffs into the lungs every 6 (six) hours as needed for wheezing. 1 Inhaler 12  . atenolol (TENORMIN) 50 MG tablet Take 1.5 tablets (75 mg total) by mouth daily. (Patient taking differently: Take 25 mg by mouth daily. ) 135 tablet 1  . fluconazole (DIFLUCAN) 150 MG tablet Take 1 tablet (150 mg total) by mouth once a week.  2 tablet 1  . tiZANidine (ZANAFLEX) 2 MG tablet Take 0.5-2 tablets (1-4 mg total) by mouth 2 (two) times daily as needed for muscle spasms (headaches). 30 tablet 0   No facility-administered medications prior to visit.    Allergies  Allergen Reactions  . Amoxicillin Other ([place] Comments)    Rash  . Nitrous Oxide Other ([place] Comments)    Paradoxical response-got very hyper  . Penicillins Other ([place] Comments)    Rash  . Sodium Chloride Swelling    And increased BP    Review of Systems  Constitutional: Negative for chills, fever and malaise/fatigue.  HENT: Negative for congestion and hearing loss.   Eyes: Negative for discharge.  Respiratory: Negative for cough, sputum production and shortness of breath.   Cardiovascular: Negative for chest pain, palpitations and leg swelling.  Gastrointestinal: Negative for abdominal pain, blood in stool, constipation, diarrhea, heartburn, nausea and vomiting.  Genitourinary: Negative for dysuria, frequency, hematuria and urgency.  Musculoskeletal: Negative for back pain, falls and myalgias.  Skin: Negative for rash.  Neurological: Positive for dizziness. Negative for sensory change, loss of consciousness, weakness and headaches.  Endo/Heme/Allergies: Negative for environmental allergies. Does not bruise/bleed easily.  Psychiatric/Behavioral: Negative for depression and suicidal ideas. The patient is not nervous/anxious and does not have insomnia.        Objective:    Physical Exam Constitutional:      General: She is not in acute distress.    Appearance: She is well-developed.  HENT:     Head:  Normocephalic and atraumatic.  Eyes:     Conjunctiva/sclera: Conjunctivae normal.  Neck:     Thyroid: No thyromegaly.  Cardiovascular:     Rate and Rhythm: Normal rate and regular rhythm.     Heart sounds: Normal heart sounds. No murmur.  Pulmonary:     Effort: Pulmonary effort is normal. No respiratory distress.     Breath sounds: Normal breath sounds.  Abdominal:     General: Bowel sounds are normal. There is no distension.     Palpations: Abdomen is soft. There is no mass.     Tenderness: There is no abdominal tenderness.  Musculoskeletal:     Cervical back: Neck supple.  Lymphadenopathy:     Cervical: No cervical adenopathy.  Skin:    General: Skin is warm and dry.  Neurological:     Mental Status: She is alert and oriented to person, place, and time.  Psychiatric:        Behavior: Behavior normal.     BP 100/62 (BP Location: Left Arm, Patient Position: Sitting, Cuff Size: Normal)   Pulse 70   Temp (!) 97.2 F (36.2 C) (Temporal)   Resp 18   Wt 131 lb (59.4 kg)   SpO2 98%   BMI 24.75 kg/m  Wt Readings from Last 3 Encounters:  02/28/19 131 lb (59.4 kg)  09/21/18 130 lb (59 kg)  08/27/18 129 lb (58.5 kg)    Diabetic Foot Exam - Simple   No data filed     Lab Results  Component Value Date   WBC 4.8 02/23/2018   HGB 12.5 02/23/2018   HCT 37.4 02/23/2018   PLT 305.0 02/23/2018   GLUCOSE 89 02/23/2018   CHOL 213 (H) 02/23/2018   TRIG 109.0 02/23/2018   HDL 63.80 02/23/2018   LDLCALC 127 (H) 02/23/2018   ALT 12 02/23/2018   AST 19 02/23/2018   NA 139 02/23/2018   K 4.5 02/23/2018   CL 102 02/23/2018  CREATININE 0.58 02/23/2018   BUN 7 02/23/2018   CO2 32 02/23/2018   TSH 3.41 02/23/2018    Lab Results  Component Value Date   TSH 3.41 02/23/2018   Lab Results  Component Value Date   WBC 4.8 02/23/2018   HGB 12.5 02/23/2018   HCT 37.4 02/23/2018   MCV 90.6 02/23/2018   PLT 305.0 02/23/2018   Lab Results  Component Value Date   NA 139  02/23/2018   K 4.5 02/23/2018   CO2 32 02/23/2018   GLUCOSE 89 02/23/2018   BUN 7 02/23/2018   CREATININE 0.58 02/23/2018   BILITOT 0.4 02/23/2018   ALKPHOS 53 02/23/2018   AST 19 02/23/2018   ALT 12 02/23/2018   PROT 7.4 02/23/2018   ALBUMIN 4.3 02/23/2018   CALCIUM 9.6 02/23/2018   ANIONGAP 10 09/08/2015   GFR 119.90 02/23/2018   Lab Results  Component Value Date   CHOL 213 (H) 02/23/2018   Lab Results  Component Value Date   HDL 63.80 02/23/2018   Lab Results  Component Value Date   LDLCALC 127 (H) 02/23/2018   Lab Results  Component Value Date   TRIG 109.0 02/23/2018   Lab Results  Component Value Date   CHOLHDL 3 02/23/2018   No results found for: HGBA1C     Assessment & Plan:   Problem List Items Addressed This Visit    Thyroid disease - Primary   Relevant Medications   atenolol (TENORMIN) 25 MG tablet   Other Relevant Orders   TSH   T4, free   Hyperlipidemia, mixed    Encouraged heart healthy diet, increase exercise, avoid trans fats, consider a krill oil cap daily      Relevant Medications   atenolol (TENORMIN) 25 MG tablet   Other Relevant Orders   Lipid panel   Elevated blood pressure reading   Relevant Orders   CMP   Anemia    Increase leafy greens, consider increased lean red meat and using cast iron cookware. Continue to monitor, report any concerns      Relevant Orders   Iron, TIBC and Ferritin Panel   Preventative health care    Patient encouraged to maintain heart healthy diet, regular exercise, adequate sleep. Consider daily probiotics. Take medications as prescribed       Other Visit Diagnoses    Lymphadenopathy       Relevant Orders   CBC w/Diff      I have discontinued Liela H. Rohr's atenolol, tiZANidine, and fluconazole. I am also having her start on atenolol. Additionally, I am having her maintain her cyclobenzaprine, aspirin, hydrochlorothiazide, omeprazole, diltiazem, triamcinolone cream, meloxicam, nitroGLYCERIN,  levothyroxine, budesonide, Xopenex HFA, diclofenac sodium, methocarbamol, ACETAMINOPHEN-BUTALBITAL, ALPRAZolam, and ketoconazole.  Meds ordered this encounter  Medications  . atenolol (TENORMIN) 25 MG tablet    Sig: Take 1 tablet (25 mg total) by mouth daily.    Dispense:  90 tablet    Refill:  3    D /c prev script     Penni Homans, MD

## 2019-02-28 NOTE — Assessment & Plan Note (Signed)
Encouraged heart healthy diet, increase exercise, avoid trans fats, consider a krill oil cap daily 

## 2019-02-28 NOTE — Patient Instructions (Addendum)
Multivitamin with minerals, selenium Vitamin D 02-1998 IU daily Probiotic daily Melatonin 2,5-5 mg  Preventive Care 34-46 Years Old, Female Preventive care refers to visits with your health care provider and lifestyle choices that can promote health and wellness. This includes:  A yearly physical exam. This may also be called an annual well check.  Regular dental visits and eye exams.  Immunizations.  Screening for certain conditions.  Healthy lifestyle choices, such as eating a healthy diet, getting regular exercise, not using drugs or products that contain nicotine and tobacco, and limiting alcohol use. What can I expect for my preventive care visit? Physical exam Your health care provider will check your:  Height and weight. This may be used to calculate body mass index (BMI), which tells if you are at a healthy weight.  Heart rate and blood pressure.  Skin for abnormal spots. Counseling Your health care provider may ask you questions about your:  Alcohol, tobacco, and drug use.  Emotional well-being.  Home and relationship well-being.  Sexual activity.  Eating habits.  Work and work Statistician.  Method of birth control.  Menstrual cycle.  Pregnancy history. What immunizations do I need?  Influenza (flu) vaccine  This is recommended every year. Tetanus, diphtheria, and pertussis (Tdap) vaccine  You may need a Td booster every 10 years. Varicella (chickenpox) vaccine  You may need this if you have not been vaccinated. Human papillomavirus (HPV) vaccine  If recommended by your health care provider, you may need three doses over 6 months. Measles, mumps, and rubella (MMR) vaccine  You may need at least one dose of MMR. You may also need a second dose. Meningococcal conjugate (MenACWY) vaccine  One dose is recommended if you are age 39-21 years and a first-year college student living in a residence hall, or if you have one of several medical conditions.  You may also need additional booster doses. Pneumococcal conjugate (PCV13) vaccine  You may need this if you have certain conditions and were not previously vaccinated. Pneumococcal polysaccharide (PPSV23) vaccine  You may need one or two doses if you smoke cigarettes or if you have certain conditions. Hepatitis A vaccine  You may need this if you have certain conditions or if you travel or work in places where you may be exposed to hepatitis A. Hepatitis B vaccine  You may need this if you have certain conditions or if you travel or work in places where you may be exposed to hepatitis B. Haemophilus influenzae type b (Hib) vaccine  You may need this if you have certain conditions. You may receive vaccines as individual doses or as more than one vaccine together in one shot (combination vaccines). Talk with your health care provider about the risks and benefits of combination vaccines. What tests do I need?  Blood tests  Lipid and cholesterol levels. These may be checked every 5 years starting at age 46.  Hepatitis C test.  Hepatitis B test. Screening  Diabetes screening. This is done by checking your blood sugar (glucose) after you have not eaten for a while (fasting).  Sexually transmitted disease (STD) testing.  BRCA-related cancer screening. This may be done if you have a family history of breast, ovarian, tubal, or peritoneal cancers.  Pelvic exam and Pap test. This may be done every 3 years starting at age 46. Starting at age 46, this may be done every 5 years if you have a Pap test in combination with an HPV test. Talk with your health care  care provider about your test results, treatment options, and if necessary, the need for more tests. Follow these instructions at home: Eating and drinking   Eat a diet that includes fresh fruits and vegetables, whole grains, lean protein, and low-fat dairy.  Take vitamin and mineral supplements as recommended by your health care  provider.  Do not drink alcohol if: ? Your health care provider tells you not to drink. ? You are pregnant, may be pregnant, or are planning to become pregnant.  If you drink alcohol: ? Limit how much you have to 0-1 drink a day. ? Be aware of how much alcohol is in your drink. In the [place] one drink equals one 12 oz bottle of beer (355 mL), one 5 oz glass of wine (148 mL), or one 1 oz glass of hard liquor (44 mL). Lifestyle  Take daily care of your teeth and gums.  Stay active. Exercise for at least 30 minutes on 5 or more days each week.  Do not use any products that contain nicotine or tobacco, such as cigarettes, e-cigarettes, and chewing tobacco. If you need help quitting, ask your health care provider.  If you are sexually active, practice safe sex. Use a condom or other form of birth control (contraception) in order to prevent pregnancy and STIs (sexually transmitted infections). If you plan to become pregnant, [place] your health care provider for a preconception visit. What's next?  Visit your health care provider once a year for a well check visit.  Ask your health care provider how often you should have your eyes and teeth checked.  Stay up to date on all vaccines. This information is not intended to replace advice given to you by your health care provider. Make sure you discuss any questions you have with your health care provider. Document Revised: 10/12/2017 Document Reviewed: 10/12/2017 Elsevier Patient Education  2020 [employer]  

## 2019-02-28 NOTE — Assessment & Plan Note (Signed)
Increase leafy greens, consider increased lean red meat and using cast iron cookware. Continue to monitor, report any concerns 

## 2019-03-01 ENCOUNTER — Other Ambulatory Visit: Payer: Self-pay

## 2019-03-01 ENCOUNTER — Other Ambulatory Visit (INDEPENDENT_AMBULATORY_CARE_PROVIDER_SITE_OTHER): Payer: BC Managed Care – PPO

## 2019-03-01 DIAGNOSIS — E782 Mixed hyperlipidemia: Secondary | ICD-10-CM | POA: Diagnosis not present

## 2019-03-01 DIAGNOSIS — E079 Disorder of thyroid, unspecified: Secondary | ICD-10-CM | POA: Diagnosis not present

## 2019-03-01 LAB — COMPREHENSIVE METABOLIC PANEL
ALT: 7 U/L (ref 0–35)
AST: 17 U/L (ref 0–37)
Albumin: 4.6 g/dL (ref 3.5–5.2)
Alkaline Phosphatase: 42 U/L (ref 39–117)
BUN: 6 mg/dL (ref 6–23)
CO2: 29 mEq/L (ref 19–32)
Calcium: 9.2 mg/dL (ref 8.4–10.5)
Chloride: 101 mEq/L (ref 96–112)
Creatinine, Ser: 0.62 mg/dL (ref 0.40–1.20)
GFR: 103.98 mL/min (ref >60.00)
Glucose, Bld: 101 mg/dL — ABNORMAL HIGH (ref 70–99)
Potassium: 4.1 mEq/L (ref 3.5–5.1)
Sodium: 138 mEq/L (ref 135–145)
Total Bilirubin: 0.4 mg/dL (ref 0.2–1.2)
Total Protein: 7.9 g/dL (ref 6.0–8.3)

## 2019-03-01 LAB — LIPID PANEL
Cholesterol: 204 mg/dL — ABNORMAL HIGH (ref 0–200)
HDL: 66.7 mg/dL (ref >39.00)
LDL Cholesterol: 126 mg/dL — ABNORMAL HIGH (ref 0–99)
NonHDL: 137.56
Total CHOL/HDL Ratio: 3
Triglycerides: 60 mg/dL (ref 0.0–149.0)
VLDL: 12 mg/dL (ref 0.0–40.0)

## 2019-03-01 LAB — CBC
HCT: 36.5 % (ref 36.0–46.0)
Hemoglobin: 12 g/dL (ref 12.0–15.0)
MCHC: 32.9 g/dL (ref 30.0–36.0)
MCV: 92.5 fl (ref 78.0–100.0)
Platelets: 309 10*3/uL (ref 150.0–400.0)
RBC: 3.95 Mil/uL (ref 3.87–5.11)
RDW: 12.2 % (ref 11.5–15.5)
WBC: 4.9 10*3/uL (ref 4.0–10.5)

## 2019-03-01 LAB — TSH: TSH: 2.92 u[IU]/mL (ref 0.35–4.50)

## 2019-04-26 DIAGNOSIS — R002 Palpitations: Secondary | ICD-10-CM | POA: Diagnosis not present

## 2019-04-26 DIAGNOSIS — G4733 Obstructive sleep apnea (adult) (pediatric): Secondary | ICD-10-CM | POA: Diagnosis not present

## 2019-04-26 DIAGNOSIS — E782 Mixed hyperlipidemia: Secondary | ICD-10-CM | POA: Diagnosis not present

## 2019-04-26 DIAGNOSIS — Z9889 Other specified postprocedural states: Secondary | ICD-10-CM | POA: Diagnosis not present

## 2019-05-24 ENCOUNTER — Ambulatory Visit: Payer: BC Managed Care – PPO | Admitting: Family Medicine

## 2019-05-24 ENCOUNTER — Other Ambulatory Visit: Payer: Self-pay

## 2019-05-24 VITALS — BP 122/70 | Ht 61.0 in | Wt 132.0 lb

## 2019-05-24 DIAGNOSIS — M545 Low back pain, unspecified: Secondary | ICD-10-CM

## 2019-05-24 NOTE — Patient Instructions (Addendum)
We will get some bloodwork (markers of inflammation - CRP and ESR) and contact you with these results. Head over to [facility] and get your blood drawn when you leave here:  [address], [place], [place] [address]  If these are normal I would recommend you start physical therapy at this time. It's still likely this is all muscular affecting your pectoralis and posture muscles of your back. Continue with tylenol, topical medications (voltaren gel). Heat 15 minutes at a time 3-4 times a day. Follow up with me in about 6 weeks - ok to do so on a Friday morning if that's best for you.

## 2019-05-25 LAB — SEDIMENTATION RATE: Sed Rate: 9 mm/hr (ref 0–32)

## 2019-05-25 LAB — C-REACTIVE PROTEIN: CRP: <1 mg/L (ref 0–10)

## 2019-05-27 ENCOUNTER — Encounter: Payer: Self-pay | Admitting: Family Medicine
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
## 2019-06-03 DIAGNOSIS — R531 Weakness: Secondary | ICD-10-CM | POA: Diagnosis not present

## 2019-06-03 DIAGNOSIS — M545 Low back pain: Secondary | ICD-10-CM | POA: Diagnosis not present

## 2019-06-03 DIAGNOSIS — R2689 Other abnormalities of gait and mobility: Secondary | ICD-10-CM | POA: Diagnosis not present

## 2019-06-03 DIAGNOSIS — M542 Cervicalgia: Secondary | ICD-10-CM | POA: Diagnosis not present

## 2019-06-04 ENCOUNTER — Other Ambulatory Visit: Payer: Self-pay

## 2019-06-04 ENCOUNTER — Telehealth (INDEPENDENT_AMBULATORY_CARE_PROVIDER_SITE_OTHER): Payer: BC Managed Care – PPO | Admitting: Family Medicine

## 2019-06-04 DIAGNOSIS — E782 Mixed hyperlipidemia: Secondary | ICD-10-CM

## 2019-06-04 DIAGNOSIS — I10 Essential (primary) hypertension: Secondary | ICD-10-CM | POA: Diagnosis not present

## 2019-06-04 DIAGNOSIS — E079 Disorder of thyroid, unspecified: Secondary | ICD-10-CM | POA: Diagnosis not present

## 2019-06-04 DIAGNOSIS — I471 Supraventricular tachycardia: Secondary | ICD-10-CM

## 2019-06-04 MED ORDER — HYDROCHLOROTHIAZIDE 12.5 MG PO CAPS: 12.5000 mg | ORAL_CAPSULE | Freq: Every day | ORAL | 3 refills | Status: DC

## 2019-06-04 MED ORDER — DILTIAZEM HCL 30 MG PO TABS: 30.0000 mg | ORAL_TABLET | Freq: Two times a day (BID) | ORAL | 2 refills | Status: DC

## 2019-06-04 NOTE — Assessment & Plan Note (Signed)
Increase Diltiazem to 30 mg to bid and drop the HCTZ to 12.5 mg daily as she did not tolerate the 25 mg dose. No change to Atenolol.

## 2019-06-04 NOTE — Assessment & Plan Note (Signed)
Rate controlled recently

## 2019-06-04 NOTE — Assessment & Plan Note (Signed)
Encouraged heart healthy diet, increase exercise, avoid trans fats, consider a krill oil cap daily 

## 2019-06-04 NOTE — Assessment & Plan Note (Signed)
On Levothyroxine, continue to monitor 

## 2019-06-04 NOTE — Progress Notes (Signed)
Virtual Visit via Video Note  I connected with [name] on 06/04/19 at  3:00 PM EDT by a video enabled telemedicine application and verified that I am speaking with the correct person using two identifiers.  Location: Patient: office Provider: office   I discussed the limitations of evaluation and management by telemedicine and the availability of in person appointments. The patient expressed understanding and agreed to proceed. April Jackson, CMA was able to get the patient set up on a video visit   Subjective:    Patient ID: April Jackson, female    DOB: 1975/09/30, 44 y.o.   MRN: [id number]  No chief complaint on file.   HPI Patient is in today for evaluation of her high blood pressure. She has not been tolerating her hydrochlorothiazide at 25 mg and she is hoping to decrease dosing and adjust other meds to compensate. No recent febrile illnesss or hospitalizations. No polyuria or polydipsia noted. Denies CP/palp/SOB/HA/congestion/fevers/GI or GU c/o. Taking meds as prescribed  Past Medical History:  Diagnosis Date  . Allergy    childhood allergies, hives.   . Anemia 04/15/2016  . Anxiety and depression 09/21/2018  . Back pain 09/28/2014  . Back pain affecting pregnancy, antepartum 09/28/2014  . Cervical cancer screening 01/27/2015   Menarche at 11 Regular and moderate flow No history of abnormal pap in past No sexual activity G0P0, s/p Nohistory of abnormal MGM No concerns today No gyn surgeries  . FH: breast cancer 09/28/2014  . Hyperlipidemia, mixed 09/28/2014  . Hypertension   . Pruritus 09/28/2014  . Reactive airway disease 04/15/2016  . Seasonal allergies   . SVT (supraventricular tachycardia) ([place])   . Thyroid disease     No past surgical history on file.  Family History  Problem Relation Age of Onset  . Heart disease Father   . Pulmonary fibrosis Father        pneumonia  . Cancer Sister 50       cervical cancer  . Hypertension Sister   . Hyperlipidemia Sister    . Gout Sister   . Diabetes Mother   . Hypertension Mother   . Hyperlipidemia Mother   . Cancer Mother        cervical cancer  . Diabetes Maternal Grandmother   . Stroke Paternal Grandmother   . Heart disease Paternal Grandfather        MI  . Hypertension Sister   . Cholelithiasis Sister   . Thyroid disease Sister   . Ovarian cysts Sister   . Cancer Sister        breast  . Asthma Sister     Social History   Socioeconomic History  . Marital status: Single    Spouse name: Not on file  . Number of children: 0  . Years of education: Not on file  . Highest education level: Not on file  Occupational History  . Occupation: DENTIST    Employer: SELF EMPLOYED  Tobacco Use  . Smoking status: Never Smoker  . Smokeless tobacco: Never Used  Substance and Sexual Activity  . Alcohol use: No  . Drug use: No  . 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 Not on file  Social History Narrative  . Not on file   Social Determinants of Health   Financial Resource Strain:   . Difficulty of Paying Living Expenses:   Food Insecurity:   . Worried  About Running Out of Food in the Last Year:   . Tiger in the Last Year:   Transportation Needs:   . Lack of Transportation (Medical):   Marland Kitchen Lack of Transportation (Non-Medical):   Physical Activity:   . Days of Exercise per Week:   . Minutes of Exercise per Session:   Stress:   . Feeling of Stress :   Social Connections:   . Frequency of Communication with Friends and Family:   . Frequency of Social Gatherings with Friends and Family:   . Attends Religious Services:   . Active Member of Clubs or Organizations:   . Attends Archivist Meetings:   Marland Kitchen Marital Status:   Intimate Partner Violence:   . Fear of Current or Ex-Partner:   . Emotionally Abused:   Marland Kitchen Physically Abused:   . Sexually Abused:     Outpatient Medications Prior to Visit    Medication Sig Dispense Refill  . ACETAMINOPHEN-BUTALBITAL 50-325 MG TABS Take 1 tablet by mouth 3 (three) times daily as needed. 120 tablet 0  . ALPRAZolam (XANAX) 0.25 MG tablet Take 1 tablet (0.25 mg total) by mouth 2 (two) times daily as needed for anxiety. 20 tablet 2  . aspirin 325 MG tablet Take 325 mg by mouth daily.    Marland Kitchen atenolol (TENORMIN) 25 MG tablet Take 1 tablet (25 mg total) by mouth daily. 90 tablet 3  . budesonide (RHINOCORT AQUA) 32 MCG/ACT nasal spray Place 2 sprays into both nostrils daily. 8.6 g 2  . cyclobenzaprine (FLEXERIL) 10 MG tablet Take 10 mg by mouth 3 (three) times daily as needed for muscle spasms.    . diclofenac sodium (VOLTAREN) 1 % GEL Apply 4 g topically 4 (four) times daily. 3 Tube 1  . ketoconazole (NIZORAL) 2 % shampoo Apply 1 application topically 2 (two) times a week. 120 mL 1  . levothyroxine (SYNTHROID) 25 MCG tablet Take 1 tablet (25 mcg total) by mouth daily before breakfast. 90 tablet 0  . meloxicam (MOBIC) 15 MG tablet Take 1 tablet (15 mg total) by mouth daily as needed for pain. 30 tablet 2  . methocarbamol (ROBAXIN) 500 MG tablet Take 1 tablet (500 mg total) by mouth every 8 (eight) hours as needed. 60 tablet 1  . nitroGLYCERIN (NITRODUR - DOSED IN MG/24 HR) 0.2 mg/hr patch Apply 1/4th patch to affected shoulder, change daily 30 patch 1  . omeprazole (PRILOSEC) 20 MG capsule Take 20 mg by mouth daily.    Marland Kitchen triamcinolone cream (KENALOG) 0.1 % Apply topically 2 (two) times daily. Apply topically 30 g 3  . XOPENEX HFA 45 MCG/ACT inhaler Inhale 1-2 puffs into the lungs every 6 (six) hours as needed for wheezing. 1 Inhaler 12  . diltiazem (CARDIZEM) 30 MG tablet Take 1 tablet by mouth as needed every four to six hours for sustained heart rates greater than 100 bpm.    . hydrochlorothiazide (HYDRODIURIL) 25 MG tablet Take 25 mg by mouth daily as needed (fluid/hypertension).     No facility-administered medications prior to visit.    Allergies   Allergen Reactions  . Amoxicillin Other ([place] Comments)    Rash  . Nitrous Oxide Other ([place] Comments)    Paradoxical response-got very hyper  . Penicillins Other ([place] Comments)    Rash  . Sodium Chloride Swelling    And increased BP    Review of Systems  Constitutional: Positive for malaise/fatigue. Negative for fever.  HENT: Negative for congestion.  Eyes: Negative for blurred vision.  Respiratory: Negative for shortness of breath.   Cardiovascular: Negative for chest pain, palpitations and leg swelling.  Gastrointestinal: Negative for abdominal pain, blood in stool and nausea.  Genitourinary: Negative for dysuria and frequency.  Musculoskeletal: Negative for falls.  Skin: Negative for rash.  Neurological: Negative for dizziness, loss of consciousness and headaches.  Endo/Heme/Allergies: Negative for environmental allergies.  Psychiatric/Behavioral: Negative for depression. The patient is not nervous/anxious.        Objective:    Physical Exam Constitutional:      Appearance: Normal appearance. She is not ill-appearing.  HENT:     Head: Normocephalic and atraumatic.     Right Ear: External ear normal.     Left Ear: External ear normal.  Eyes:     General:        Right eye: No discharge.        Left eye: No discharge.  Pulmonary:     Effort: Pulmonary effort is normal.  Neurological:     Mental Status: She is alert and oriented to person, place, and time.  Psychiatric:        Behavior: Behavior normal.     BP 120/65   Pulse 60   Wt 132 lb (59.9 kg)   BMI 24.94 kg/m  Wt Readings from Last 3 Encounters:  06/04/19 132 lb (59.9 kg)  05/24/19 132 lb (59.9 kg)  02/28/19 131 lb (59.4 kg)    Diabetic Foot Exam - Simple   No data filed     Lab Results  Component Value Date   WBC 4.9 03/01/2019   HGB 12.0 03/01/2019   HCT 36.5 03/01/2019   PLT 309.0 03/01/2019   GLUCOSE 101 (H) 03/01/2019   CHOL 204 (H) 03/01/2019   TRIG 60.0 03/01/2019   HDL 66.70  03/01/2019   LDLCALC 126 (H) 03/01/2019   ALT 7 03/01/2019   AST 17 03/01/2019   NA 138 03/01/2019   K 4.1 03/01/2019   CL 101 03/01/2019   CREATININE 0.62 03/01/2019   BUN 6 03/01/2019   CO2 29 03/01/2019   TSH 2.92 03/01/2019    Lab Results  Component Value Date   TSH 2.92 03/01/2019   Lab Results  Component Value Date   WBC 4.9 03/01/2019   HGB 12.0 03/01/2019   HCT 36.5 03/01/2019   MCV 92.5 03/01/2019   PLT 309.0 03/01/2019   Lab Results  Component Value Date   NA 138 03/01/2019   K 4.1 03/01/2019   CO2 29 03/01/2019   GLUCOSE 101 (H) 03/01/2019   BUN 6 03/01/2019   CREATININE 0.62 03/01/2019   BILITOT 0.4 03/01/2019   ALKPHOS 42 03/01/2019   AST 17 03/01/2019   ALT 7 03/01/2019   PROT 7.9 03/01/2019   ALBUMIN 4.6 03/01/2019   CALCIUM 9.2 03/01/2019   ANIONGAP 10 09/08/2015   GFR 103.98 03/01/2019   Lab Results  Component Value Date   CHOL 204 (H) 03/01/2019   Lab Results  Component Value Date   HDL 66.70 03/01/2019   Lab Results  Component Value Date   LDLCALC 126 (H) 03/01/2019   Lab Results  Component Value Date   TRIG 60.0 03/01/2019   Lab Results  Component Value Date   CHOLHDL 3 03/01/2019   No results found for: HGBA1C     Assessment & Plan:   Problem List Items Addressed This Visit    Thyroid disease    On Levothyroxine, continue to monitor  Hyperlipidemia, mixed    Encouraged heart healthy diet, increase exercise, avoid trans fats, consider a krill oil cap daily      Relevant Medications   diltiazem (CARDIZEM) 30 MG tablet   hydrochlorothiazide (MICROZIDE) 12.5 MG capsule   SVT (supraventricular tachycardia) ([facility])    Rate controlled recently      Relevant Medications   diltiazem (CARDIZEM) 30 MG tablet   hydrochlorothiazide (MICROZIDE) 12.5 MG capsule   Hypertension    Increase Diltiazem to 30 mg to bid and drop the HCTZ to 12.5 mg daily as she did not tolerate the 25 mg dose. No change to Atenolol.       Relevant Medications   diltiazem (CARDIZEM) 30 MG tablet   hydrochlorothiazide (MICROZIDE) 12.5 MG capsule      I have discontinued Nile H. Gettel's hydrochlorothiazide. I have also changed her diltiazem. Additionally, I am having her start on hydrochlorothiazide. Lastly, I am having her maintain her cyclobenzaprine, aspirin, omeprazole, triamcinolone cream, meloxicam, nitroGLYCERIN, levothyroxine, budesonide, Xopenex HFA, diclofenac sodium, methocarbamol, ACETAMINOPHEN-BUTALBITAL, ALPRAZolam, ketoconazole, and atenolol.  Meds ordered this encounter  Medications  . diltiazem (CARDIZEM) 30 MG tablet    Sig: Take 1 tablet (30 mg total) by mouth 2 (two) times daily. Take 1 tablet by mouth as needed every four to six hours for sustained heart rates greater than 100 bpm.    Dispense:  60 tablet    Refill:  2  . hydrochlorothiazide (MICROZIDE) 12.5 MG capsule    Sig: Take 1 capsule (12.5 mg total) by mouth daily.    Dispense:  30 capsule    Refill:  3     I discussed the assessment and treatment plan with the patient. The patient was provided an opportunity to ask questions and all were answered. The patient agreed with the plan and demonstrated an understanding of the instructions.   The patient was advised to call back or seek an in-person evaluation if the symptoms worsen or if the condition fails to improve as anticipated.  I provided 15 minutes of non-face-to-face time during this encounter.   Penni Homans, MD

## 2019-06-07 DIAGNOSIS — M545 Low back pain: Secondary | ICD-10-CM | POA: Diagnosis not present

## 2019-06-07 DIAGNOSIS — R2689 Other abnormalities of gait and mobility: Secondary | ICD-10-CM | POA: Diagnosis not present

## 2019-06-07 DIAGNOSIS — M542 Cervicalgia: Secondary | ICD-10-CM | POA: Diagnosis not present

## 2019-06-07 DIAGNOSIS — R531 Weakness: Secondary | ICD-10-CM | POA: Diagnosis not present

## 2019-06-10 DIAGNOSIS — R2689 Other abnormalities of gait and mobility: Secondary | ICD-10-CM | POA: Diagnosis not present

## 2019-06-10 DIAGNOSIS — M542 Cervicalgia: Secondary | ICD-10-CM | POA: Diagnosis not present

## 2019-06-10 DIAGNOSIS — M545 Low back pain: Secondary | ICD-10-CM | POA: Diagnosis not present

## 2019-06-10 DIAGNOSIS — R531 Weakness: Secondary | ICD-10-CM | POA: Diagnosis not present

## 2019-06-12 DIAGNOSIS — M545 Low back pain: Secondary | ICD-10-CM | POA: Diagnosis not present

## 2019-06-12 DIAGNOSIS — R2689 Other abnormalities of gait and mobility: Secondary | ICD-10-CM | POA: Diagnosis not present

## 2019-06-12 DIAGNOSIS — R531 Weakness: Secondary | ICD-10-CM | POA: Diagnosis not present

## 2019-06-12 DIAGNOSIS — M542 Cervicalgia: Secondary | ICD-10-CM | POA: Diagnosis not present

## 2019-06-14 DIAGNOSIS — M545 Low back pain: Secondary | ICD-10-CM | POA: Diagnosis not present

## 2019-06-14 DIAGNOSIS — M542 Cervicalgia: Secondary | ICD-10-CM | POA: Diagnosis not present

## 2019-06-14 DIAGNOSIS — R531 Weakness: Secondary | ICD-10-CM | POA: Diagnosis not present

## 2019-06-14 DIAGNOSIS — R2689 Other abnormalities of gait and mobility: Secondary | ICD-10-CM | POA: Diagnosis not present

## 2019-06-17 DIAGNOSIS — R2689 Other abnormalities of gait and mobility: Secondary | ICD-10-CM | POA: Diagnosis not present

## 2019-06-17 DIAGNOSIS — R531 Weakness: Secondary | ICD-10-CM | POA: Diagnosis not present

## 2019-06-17 DIAGNOSIS — M542 Cervicalgia: Secondary | ICD-10-CM | POA: Diagnosis not present

## 2019-06-17 DIAGNOSIS — M545 Low back pain: Secondary | ICD-10-CM | POA: Diagnosis not present

## 2019-06-18 ENCOUNTER — Other Ambulatory Visit: Payer: Self-pay | Admitting: Family Medicine

## 2019-06-19 DIAGNOSIS — M545 Low back pain: Secondary | ICD-10-CM | POA: Diagnosis not present

## 2019-06-19 DIAGNOSIS — M542 Cervicalgia: Secondary | ICD-10-CM | POA: Diagnosis not present

## 2019-06-19 DIAGNOSIS — R531 Weakness: Secondary | ICD-10-CM | POA: Diagnosis not present

## 2019-06-19 DIAGNOSIS — R2689 Other abnormalities of gait and mobility: Secondary | ICD-10-CM | POA: Diagnosis not present

## 2019-06-21 DIAGNOSIS — M545 Low back pain: Secondary | ICD-10-CM | POA: Diagnosis not present

## 2019-06-21 DIAGNOSIS — M542 Cervicalgia: Secondary | ICD-10-CM | POA: Diagnosis not present

## 2019-06-21 DIAGNOSIS — R2689 Other abnormalities of gait and mobility: Secondary | ICD-10-CM | POA: Diagnosis not present

## 2019-06-21 DIAGNOSIS — R531 Weakness: Secondary | ICD-10-CM | POA: Diagnosis not present

## 2019-06-24 DIAGNOSIS — M545 Low back pain: Secondary | ICD-10-CM | POA: Diagnosis not present

## 2019-06-24 DIAGNOSIS — R531 Weakness: Secondary | ICD-10-CM | POA: Diagnosis not present

## 2019-06-24 DIAGNOSIS — R2689 Other abnormalities of gait and mobility: Secondary | ICD-10-CM | POA: Diagnosis not present

## 2019-06-24 DIAGNOSIS — M542 Cervicalgia: Secondary | ICD-10-CM | POA: Diagnosis not present

## 2019-06-28 DIAGNOSIS — M542 Cervicalgia: Secondary | ICD-10-CM | POA: Diagnosis not present

## 2019-06-28 DIAGNOSIS — H10413 Chronic giant papillary conjunctivitis, bilateral: Secondary | ICD-10-CM | POA: Diagnosis not present

## 2019-06-28 DIAGNOSIS — H0100A Unspecified blepharitis right eye, upper and lower eyelids: Secondary | ICD-10-CM | POA: Diagnosis not present

## 2019-06-28 DIAGNOSIS — R2689 Other abnormalities of gait and mobility: Secondary | ICD-10-CM | POA: Diagnosis not present

## 2019-06-28 DIAGNOSIS — H5203 Hypermetropia, bilateral: Secondary | ICD-10-CM | POA: Diagnosis not present

## 2019-06-28 DIAGNOSIS — M545 Low back pain: Secondary | ICD-10-CM | POA: Diagnosis not present

## 2019-06-28 DIAGNOSIS — R531 Weakness: Secondary | ICD-10-CM | POA: Diagnosis not present

## 2019-07-01 DIAGNOSIS — M545 Low back pain: Secondary | ICD-10-CM | POA: Diagnosis not present

## 2019-07-01 DIAGNOSIS — M542 Cervicalgia: Secondary | ICD-10-CM | POA: Diagnosis not present

## 2019-07-01 DIAGNOSIS — R531 Weakness: Secondary | ICD-10-CM | POA: Diagnosis not present

## 2019-07-01 DIAGNOSIS — R2689 Other abnormalities of gait and mobility: Secondary | ICD-10-CM | POA: Diagnosis not present

## 2019-07-05 ENCOUNTER — Encounter: Payer: Self-pay | Admitting: Family Medicine

## 2019-07-05 ENCOUNTER — Ambulatory Visit (INDEPENDENT_AMBULATORY_CARE_PROVIDER_SITE_OTHER): Payer: BC Managed Care – PPO | Admitting: Family Medicine

## 2019-07-05 ENCOUNTER — Telehealth (INDEPENDENT_AMBULATORY_CARE_PROVIDER_SITE_OTHER): Payer: BC Managed Care – PPO | Admitting: Family Medicine

## 2019-07-05 ENCOUNTER — Other Ambulatory Visit: Payer: Self-pay

## 2019-07-05 VITALS — BP 110/78 | Ht 61.0 in

## 2019-07-05 VITALS — BP 110/74 | HR 72 | Temp 97.5°F | Wt 129.0 lb

## 2019-07-05 DIAGNOSIS — M542 Cervicalgia: Secondary | ICD-10-CM | POA: Diagnosis not present

## 2019-07-05 DIAGNOSIS — R42 Dizziness and giddiness: Secondary | ICD-10-CM

## 2019-07-05 DIAGNOSIS — M545 Low back pain, unspecified: Secondary | ICD-10-CM

## 2019-07-05 DIAGNOSIS — R2689 Other abnormalities of gait and mobility: Secondary | ICD-10-CM | POA: Diagnosis not present

## 2019-07-05 DIAGNOSIS — E079 Disorder of thyroid, unspecified: Secondary | ICD-10-CM

## 2019-07-05 DIAGNOSIS — R531 Weakness: Secondary | ICD-10-CM | POA: Diagnosis not present

## 2019-07-05 DIAGNOSIS — I10 Essential (primary) hypertension: Secondary | ICD-10-CM | POA: Diagnosis not present

## 2019-07-05 DIAGNOSIS — G44209 Tension-type headache, unspecified, not intractable: Secondary | ICD-10-CM

## 2019-07-05 DIAGNOSIS — E782 Mixed hyperlipidemia: Secondary | ICD-10-CM

## 2019-07-05 MED ORDER — LEVOTHYROXINE SODIUM 25 MCG PO TABS: 25.0000 ug | ORAL_TABLET | Freq: Every day | ORAL | 1 refills | Status: DC

## 2019-07-05 MED ORDER — METHOCARBAMOL 500 MG PO TABS: 500.0000 mg | ORAL_TABLET | Freq: Three times a day (TID) | ORAL | 2 refills | Status: DC | PRN

## 2019-07-05 NOTE — Patient Instructions (Signed)
Finish out physical therapy and do home exercises on days you don't go to therapy. You will need to do home exercises/stretches twice a week indefinitely to minimize the risk this comes back. I sent the robaxin in with 2 additional refills - take as needed. Call me if you need anything or have questions otherwise follow up as needed.
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
## 2019-07-07 DIAGNOSIS — R42 Dizziness and giddiness: Secondary | ICD-10-CM | POA: Insufficient documentation

## 2019-07-07 NOTE — Assessment & Plan Note (Signed)
Improved with improved hydration.

## 2019-07-07 NOTE — Assessment & Plan Note (Signed)
Encouraged increased hydration, 64 ounces of clear fluids daily. Minimize alcohol and caffeine. Eat small frequent meals with lean proteins and complex carbs. Avoid high and low blood sugars. Get adequate sleep, 7-8 hours a night. Needs exercise daily preferably in the morning.

## 2019-07-07 NOTE — Assessment & Plan Note (Signed)
Well controlled, no changes to meds. Encouraged heart healthy diet such as the DASH diet and exercise as tolerated. She reports her numbers have improved.

## 2019-07-07 NOTE — Assessment & Plan Note (Signed)
On Levothyroxine, continue to monitor 

## 2019-07-07 NOTE — Progress Notes (Signed)
Virtual Visit via phone Note  I connected with April Jackson on 07/05/19 at 10:00 AM EDT by a phone enabled telemedicine application and verified that I am speaking with the correct person using two identifiers.  Location: Patient: home, patient is in visit Provider: home, provider is in visit   I discussed the limitations of evaluation and management by telemedicine and the availability of in person appointments. The patient expressed understanding and agreed to proceed. April Jackson, CMA was able to get the patient set up on a phone visit after being unable to set up a video visit   Subjective:    Patient ID: April Jackson, female    DOB: November 02, 1975, 44 y.o.   MRN: [id number]  Chief Complaint  Patient presents with  . Thyroid Disease    4-6 weeks  . Dizziness    HPI Patient is in today for follow up on chronic medical concerns. She feels much better than at her last visit. She started drinking significantly more water and resting and her blood pressure has improved and she is no longer getting dizzy readily. Denies CP/palp/SOB/HA/congestion/fevers/GI or GU c/o. Taking meds as prescribed  Past Medical History:  Diagnosis Date  . Allergy    childhood allergies, hives.   . Anemia 04/15/2016  . Anxiety and depression 09/21/2018  . Back pain 09/28/2014  . Back pain affecting pregnancy, antepartum 09/28/2014  . Cervical cancer screening 01/27/2015   Menarche at 11 Regular and moderate flow No history of abnormal pap in past No sexual activity G0P0, s/p Nohistory of abnormal MGM No concerns today No gyn surgeries  . FH: breast cancer 09/28/2014  . Hyperlipidemia, mixed 09/28/2014  . Hypertension   . Pruritus 09/28/2014  . Reactive airway disease 04/15/2016  . Seasonal allergies   . SVT (supraventricular tachycardia) ([place])   . Thyroid disease     No past surgical history on file.  Family History  Problem Relation Age of Onset  . Heart disease Father   . Pulmonary fibrosis Father    pneumonia  . Cancer Sister 20       cervical cancer  . Hypertension Sister   . Hyperlipidemia Sister   . Gout Sister   . Diabetes Mother   . Hypertension Mother   . Hyperlipidemia Mother   . Cancer Mother        cervical cancer  . Diabetes Maternal Grandmother   . Stroke Paternal Grandmother   . Heart disease Paternal Grandfather        MI  . Hypertension Sister   . Cholelithiasis Sister   . Thyroid disease Sister   . Ovarian cysts Sister   . Cancer Sister        breast  . Asthma Sister     Social History   Socioeconomic History  . Marital status: Single    Spouse name: Not on file  . Number of children: 0  . Years of education: Not on file  . Highest education level: Not on file  Occupational History  . Occupation: DENTIST    Employer: SELF EMPLOYED  Tobacco Use  . Smoking status: Never Smoker  . Smokeless tobacco: Never Used  Substance and Sexual Activity  . Alcohol use: No  . Drug use: No  . 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 Not on file  Social History Narrative  . Not on file   Social Determinants of  Health   Financial Resource Strain:   . Difficulty of Paying Living Expenses:   Food Insecurity:   . Worried About Charity fundraiser in the Last Year:   . Arboriculturist in the Last Year:   Transportation Needs:   . Film/video editor (Medical):   Marland Kitchen Lack of Transportation (Non-Medical):   Physical Activity:   . Days of Exercise per Week:   . Minutes of Exercise per Session:   Stress:   . Feeling of Stress :   Social Connections:   . Frequency of Communication with Friends and Family:   . Frequency of Social Gatherings with Friends and Family:   . Attends Religious Services:   . Active Member of Clubs or Organizations:   . Attends Archivist Meetings:   Marland Kitchen Marital Status:   Intimate Partner Violence:   . Fear of Current or Ex-Partner:   .  Emotionally Abused:   Marland Kitchen Physically Abused:   . Sexually Abused:     Outpatient Medications Prior to Visit  Medication Sig Dispense Refill  . ACETAMINOPHEN-BUTALBITAL 50-325 MG TABS Take 1 tablet by mouth 3 (three) times daily as needed. 120 tablet 0  . ALPRAZolam (XANAX) 0.25 MG tablet Take 1 tablet (0.25 mg total) by mouth 2 (two) times daily as needed for anxiety. 20 tablet 2  . aspirin 325 MG tablet Take 325 mg by mouth daily.    Marland Kitchen atenolol (TENORMIN) 25 MG tablet Take 1 tablet (25 mg total) by mouth daily. 90 tablet 3  . budesonide (RHINOCORT AQUA) 32 MCG/ACT nasal spray Place 2 sprays into both nostrils daily. 8.6 g 2  . cyclobenzaprine (FLEXERIL) 10 MG tablet Take 10 mg by mouth 3 (three) times daily as needed for muscle spasms.    . diclofenac sodium (VOLTAREN) 1 % GEL Apply 4 g topically 4 (four) times daily. 3 Tube 1  . diltiazem (CARDIZEM) 30 MG tablet TAKE 1 TABLET (30 MG TOTAL) BY MOUTH 2 (TWO) TIMES DAILY. ADDITIONALLY, TAKE 1 TABLET BY MOUTH EVERY FOUR TO SIX HOURS AS NEEDED FOR SUSTAINED HEART RATES GREATER THAN 100 BPM. 360 tablet 1  . hydrochlorothiazide (MICROZIDE) 12.5 MG capsule Take 1 capsule (12.5 mg total) by mouth daily. 30 capsule 3  . ketoconazole (NIZORAL) 2 % shampoo Apply 1 application topically 2 (two) times a week. 120 mL 1  . meloxicam (MOBIC) 15 MG tablet Take 1 tablet (15 mg total) by mouth daily as needed for pain. 30 tablet 2  . methocarbamol (ROBAXIN) 500 MG tablet Take 1 tablet (500 mg total) by mouth every 8 (eight) hours as needed. 90 tablet 2  . nitroGLYCERIN (NITRODUR - DOSED IN MG/24 HR) 0.2 mg/hr patch Apply 1/4th patch to affected shoulder, change daily 30 patch 1  . omeprazole (PRILOSEC) 20 MG capsule Take 20 mg by mouth daily.    Marland Kitchen triamcinolone cream (KENALOG) 0.1 % Apply topically 2 (two) times daily. Apply topically 30 g 3  . XOPENEX HFA 45 MCG/ACT inhaler Inhale 1-2 puffs into the lungs every 6 (six) hours as needed for wheezing. 1 Inhaler 12    . levothyroxine (SYNTHROID) 25 MCG tablet Take 1 tablet (25 mcg total) by mouth daily before breakfast. 90 tablet 0   No facility-administered medications prior to visit.    Allergies  Allergen Reactions  . Amoxicillin Other ([place] Comments)    Rash  . Nitrous Oxide Other ([place] Comments)    Paradoxical response-got very hyper  . Penicillins Other (  [place] Comments)    Rash  . Sodium Chloride Swelling    And increased BP    Review of Systems  Constitutional: Negative for fever and malaise/fatigue.  HENT: Negative for congestion.   Eyes: Negative for blurred vision.  Respiratory: Negative for shortness of breath.   Cardiovascular: Negative for chest pain, palpitations and leg swelling.  Gastrointestinal: Negative for abdominal pain, blood in stool and nausea.  Genitourinary: Negative for dysuria and frequency.  Musculoskeletal: Negative for falls.  Skin: Negative for rash.  Neurological: Negative for dizziness, loss of consciousness and headaches.  Endo/Heme/Allergies: Negative for environmental allergies.  Psychiatric/Behavioral: Negative for depression. The patient is not nervous/anxious.        Objective:    Physical Exam unable to obtain via phone visit  BP 110/74   Pulse 72   Temp (!) 97.5 F (36.4 C) (Temporal)   Wt 129 lb (58.5 kg)   BMI 24.37 kg/m  Wt Readings from Last 3 Encounters:  07/05/19 129 lb (58.5 kg)  06/04/19 132 lb (59.9 kg)  05/24/19 132 lb (59.9 kg)    Diabetic Foot Exam - Simple   No data filed     Lab Results  Component Value Date   WBC 4.9 03/01/2019   HGB 12.0 03/01/2019   HCT 36.5 03/01/2019   PLT 309.0 03/01/2019   GLUCOSE 101 (H) 03/01/2019   CHOL 204 (H) 03/01/2019   TRIG 60.0 03/01/2019   HDL 66.70 03/01/2019   LDLCALC 126 (H) 03/01/2019   ALT 7 03/01/2019   AST 17 03/01/2019   NA 138 03/01/2019   K 4.1 03/01/2019   CL 101 03/01/2019   CREATININE 0.62 03/01/2019   BUN 6 03/01/2019   CO2 29 03/01/2019   TSH 2.92  03/01/2019    Lab Results  Component Value Date   TSH 2.92 03/01/2019   Lab Results  Component Value Date   WBC 4.9 03/01/2019   HGB 12.0 03/01/2019   HCT 36.5 03/01/2019   MCV 92.5 03/01/2019   PLT 309.0 03/01/2019   Lab Results  Component Value Date   NA 138 03/01/2019   K 4.1 03/01/2019   CO2 29 03/01/2019   GLUCOSE 101 (H) 03/01/2019   BUN 6 03/01/2019   CREATININE 0.62 03/01/2019   BILITOT 0.4 03/01/2019   ALKPHOS 42 03/01/2019   AST 17 03/01/2019   ALT 7 03/01/2019   PROT 7.9 03/01/2019   ALBUMIN 4.6 03/01/2019   CALCIUM 9.2 03/01/2019   ANIONGAP 10 09/08/2015   GFR 103.98 03/01/2019   Lab Results  Component Value Date   CHOL 204 (H) 03/01/2019   Lab Results  Component Value Date   HDL 66.70 03/01/2019   Lab Results  Component Value Date   LDLCALC 126 (H) 03/01/2019   Lab Results  Component Value Date   TRIG 60.0 03/01/2019   Lab Results  Component Value Date   CHOLHDL 3 03/01/2019   No results found for: HGBA1C     Assessment & Plan:   Problem List Items Addressed This Visit    Thyroid disease - Primary    On Levothyroxine, continue to monitor      Relevant Medications   levothyroxine (SYNTHROID) 25 MCG tablet   Other Relevant Orders   T4, free   TSH   Hyperlipidemia, mixed   Relevant Orders   Lipid panel   Hypertension    Well controlled, no changes to meds. Encouraged heart healthy diet such as the DASH diet and exercise as  tolerated. She reports her numbers have improved.       Relevant Orders   CBC   Comprehensive metabolic panel   Tension headache    Encouraged increased hydration, 64 ounces of clear fluids daily. Minimize alcohol and caffeine. Eat small frequent meals with lean proteins and complex carbs. Avoid high and low blood sugars. Get adequate sleep, 7-8 hours a night. Needs exercise daily preferably in the morning.      Dizziness    Improved with improved hydration.          I am having April Jackson maintain  her cyclobenzaprine, aspirin, omeprazole, triamcinolone cream, meloxicam, nitroGLYCERIN, budesonide, Xopenex HFA, diclofenac sodium, ACETAMINOPHEN-BUTALBITAL, ALPRAZolam, ketoconazole, atenolol, hydrochlorothiazide, diltiazem, methocarbamol, and levothyroxine.  Meds ordered this encounter  Medications  . levothyroxine (SYNTHROID) 25 MCG tablet    Sig: Take 1 tablet (25 mcg total) by mouth daily before breakfast.    Dispense:  90 tablet    Refill:  1    I discussed the assessment and treatment plan with the patient. The patient was provided an opportunity to ask questions and all were answered. The patient agreed with the plan and demonstrated an understanding of the instructions.   The patient was advised to call back or seek an in-person evaluation if the symptoms worsen or if the condition fails to improve as anticipated.  I provided 20 minutes of non-face-to-face time during this encounter.   Penni Homans, MD

## 2019-07-08 DIAGNOSIS — M542 Cervicalgia: Secondary | ICD-10-CM | POA: Diagnosis not present

## 2019-07-08 DIAGNOSIS — M545 Low back pain: Secondary | ICD-10-CM | POA: Diagnosis not present

## 2019-07-08 DIAGNOSIS — R531 Weakness: Secondary | ICD-10-CM | POA: Diagnosis not present

## 2019-07-08 DIAGNOSIS — R2689 Other abnormalities of gait and mobility: Secondary | ICD-10-CM | POA: Diagnosis not present

## 2019-07-12 DIAGNOSIS — R531 Weakness: Secondary | ICD-10-CM | POA: Diagnosis not present

## 2019-07-12 DIAGNOSIS — R2689 Other abnormalities of gait and mobility: Secondary | ICD-10-CM | POA: Diagnosis not present

## 2019-07-12 DIAGNOSIS — M545 Low back pain: Secondary | ICD-10-CM | POA: Diagnosis not present

## 2019-07-12 DIAGNOSIS — M542 Cervicalgia: Secondary | ICD-10-CM | POA: Diagnosis not present

## 2019-07-16 DIAGNOSIS — M542 Cervicalgia: Secondary | ICD-10-CM | POA: Diagnosis not present

## 2019-07-16 DIAGNOSIS — R531 Weakness: Secondary | ICD-10-CM | POA: Diagnosis not present

## 2019-07-16 DIAGNOSIS — M545 Low back pain: Secondary | ICD-10-CM | POA: Diagnosis not present

## 2019-07-16 DIAGNOSIS — R2689 Other abnormalities of gait and mobility: Secondary | ICD-10-CM | POA: Diagnosis not present

## 2019-07-19 DIAGNOSIS — M542 Cervicalgia: Secondary | ICD-10-CM | POA: Diagnosis not present

## 2019-07-19 DIAGNOSIS — R531 Weakness: Secondary | ICD-10-CM | POA: Diagnosis not present

## 2019-07-19 DIAGNOSIS — M545 Low back pain: Secondary | ICD-10-CM | POA: Diagnosis not present

## 2019-07-19 DIAGNOSIS — R2689 Other abnormalities of gait and mobility: Secondary | ICD-10-CM | POA: Diagnosis not present

## 2019-07-22 DIAGNOSIS — R531 Weakness: Secondary | ICD-10-CM | POA: Diagnosis not present

## 2019-07-22 DIAGNOSIS — M545 Low back pain: Secondary | ICD-10-CM | POA: Diagnosis not present

## 2019-07-22 DIAGNOSIS — R2689 Other abnormalities of gait and mobility: Secondary | ICD-10-CM | POA: Diagnosis not present

## 2019-07-22 DIAGNOSIS — M542 Cervicalgia: Secondary | ICD-10-CM | POA: Diagnosis not present

## 2019-07-26 DIAGNOSIS — M545 Low back pain: Secondary | ICD-10-CM | POA: Diagnosis not present

## 2019-07-26 DIAGNOSIS — M542 Cervicalgia: Secondary | ICD-10-CM | POA: Diagnosis not present

## 2019-07-26 DIAGNOSIS — R2689 Other abnormalities of gait and mobility: Secondary | ICD-10-CM | POA: Diagnosis not present

## 2019-07-26 DIAGNOSIS — R531 Weakness: Secondary | ICD-10-CM | POA: Diagnosis not present

## 2019-08-01 DIAGNOSIS — M545 Low back pain: Secondary | ICD-10-CM | POA: Diagnosis not present

## 2019-08-01 DIAGNOSIS — M542 Cervicalgia: Secondary | ICD-10-CM | POA: Diagnosis not present

## 2019-08-30 ENCOUNTER — Ambulatory Visit: Payer: BC Managed Care – PPO | Admitting: Family Medicine

## 2019-08-31 DIAGNOSIS — M545 Low back pain: Secondary | ICD-10-CM | POA: Diagnosis not present

## 2019-08-31 DIAGNOSIS — M542 Cervicalgia: Secondary | ICD-10-CM | POA: Diagnosis not present

## 2019-10-01 DIAGNOSIS — M545 Low back pain: Secondary | ICD-10-CM | POA: Diagnosis not present

## 2019-10-01 DIAGNOSIS — M542 Cervicalgia: Secondary | ICD-10-CM | POA: Diagnosis not present

## 2019-10-11 ENCOUNTER — Other Ambulatory Visit: Payer: Self-pay

## 2019-10-11 ENCOUNTER — Ambulatory Visit (INDEPENDENT_AMBULATORY_CARE_PROVIDER_SITE_OTHER): Payer: BC Managed Care – PPO

## 2019-10-11 DIAGNOSIS — Z23 Encounter for immunization: Secondary | ICD-10-CM | POA: Diagnosis not present

## 2019-10-24 DIAGNOSIS — E782 Mixed hyperlipidemia: Secondary | ICD-10-CM | POA: Diagnosis not present

## 2019-10-24 DIAGNOSIS — Z9889 Other specified postprocedural states: Secondary | ICD-10-CM | POA: Diagnosis not present

## 2019-10-24 DIAGNOSIS — Z23 Encounter for immunization: Secondary | ICD-10-CM | POA: Diagnosis not present

## 2019-10-24 DIAGNOSIS — G4733 Obstructive sleep apnea (adult) (pediatric): Secondary | ICD-10-CM | POA: Diagnosis not present

## 2019-11-01 DIAGNOSIS — M545 Low back pain: Secondary | ICD-10-CM | POA: Diagnosis not present

## 2019-11-01 DIAGNOSIS — M542 Cervicalgia: Secondary | ICD-10-CM | POA: Diagnosis not present

## 2019-11-13 ENCOUNTER — Encounter: Payer: Self-pay | Admitting: Family Medicine

## 2019-11-14 ENCOUNTER — Other Ambulatory Visit: Payer: Self-pay | Admitting: Family Medicine

## 2019-11-14 MED ORDER — ALPRAZOLAM 0.25 MG PO TABS: 0.2500 mg | ORAL_TABLET | Freq: Two times a day (BID) | ORAL | 1 refills | Status: DC | PRN

## 2019-11-14 NOTE — Telephone Encounter (Signed)
Patient Request Rx Refill Request (Xanax)  Last written:09/21/18 Last ov:07/05/19 Next ov:12/05/19 Contract: None UDS: None  Made a note to obtain UDS at next OV and get contract.

## 2019-12-01 DIAGNOSIS — M542 Cervicalgia: Secondary | ICD-10-CM | POA: Diagnosis not present

## 2019-12-01 DIAGNOSIS — M5459 Other low back pain: Secondary | ICD-10-CM | POA: Diagnosis not present

## 2019-12-05 ENCOUNTER — Ambulatory Visit: Payer: BC Managed Care – PPO | Admitting: Family Medicine

## 2019-12-05 ENCOUNTER — Other Ambulatory Visit: Payer: Self-pay

## 2019-12-05 VITALS — BP 114/66 | HR 82 | Temp 98.2°F | Resp 16 | Ht 61.0 in | Wt 131.8 lb

## 2019-12-05 DIAGNOSIS — I10 Essential (primary) hypertension: Secondary | ICD-10-CM

## 2019-12-05 DIAGNOSIS — Z0000 Encounter for general adult medical examination without abnormal findings: Secondary | ICD-10-CM | POA: Diagnosis not present

## 2019-12-05 DIAGNOSIS — E079 Disorder of thyroid, unspecified: Secondary | ICD-10-CM | POA: Diagnosis not present

## 2019-12-05 DIAGNOSIS — E782 Mixed hyperlipidemia: Secondary | ICD-10-CM | POA: Diagnosis not present

## 2019-12-05 DIAGNOSIS — R7989 Other specified abnormal findings of blood chemistry: Secondary | ICD-10-CM | POA: Diagnosis not present

## 2019-12-05 DIAGNOSIS — Z1231 Encounter for screening mammogram for malignant neoplasm of breast: Secondary | ICD-10-CM | POA: Diagnosis not present

## 2019-12-05 DIAGNOSIS — J4520 Mild intermittent asthma, uncomplicated: Secondary | ICD-10-CM

## 2019-12-05 DIAGNOSIS — R946 Abnormal results of thyroid function studies: Secondary | ICD-10-CM | POA: Diagnosis not present

## 2019-12-05 DIAGNOSIS — F32A Depression, unspecified: Secondary | ICD-10-CM

## 2019-12-05 DIAGNOSIS — F419 Anxiety disorder, unspecified: Secondary | ICD-10-CM

## 2019-12-05 NOTE — Assessment & Plan Note (Signed)
Well controlled, no changes to meds. Encouraged heart healthy diet such as the DASH diet and exercise as tolerated.  °

## 2019-12-05 NOTE — Patient Instructions (Signed)

## 2019-12-07 LAB — CBC
HCT: 36.5 % (ref 35.0–45.0)
Hemoglobin: 11.9 g/dL (ref 11.7–15.5)
MCH: 30.5 pg (ref 27.0–33.0)
MCHC: 32.6 g/dL (ref 32.0–36.0)
MCV: 93.6 fL (ref 80.0–100.0)
MPV: 9.4 fL (ref 7.5–12.5)
Platelets: 291 10*3/uL (ref 140–400)
RBC: 3.9 10*6/uL (ref 3.80–5.10)
RDW: 11.7 % (ref 11.0–15.0)
WBC: 4.4 10*3/uL (ref 3.8–10.8)

## 2019-12-07 LAB — TSH: TSH: 4.85 mIU/L — ABNORMAL HIGH

## 2019-12-07 LAB — COMPREHENSIVE METABOLIC PANEL
AG Ratio: 1.4 (calc) (ref 1.0–2.5)
ALT: 12 U/L (ref 6–29)
AST: 18 U/L (ref 10–35)
Albumin: 4.2 g/dL (ref 3.6–5.1)
Alkaline phosphatase (APISO): 45 U/L (ref 31–125)
BUN/Creatinine Ratio: 8 (calc) (ref 6–22)
BUN: 6 mg/dL — ABNORMAL LOW (ref 7–25)
CO2: 29 mmol/L (ref 20–32)
Calcium: 9.2 mg/dL (ref 8.6–10.2)
Chloride: 102 mmol/L (ref 98–110)
Creat: 0.74 mg/dL (ref 0.50–1.10)
Globulin: 3.1 g/dL (calc) (ref 1.9–3.7)
Glucose, Bld: 88 mg/dL (ref 65–99)
Potassium: 4.3 mmol/L (ref 3.5–5.3)
Sodium: 140 mmol/L (ref 135–146)
Total Bilirubin: 0.4 mg/dL (ref 0.2–1.2)
Total Protein: 7.3 g/dL (ref 6.1–8.1)

## 2019-12-07 LAB — LIPID PANEL
Cholesterol: 201 mg/dL — ABNORMAL HIGH (ref <200)
HDL: 67 mg/dL (ref >=50)
LDL Cholesterol (Calc): 108 mg/dL (calc) — ABNORMAL HIGH
Non-HDL Cholesterol (Calc): 134 mg/dL (calc) — ABNORMAL HIGH (ref <130)
Total CHOL/HDL Ratio: 3 (calc) (ref <5.0)
Triglycerides: 149 mg/dL (ref <150)

## 2019-12-07 LAB — T4, FREE: Free T4: 1.2 ng/dL (ref 0.8–1.8)

## 2019-12-07 LAB — TEST AUTHORIZATION

## 2019-12-08 NOTE — Assessment & Plan Note (Signed)
No recent exacerbation. No change in medications.

## 2019-12-08 NOTE — Assessment & Plan Note (Signed)
Doing well on current meds. Tolerates Alprazolam and uses it infrequently, may continue

## 2019-12-08 NOTE — Assessment & Plan Note (Signed)
On Levothyroxine, continue to monitor 

## 2019-12-08 NOTE — Assessment & Plan Note (Signed)
Encouraged heart healthy diet, increase exercise, avoid trans fats, consider a krill oil cap daily 

## 2019-12-08 NOTE — Assessment & Plan Note (Addendum)
Patient encouraged to maintain heart healthy diet, regular exercise, adequate sleep. Consider daily probiotics. Take medications as prescribed. Labs ordered and reviewed. MGM is ordered

## 2019-12-08 NOTE — Progress Notes (Signed)
Subjective:    Patient ID: April Jackson, female    DOB: [birth year], 44 y.o.   MRN: [id number]  Chief Complaint  Patient presents with  . Annual Exam    HPI Patient is in today for annual preventative exam and follow up on chronic medical concerns. No recent febrile illness or hospitalizations. She is accompanied by her sister and they report she is doing well. No recent febrile illness or hospitalizations. She has recently started exercising more. She tried melatonin but it made her too sleepy, she may try a smaller dose. Denies CP/palp/SOB/HA/congestion/fevers/GI or GU c/o. Taking meds as prescribed  Past Medical History:  Diagnosis Date  . Allergy    childhood allergies, hives.   . Anemia 04/15/2016  . Anxiety and depression 09/21/2018  . Back pain 09/28/2014  . Back pain affecting pregnancy, antepartum 09/28/2014  . Cervical cancer screening 01/27/2015   Menarche at 11 Regular and moderate flow No history of abnormal pap in past No sexual activity G0P0, s/p Nohistory of abnormal MGM No concerns today No gyn surgeries  . FH: breast cancer 09/28/2014  . Hyperlipidemia, mixed 09/28/2014  . Hypertension   . Pruritus 09/28/2014  . Reactive airway disease 04/15/2016  . Seasonal allergies   . SVT (supraventricular tachycardia) ([place])   . Thyroid disease     No past surgical history on file.  Family History  Problem Relation Age of Onset  . Heart disease Father   . Pulmonary fibrosis Father        pneumonia  . Cancer Sister 70       cervical cancer  . Hypertension Sister   . Hyperlipidemia Sister   . Gout Sister   . Diabetes Mother   . Hypertension Mother   . Hyperlipidemia Mother   . Cancer Mother        cervical cancer  . Diabetes Maternal Grandmother   . Stroke Paternal Grandmother   . Heart disease Paternal Grandfather        MI  . Hypertension Sister   . Cholelithiasis Sister   . Thyroid disease Sister   . Ovarian cysts Sister   . Cancer Sister        breast  . Asthma  Sister     Social History   Socioeconomic History  . Marital status: Single    Spouse name: Not on file  . Number of children: 0  . Years of education: Not on file  . Highest education level: Not on file  Occupational History  . Occupation: DENTIST    Employer: SELF EMPLOYED  Tobacco Use  . Smoking status: Never Smoker  . Smokeless tobacco: Never Used  Substance and Sexual Activity  . Alcohol use: No  . Drug use: No  . 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 Not on file  Social History Narrative  . Not on file   Social Determinants of Health   Financial Resource Strain:   . Difficulty of Paying Living Expenses: Not on file  Food Insecurity:   . Worried About Charity fundraiser in the Last Year: Not on file  . Ran Out of Food in the Last Year: Not on file  Transportation Needs:   . Lack of Transportation (Medical): Not on file  . Lack of Transportation (Non-Medical): Not on file  Physical Activity:   . Days of Exercise per Week: Not on file  .  Minutes of Exercise per Session: Not on file  Stress:   . Feeling of Stress : Not on file  Social Connections:   . Frequency of Communication with Friends and Family: Not on file  . Frequency of Social Gatherings with Friends and Family: Not on file  . Attends Religious Services: Not on file  . Active Member of Clubs or Organizations: Not on file  . Attends Archivist Meetings: Not on file  . Marital Status: Not on file  Intimate Partner Violence:   . Fear of Current or Ex-Partner: Not on file  . Emotionally Abused: Not on file  . Physically Abused: Not on file  . Sexually Abused: Not on file    Outpatient Medications Prior to Visit  Medication Sig Dispense Refill  . ACETAMINOPHEN-BUTALBITAL 50-325 MG TABS Take 1 tablet by mouth 3 (three) times daily as needed. 120 tablet 0  . ALPRAZolam (XANAX) 0.25 MG tablet Take 1 tablet (0.25 mg  total) by mouth 2 (two) times daily as needed for anxiety. 20 tablet 1  . aspirin 325 MG tablet Take 325 mg by mouth daily.    Marland Kitchen atenolol (TENORMIN) 25 MG tablet Take 1 tablet (25 mg total) by mouth daily. 90 tablet 3  . budesonide (RHINOCORT AQUA) 32 MCG/ACT nasal spray Place 2 sprays into both nostrils daily. 8.6 g 2  . diclofenac sodium (VOLTAREN) 1 % GEL Apply 4 g topically 4 (four) times daily. 3 Tube 1  . diltiazem (CARDIZEM) 30 MG tablet TAKE 1 TABLET (30 MG TOTAL) BY MOUTH 2 (TWO) TIMES DAILY. ADDITIONALLY, TAKE 1 TABLET BY MOUTH EVERY FOUR TO SIX HOURS AS NEEDED FOR SUSTAINED HEART RATES GREATER THAN 100 BPM. 360 tablet 1  . hydrochlorothiazide (MICROZIDE) 12.5 MG capsule Take 1 capsule (12.5 mg total) by mouth daily. 30 capsule 3  . ketoconazole (NIZORAL) 2 % shampoo Apply 1 application topically 2 (two) times a week. 120 mL 1  . levothyroxine (SYNTHROID) 25 MCG tablet Take 1 tablet (25 mcg total) by mouth daily before breakfast. 90 tablet 1  . meloxicam (MOBIC) 15 MG tablet Take 1 tablet (15 mg total) by mouth daily as needed for pain. 30 tablet 2  . methocarbamol (ROBAXIN) 500 MG tablet Take 1 tablet (500 mg total) by mouth every 8 (eight) hours as needed. 90 tablet 2  . nitroGLYCERIN (NITRODUR - DOSED IN MG/24 HR) 0.2 mg/hr patch Apply 1/4th patch to affected shoulder, change daily 30 patch 1  . omeprazole (PRILOSEC) 20 MG capsule Take 20 mg by mouth daily.    Marland Kitchen triamcinolone cream (KENALOG) 0.1 % Apply topically 2 (two) times daily. Apply topically 30 g 3  . XOPENEX HFA 45 MCG/ACT inhaler Inhale 1-2 puffs into the lungs every 6 (six) hours as needed for wheezing. 1 Inhaler 12  . cyclobenzaprine (FLEXERIL) 10 MG tablet Take 10 mg by mouth 3 (three) times daily as needed for muscle spasms. (Patient not taking: Reported on 12/05/2019)     No facility-administered medications prior to visit.    Allergies  Allergen Reactions  . Amoxicillin Other ([place] Comments)    Rash  . Nitrous  Oxide Other ([place] Comments)    Paradoxical response-got very hyper  . Penicillins Other ([place] Comments)    Rash  . Sodium Chloride Swelling    And increased BP    Review of Systems  Constitutional: Negative for chills, fever and malaise/fatigue.  HENT: Negative for congestion and hearing loss.   Eyes: Negative for discharge.  Respiratory: Negative for cough, sputum production and shortness of breath.   Cardiovascular: Negative for chest pain, palpitations and leg swelling.  Gastrointestinal: Negative for abdominal pain, blood in stool, constipation, diarrhea, heartburn, nausea and vomiting.  Genitourinary: Negative for dysuria, frequency, hematuria and urgency.  Musculoskeletal: Negative for back pain, falls and myalgias.  Skin: Negative for rash.  Neurological: Negative for dizziness, sensory change, loss of consciousness, weakness and headaches.  Endo/Heme/Allergies: Negative for environmental allergies. Does not bruise/bleed easily.  Psychiatric/Behavioral: Negative for depression and suicidal ideas. The patient is nervous/anxious. The patient does not have insomnia.        Objective:    Physical Exam Constitutional:      General: She is not in acute distress.    Appearance: She is well-developed.  HENT:     Head: Normocephalic and atraumatic.     Right Ear: Tympanic membrane, ear canal and external ear normal.     Left Ear: Tympanic membrane, ear canal and external ear normal.  Eyes:     General:        Right eye: No discharge.        Left eye: No discharge.     Extraocular Movements: Extraocular movements intact.     Conjunctiva/sclera: Conjunctivae normal.     Pupils: Pupils are equal, round, and reactive to light.  Neck:     Thyroid: No thyromegaly.  Cardiovascular:     Rate and Rhythm: Normal rate and regular rhythm.     Heart sounds: Normal heart sounds. No murmur heard.   Pulmonary:     Effort: Pulmonary effort is normal. No respiratory distress.     Breath  sounds: Normal breath sounds. No wheezing.  Abdominal:     General: Bowel sounds are normal. There is no distension.     Palpations: Abdomen is soft. There is no mass.     Tenderness: There is no abdominal tenderness. There is no guarding.  Musculoskeletal:        General: Normal range of motion.     Cervical back: Neck supple.     Right lower leg: No edema.     Left lower leg: No edema.  Lymphadenopathy:     Cervical: No cervical adenopathy.  Skin:    General: Skin is warm and dry.  Neurological:     Mental Status: She is alert and oriented to person, place, and time.  Psychiatric:        Mood and Affect: Mood normal.        Behavior: Behavior normal.     BP 114/66 (BP Location: Left Arm)   Pulse 82   Temp 98.2 F (36.8 C) (Oral)   Resp 16   Ht 5' 1"  (1.549 m)   Wt 131 lb 12.8 oz (59.8 kg)   SpO2 99%   BMI 24.90 kg/m  Wt Readings from Last 3 Encounters:  12/05/19 131 lb 12.8 oz (59.8 kg)  07/05/19 129 lb (58.5 kg)  06/04/19 132 lb (59.9 kg)    Diabetic Foot Exam - Simple   No data filed     Lab Results  Component Value Date   WBC 4.4 12/05/2019   HGB 11.9 12/05/2019   HCT 36.5 12/05/2019   PLT 291 12/05/2019   GLUCOSE 88 12/05/2019   CHOL 201 (H) 12/05/2019   TRIG 149 12/05/2019   HDL 67 12/05/2019   LDLCALC 108 (H) 12/05/2019   ALT 12 12/05/2019   AST 18 12/05/2019   NA 140 12/05/2019  K 4.3 12/05/2019   CL 102 12/05/2019   CREATININE 0.74 12/05/2019   BUN 6 (L) 12/05/2019   CO2 29 12/05/2019   TSH 4.85 (H) 12/05/2019    Lab Results  Component Value Date   TSH 4.85 (H) 12/05/2019   Lab Results  Component Value Date   WBC 4.4 12/05/2019   HGB 11.9 12/05/2019   HCT 36.5 12/05/2019   MCV 93.6 12/05/2019   PLT 291 12/05/2019   Lab Results  Component Value Date   NA 140 12/05/2019   K 4.3 12/05/2019   CO2 29 12/05/2019   GLUCOSE 88 12/05/2019   BUN 6 (L) 12/05/2019   CREATININE 0.74 12/05/2019   BILITOT 0.4 12/05/2019   ALKPHOS 42  03/01/2019   AST 18 12/05/2019   ALT 12 12/05/2019   PROT 7.3 12/05/2019   ALBUMIN 4.6 03/01/2019   CALCIUM 9.2 12/05/2019   ANIONGAP 10 09/08/2015   GFR 103.98 03/01/2019   Lab Results  Component Value Date   CHOL 201 (H) 12/05/2019   Lab Results  Component Value Date   HDL 67 12/05/2019   Lab Results  Component Value Date   LDLCALC 108 (H) 12/05/2019   Lab Results  Component Value Date   TRIG 149 12/05/2019   Lab Results  Component Value Date   CHOLHDL 3.0 12/05/2019   No results found for: HGBA1C     Assessment & Plan:   Problem List Items Addressed This Visit    Thyroid disease    On Levothyroxine, continue to monitor      Hyperlipidemia, mixed    Encouraged heart healthy diet, increase exercise, avoid trans fats, consider a krill oil cap daily      Hypertension    Well controlled, no changes to meds. Encouraged heart healthy diet such as the DASH diet and exercise as tolerated.       Relevant Orders   CBC (Completed)   Comprehensive metabolic panel (Completed)   TSH (Completed)   Reactive airway disease    No recent exacerbation. No change in medications.       Preventative health care - Primary    Patient encouraged to maintain heart healthy diet, regular exercise, adequate sleep. Consider daily probiotics. Take medications as prescribed. Labs ordered and reviewed. MGM is ordered      Relevant Orders   Lipid panel (Completed)   Anxiety and depression    Doing well on current meds. Tolerates Alprazolam and uses it infrequently, may continue       Other Visit Diagnoses    Encounter for screening mammogram for malignant neoplasm of breast       Relevant Orders   MM 3D SCREEN BREAST BILATERAL      I am having Chaney H. Sobczyk maintain her cyclobenzaprine, aspirin, omeprazole, triamcinolone cream, meloxicam, nitroGLYCERIN, budesonide, Xopenex HFA, diclofenac sodium, ACETAMINOPHEN-BUTALBITAL, ketoconazole, atenolol, hydrochlorothiazide, diltiazem,  methocarbamol, levothyroxine, and ALPRAZolam.  No orders of the defined types were placed in this encounter.    Penni Homans, MD

## 2019-12-13 ENCOUNTER — Encounter: Payer: Self-pay | Admitting: Family Medicine

## 2019-12-15 ENCOUNTER — Encounter: Payer: Self-pay | Admitting: Family Medicine

## 2019-12-16 MED ORDER — ATENOLOL 25 MG PO TABS
25.0000 mg | ORAL_TABLET | Freq: Every day | ORAL | 0 refills | Status: DC
Start: 2019-12-16 — End: 2020-03-23

## 2019-12-16 MED ORDER — TRIAMCINOLONE ACETONIDE 0.1 % EX CREA
TOPICAL_CREAM | Freq: Two times a day (BID) | CUTANEOUS | 3 refills | Status: DC
Start: 2019-12-16 — End: 2021-01-28

## 2019-12-16 MED ORDER — DILTIAZEM HCL 30 MG PO TABS
ORAL_TABLET | ORAL | 0 refills | Status: DC
Start: 2019-12-16 — End: 2020-12-11

## 2019-12-16 MED ORDER — HYDROCHLOROTHIAZIDE 12.5 MG PO CAPS
12.5000 mg | ORAL_CAPSULE | Freq: Every day | ORAL | 0 refills | Status: DC
Start: 2019-12-16 — End: 2020-12-14

## 2020-01-01 DIAGNOSIS — M5459 Other low back pain: Secondary | ICD-10-CM | POA: Diagnosis not present

## 2020-01-01 DIAGNOSIS — M542 Cervicalgia: Secondary | ICD-10-CM | POA: Diagnosis not present

## 2020-01-03 ENCOUNTER — Encounter: Payer: Self-pay | Admitting: Family Medicine

## 2020-01-06 MED ORDER — KETOCONAZOLE 2 % EX SHAM
1.0000 | MEDICATED_SHAMPOO | CUTANEOUS | 1 refills | Status: DC
Start: 2020-01-06 — End: 2022-05-09

## 2020-01-31 DIAGNOSIS — M542 Cervicalgia: Secondary | ICD-10-CM | POA: Diagnosis not present

## 2020-01-31 DIAGNOSIS — M5459 Other low back pain: Secondary | ICD-10-CM | POA: Diagnosis not present

## 2020-02-21 ENCOUNTER — Ambulatory Visit (HOSPITAL_BASED_OUTPATIENT_CLINIC_OR_DEPARTMENT_OTHER): Payer: BC Managed Care – PPO

## 2020-03-02 DIAGNOSIS — M542 Cervicalgia: Secondary | ICD-10-CM | POA: Diagnosis not present

## 2020-03-06 ENCOUNTER — Ambulatory Visit (HOSPITAL_BASED_OUTPATIENT_CLINIC_OR_DEPARTMENT_OTHER): Payer: BC Managed Care – PPO

## 2020-03-09 ENCOUNTER — Other Ambulatory Visit: Payer: Self-pay

## 2020-03-09 ENCOUNTER — Encounter (HOSPITAL_BASED_OUTPATIENT_CLINIC_OR_DEPARTMENT_OTHER): Payer: Self-pay

## 2020-03-09 ENCOUNTER — Ambulatory Visit (HOSPITAL_BASED_OUTPATIENT_CLINIC_OR_DEPARTMENT_OTHER)
Admission: RE | Admit: 2020-03-09 | Discharge: 2020-03-09 | Disposition: A | Discharge status: Home / Self Care | Payer: BC Managed Care – PPO | Source: Ambulatory Visit | Attending: Family Medicine | Admitting: Family Medicine

## 2020-03-09 DIAGNOSIS — Z1231 Encounter for screening mammogram for malignant neoplasm of breast: Secondary | ICD-10-CM | POA: Diagnosis not present

## 2020-03-11 ENCOUNTER — Other Ambulatory Visit: Payer: Self-pay | Admitting: Family Medicine

## 2020-03-11 DIAGNOSIS — R928 Other abnormal and inconclusive findings on diagnostic imaging of breast: Secondary | ICD-10-CM

## 2020-03-20 ENCOUNTER — Encounter: Payer: Self-pay | Admitting: Family Medicine

## 2020-03-22 ENCOUNTER — Encounter: Payer: Self-pay | Admitting: Family Medicine

## 2020-03-23 ENCOUNTER — Encounter: Payer: Self-pay | Admitting: Family Medicine

## 2020-03-23 ENCOUNTER — Other Ambulatory Visit: Payer: Self-pay | Admitting: Family Medicine

## 2020-03-23 ENCOUNTER — Telehealth (INDEPENDENT_AMBULATORY_CARE_PROVIDER_SITE_OTHER): Payer: BC Managed Care – PPO | Admitting: Family Medicine

## 2020-03-23 VITALS — Ht 61.0 in

## 2020-03-23 DIAGNOSIS — Z5329 Procedure and treatment not carried out because of patient's decision for other reasons: Secondary | ICD-10-CM

## 2020-03-23 MED ORDER — ATENOLOL 25 MG PO TABS
25.0000 mg | ORAL_TABLET | Freq: Every day | ORAL | 3 refills | Status: DC
Start: 2020-03-23 — End: 2020-12-11

## 2020-03-23 NOTE — Telephone Encounter (Signed)
Pt is scheduled for vv today at 4:30 with Dr. Martinique

## 2020-03-23 NOTE — Telephone Encounter (Signed)
Pt is scheduled for vv today with Dr. Martinique at [facility]

## 2020-03-24 ENCOUNTER — Telehealth (INDEPENDENT_AMBULATORY_CARE_PROVIDER_SITE_OTHER): Payer: BC Managed Care – PPO | Admitting: Family

## 2020-03-24 ENCOUNTER — Telehealth: Payer: Self-pay

## 2020-03-24 ENCOUNTER — Other Ambulatory Visit: Payer: Self-pay

## 2020-03-24 DIAGNOSIS — J309 Allergic rhinitis, unspecified: Secondary | ICD-10-CM

## 2020-03-24 DIAGNOSIS — J45909 Unspecified asthma, uncomplicated: Secondary | ICD-10-CM

## 2020-03-24 DIAGNOSIS — U071 COVID-19: Secondary | ICD-10-CM

## 2020-03-24 MED ORDER — PREDNISONE 10 MG PO TABS: ORAL_TABLET | ORAL | 0 refills | Status: DC

## 2020-03-24 MED ORDER — AZITHROMYCIN 250 MG PO TABS
ORAL_TABLET | ORAL | 0 refills | Status: DC
Start: 2020-03-24 — End: 2020-06-04

## 2020-03-24 MED ORDER — XOPENEX HFA 45 MCG/ACT IN AERO
1.0000 | INHALATION_SPRAY | Freq: Four times a day (QID) | RESPIRATORY_TRACT | 12 refills | Status: DC | PRN
Start: 2020-03-24 — End: 2020-05-01

## 2020-03-24 MED ORDER — MONTELUKAST SODIUM 10 MG PO TABS: 10.0000 mg | ORAL_TABLET | Freq: Every day | ORAL | 3 refills | Status: DC

## 2020-03-24 MED ORDER — BENZONATATE 100 MG PO CAPS: 100.0000 mg | ORAL_CAPSULE | Freq: Three times a day (TID) | ORAL | 0 refills | Status: DC | PRN

## 2020-03-24 NOTE — Telephone Encounter (Signed)
Patient has a virtual later today with [place]

## 2020-03-24 NOTE — Progress Notes (Signed)
Virtual Visit via Video Note  I connected with April Jackson on 03/24/20 at  5:40 PM EST by a video enabled telemedicine application and verified that I am speaking with the correct person using two identifiers.  Location: Patient: home Provider: work   I discussed the limitations of evaluation and management by telemedicine and the availability of in person appointments. The patient expressed understanding and agreed to proceed. Only the patient and myself were present for today's video call.   History of Present Illness:  Patient is a 46 yr old female who presents today with chief complaint of cough. Reports that she developed nasal congestion initially on 1/30. She then developed night sweats, coughing, fatigue and nasal congestion. Started some cold/flu medication which helped with the nasal congestion. She tested + for COVID on the 03/21/20.   She denies SOB during the day. Her main complaints today are persistent cough, wheezing and fatigue.    Observations/Objective:   Gen: Awake, alert, no acute distress Resp: Breathing is even and non-labored Psych: calm/pleasant demeanor Neuro: Alert and Oriented x 3, + facial symmetry, speech is clear.   Assessment and Plan:  Covid-19 infection with cough- Given duration of symptoms and asthma hx will treat as follows:   will rx with azithromycin 211m, tessalon 100 mg TID PRN, prednisone taper- [place] order for dosing details. She is requesting refill on xopenex. Refill provided.     Asthma/allergic rhinitis- pt is requesting trial of singulair. Will initiate 177monce daily.   Follow Up Instructions:    I discussed the assessment and treatment plan with the patient. The patient was provided an opportunity to ask questions and all were answered. The patient agreed with the plan and demonstrated an understanding of the instructions.   The patient was advised to call back or seek an in-person evaluation if the symptoms worsen or if the  condition fails to improve as anticipated.  MeNance PearNP

## 2020-03-24 NOTE — Telephone Encounter (Signed)
PT is needing to make a follow up appt.  Telephone: [phone]

## 2020-04-02 DIAGNOSIS — M542 Cervicalgia: Secondary | ICD-10-CM | POA: Diagnosis not present

## 2020-04-10 ENCOUNTER — Other Ambulatory Visit: Payer: Self-pay

## 2020-04-10 ENCOUNTER — Ambulatory Visit
Admission: RE | Admit: 2020-04-10 | Discharge: 2020-04-10 | Disposition: A | Discharge status: Home / Self Care | Payer: BC Managed Care – PPO | Source: Ambulatory Visit | Attending: Family Medicine | Admitting: Family Medicine

## 2020-04-10 DIAGNOSIS — R928 Other abnormal and inconclusive findings on diagnostic imaging of breast: Secondary | ICD-10-CM

## 2020-04-10 DIAGNOSIS — N6313 Unspecified lump in the right breast, lower outer quadrant: Secondary | ICD-10-CM | POA: Diagnosis not present

## 2020-04-10 DIAGNOSIS — R922 Inconclusive mammogram: Secondary | ICD-10-CM | POA: Diagnosis not present

## 2020-04-30 DIAGNOSIS — M542 Cervicalgia: Secondary | ICD-10-CM | POA: Diagnosis not present

## 2020-05-01 ENCOUNTER — Encounter: Payer: Self-pay | Admitting: Family Medicine

## 2020-05-01 ENCOUNTER — Other Ambulatory Visit: Payer: Self-pay | Admitting: Family Medicine

## 2020-05-01 DIAGNOSIS — Z23 Encounter for immunization: Secondary | ICD-10-CM | POA: Diagnosis not present

## 2020-05-01 DIAGNOSIS — Z9889 Other specified postprocedural states: Secondary | ICD-10-CM | POA: Diagnosis not present

## 2020-05-01 DIAGNOSIS — G4733 Obstructive sleep apnea (adult) (pediatric): Secondary | ICD-10-CM | POA: Diagnosis not present

## 2020-05-01 DIAGNOSIS — R002 Palpitations: Secondary | ICD-10-CM | POA: Diagnosis not present

## 2020-05-01 MED ORDER — XOPENEX HFA 45 MCG/ACT IN AERO: 1.0000 | INHALATION_SPRAY | Freq: Four times a day (QID) | RESPIRATORY_TRACT | 12 refills | Status: DC | PRN

## 2020-05-31 DIAGNOSIS — M542 Cervicalgia: Secondary | ICD-10-CM | POA: Diagnosis not present

## 2020-06-04 ENCOUNTER — Encounter: Payer: Self-pay | Admitting: Family Medicine

## 2020-06-04 ENCOUNTER — Ambulatory Visit: Payer: BC Managed Care – PPO | Admitting: Family Medicine

## 2020-06-04 ENCOUNTER — Other Ambulatory Visit: Payer: Self-pay

## 2020-06-04 VITALS — BP 120/74 | HR 71 | Temp 98.2°F | Resp 16 | Wt 126.4 lb

## 2020-06-04 DIAGNOSIS — L309 Dermatitis, unspecified: Secondary | ICD-10-CM | POA: Diagnosis not present

## 2020-06-04 DIAGNOSIS — D539 Nutritional anemia, unspecified: Secondary | ICD-10-CM

## 2020-06-04 DIAGNOSIS — J4520 Mild intermittent asthma, uncomplicated: Secondary | ICD-10-CM

## 2020-06-04 DIAGNOSIS — K219 Gastro-esophageal reflux disease without esophagitis: Secondary | ICD-10-CM

## 2020-06-04 DIAGNOSIS — Z1211 Encounter for screening for malignant neoplasm of colon: Secondary | ICD-10-CM | POA: Diagnosis not present

## 2020-06-04 DIAGNOSIS — E782 Mixed hyperlipidemia: Secondary | ICD-10-CM

## 2020-06-04 DIAGNOSIS — R1013 Epigastric pain: Secondary | ICD-10-CM

## 2020-06-04 DIAGNOSIS — I10 Essential (primary) hypertension: Secondary | ICD-10-CM | POA: Diagnosis not present

## 2020-06-04 DIAGNOSIS — D509 Iron deficiency anemia, unspecified: Secondary | ICD-10-CM

## 2020-06-04 DIAGNOSIS — E079 Disorder of thyroid, unspecified: Secondary | ICD-10-CM | POA: Diagnosis not present

## 2020-06-04 MED ORDER — OMEPRAZOLE 40 MG PO CPDR: 40.0000 mg | DELAYED_RELEASE_CAPSULE | Freq: Every day | ORAL | 3 refills | Status: DC

## 2020-06-04 MED ORDER — MOMETASONE FUROATE 0.1 % EX CREA: TOPICAL_CREAM | Freq: Every day | CUTANEOUS | 1 refills | Status: AC

## 2020-06-04 NOTE — Assessment & Plan Note (Signed)
Well controlled, no changes to meds. Encouraged heart healthy diet such as the DASH diet and exercise as tolerated.  °

## 2020-06-04 NOTE — Assessment & Plan Note (Signed)
On arms near wrist. Triamcinolone helps some but then returns. Try Elocon and if no improvement then referred to dermatology for evaluation

## 2020-06-04 NOTE — Assessment & Plan Note (Signed)
Check Vitamin B 12 and Intrinsic Factor

## 2020-06-04 NOTE — Assessment & Plan Note (Signed)
Encouraged heart healthy diet, increase exercise, avoid trans fats, consider a krill oil cap daily 

## 2020-06-04 NOTE — Assessment & Plan Note (Signed)
On Levothyroxine, continue to monitor 

## 2020-06-04 NOTE — Assessment & Plan Note (Signed)
Previously well controlled on Omeprazole 20 mg but now having frequent breakthrough pain and discomfort. Started in last month as she recovered from [place]. Check H Pylori and increase Omeprazole to 40 mg daily and will refer for evaluation if does not improve in next month or two

## 2020-06-04 NOTE — Progress Notes (Signed)
Patient ID: April Jackson, female    DOB: 06/04/1975  Age: 45 y.o. MRN: [id number]    Subjective:  Subjective  HPI Anona H Barsamian presents for office visit today for follow up on chronic medical concerns. She reports that she has been experiencing acid reflux and she states that she took meprasol, but it did not help. She states that the symptoms worsen when she is at work. She endorses that over 2 months ago she had a COVID-19 infection, which might have been related to the acid reflux. She denies any chest pain, SOB, fever, abdominal pain, cough, chills, sore throat, dysuria, urinary incontinence, back pain, HA, or N/VD. She states that she took triamcinolone topical cream, but she noticed that she still had the dermatitis bilaterally in her wrists. She reports that due to her anemia, she has been feeling cold. She expresses concern regarding the weakness of her immune systems due to having multiple bronchitis infections in college.   Review of Systems  Constitutional: Negative for chills, fatigue and fever.  HENT: Negative for congestion, rhinorrhea, sinus pressure, sinus pain and sore throat.   Eyes: Negative for pain.  Respiratory: Negative for cough and shortness of breath.   Cardiovascular: Negative for chest pain, palpitations and leg swelling.  Gastrointestinal: Negative for abdominal pain, blood in stool, constipation, diarrhea, nausea and vomiting.       (+) acid reflux  Genitourinary: Negative for decreased urine volume, flank pain, frequency, vaginal bleeding and vaginal discharge.  Musculoskeletal: Negative for back pain.  Skin:       (+) bilateral dermatitis in wrists  Neurological: Negative for headaches.    History Past Medical History:  Diagnosis Date  . Allergy    childhood allergies, hives.   . Anemia 04/15/2016  . Anxiety and depression 09/21/2018  . Back pain 09/28/2014  . Back pain affecting pregnancy, antepartum 09/28/2014  . Cervical cancer screening 01/27/2015    Menarche at 11 Regular and moderate flow No history of abnormal pap in past No sexual activity G0P0, s/p Nohistory of abnormal MGM No concerns today No gyn surgeries  . FH: breast cancer 09/28/2014  . Hyperlipidemia, mixed 09/28/2014  . Hypertension   . Pruritus 09/28/2014  . Reactive airway disease 04/15/2016  . Seasonal allergies   . SVT (supraventricular tachycardia) ([place])   . Thyroid disease     She has no past surgical history on file.   Her family history includes Asthma (age of onset: 56) in her sister; Cancer in her mother and sister; Cancer (age of onset: 70) in her sister; Cholelithiasis in her sister; Diabetes in her maternal grandmother and mother; Gout in her sister; Heart disease in her father and paternal grandfather; Hyperlipidemia in her mother and sister; Hypertension in her mother, sister, and sister; Ovarian cysts in her sister; Pulmonary fibrosis in her father; Stroke in her paternal grandmother; Thyroid disease in her sister.She reports that she has never smoked. She has never used smokeless tobacco. She reports that she does not drink alcohol and does not use drugs.  Current Outpatient Medications on File Prior to Visit  Medication Sig Dispense Refill  . ACETAMINOPHEN-BUTALBITAL 50-325 MG TABS Take 1 tablet by mouth 3 (three) times daily as needed. 120 tablet 0  . ALPRAZolam (XANAX) 0.25 MG tablet Take 1 tablet (0.25 mg total) by mouth 2 (two) times daily as needed for anxiety. 20 tablet 1  . aspirin 325 MG tablet Take 325 mg by mouth daily.    Marland Kitchen atenolol (TENORMIN)  25 MG tablet Take 1 tablet (25 mg total) by mouth daily. 30 tablet 3  . budesonide (RHINOCORT AQUA) 32 MCG/ACT nasal spray Place 2 sprays into both nostrils daily. 8.6 g 2  . diclofenac sodium (VOLTAREN) 1 % GEL Apply 4 g topically 4 (four) times daily. 3 Tube 1  . diltiazem (CARDIZEM) 30 MG tablet TAKE 1 TABLET (30 MG TOTAL) BY MOUTH 2 (TWO) TIMES DAILY. ADDITIONALLY, TAKE 1 TABLET BY MOUTH EVERY FOUR TO SIX  HOURS AS NEEDED FOR SUSTAINED HEART RATES GREATER THAN 100 BPM. 120 tablet 0  . hydrochlorothiazide (MICROZIDE) 12.5 MG capsule Take 1 capsule (12.5 mg total) by mouth daily. 30 capsule 0  . ketoconazole (NIZORAL) 2 % shampoo Apply 1 application topically 2 (two) times a week. 120 mL 1  . levothyroxine (SYNTHROID) 25 MCG tablet Take 1 tablet (25 mcg total) by mouth daily before breakfast. 90 tablet 1  . meloxicam (MOBIC) 15 MG tablet Take 1 tablet (15 mg total) by mouth daily as needed for pain. 30 tablet 2  . methocarbamol (ROBAXIN) 500 MG tablet Take 1 tablet (500 mg total) by mouth every 8 (eight) hours as needed. 90 tablet 2  . montelukast (SINGULAIR) 10 MG tablet Take 1 tablet (10 mg total) by mouth at bedtime. 30 tablet 3  . nitroGLYCERIN (NITRODUR - DOSED IN MG/24 HR) 0.2 mg/hr patch Apply 1/4th patch to affected shoulder, change daily 30 patch 1  . triamcinolone cream (KENALOG) 0.1 % Apply topically 2 (two) times daily. Apply topically 30 g 3  . XOPENEX HFA 45 MCG/ACT inhaler Inhale 1-2 puffs into the lungs every 6 (six) hours as needed for wheezing. 1 each 12   No current facility-administered medications on file prior to visit.     Objective:  Objective  Physical Exam Constitutional:      General: She is not in acute distress.    Appearance: Normal appearance. She is not ill-appearing or toxic-appearing.  HENT:     Head: Normocephalic and atraumatic.     Right Ear: Tympanic membrane, ear canal and external ear normal.     Left Ear: Tympanic membrane, ear canal and external ear normal.     Nose: No congestion or rhinorrhea.  Eyes:     Extraocular Movements: Extraocular movements intact.     Pupils: Pupils are equal, round, and reactive to light.  Cardiovascular:     Rate and Rhythm: Normal rate and regular rhythm.     Pulses: Normal pulses.     Heart sounds: Normal heart sounds. No murmur heard.   Pulmonary:     Effort: Pulmonary effort is normal. No respiratory distress.      Breath sounds: Normal breath sounds. No wheezing, rhonchi or rales.  Abdominal:     General: Bowel sounds are normal.     Palpations: Abdomen is soft. There is no mass.     Tenderness: There is no abdominal tenderness. There is no guarding.     Hernia: No hernia is present.  Musculoskeletal:        General: Normal range of motion.     Cervical back: Normal range of motion and neck supple.  Skin:    General: Skin is warm and dry.  Neurological:     Mental Status: She is alert and oriented to person, place, and time.  Psychiatric:        Behavior: Behavior normal.    BP 120/74   Pulse 71   Temp 98.2 F (36.8 C)  Resp 16   Wt 126 lb 6.4 oz (57.3 kg)   SpO2 99%   BMI 23.88 kg/m  Wt Readings from Last 3 Encounters:  06/04/20 126 lb 6.4 oz (57.3 kg)  12/05/19 131 lb 12.8 oz (59.8 kg)  07/05/19 129 lb (58.5 kg)     Lab Results  Component Value Date   WBC 4.4 12/05/2019   HGB 11.9 12/05/2019   HCT 36.5 12/05/2019   PLT 291 12/05/2019   GLUCOSE 88 12/05/2019   CHOL 201 (H) 12/05/2019   TRIG 149 12/05/2019   HDL 67 12/05/2019   LDLCALC 108 (H) 12/05/2019   ALT 12 12/05/2019   AST 18 12/05/2019   NA 140 12/05/2019   K 4.3 12/05/2019   CL 102 12/05/2019   CREATININE 0.74 12/05/2019   BUN 6 (L) 12/05/2019   CO2 29 12/05/2019   TSH 4.85 (H) 12/05/2019    US BREAST LTD UNI RIGHT INC AXILLA  Addendum Date: 04/14/2020   ADDENDUM REPORT: 04/14/2020 09:14 ADDENDUM: A right-sided ultrasound was performed. Electronically Signed   By: Dorise Bullion III M.D   On: 04/14/2020 09:14   Result Date: 04/14/2020 CLINICAL DATA:  The patient was called back a right breast mass. EXAM: DIGITAL DIAGNOSTIC UNILATERAL RIGHT MAMMOGRAM WITH TOMOSYNTHESIS AND CAD TECHNIQUE: Right digital diagnostic mammography and breast tomosynthesis was performed. The images were evaluated with computer-aided detection. COMPARISON:  Previous exam(s). ACR Breast Density Category c: The breast tissue is  heterogeneously dense, which may obscure small masses. FINDINGS: Appear to be multiple masses in the lower outer right breast On physical exam, no suspicious lumps are identified. Targeted ultrasound is performed, showing fibrocystic changes right breast at 8 o'clock accounting for mammographically identified masses. IMPRESSION: Fibrocystic changes.  No evidence of malignancy. RECOMMENDATION: Annual screening mammography. I have discussed the findings and recommendations with the patient. If applicable, a reminder letter will be sent to the patient regarding the next appointment. BI-RADS CATEGORY  2: Benign. Electronically Signed: By: Dorise Bullion III M.D On: 04/10/2020 09:57   MM DIAG BREAST TOMO UNI RIGHT  Addendum Date: 04/13/2020   ADDENDUM REPORT: 04/13/2020 12:50 ADDENDUM: A right-sided ultrasound was performed. Electronically Signed   By: Dorise Bullion III M.D   On: 04/13/2020 12:50   Result Date: 04/13/2020 CLINICAL DATA:  The patient was called back a right breast mass. EXAM: DIGITAL DIAGNOSTIC UNILATERAL RIGHT MAMMOGRAM WITH TOMOSYNTHESIS AND CAD TECHNIQUE: Right digital diagnostic mammography and breast tomosynthesis was performed. The images were evaluated with computer-aided detection. COMPARISON:  Previous exam(s). ACR Breast Density Category c: The breast tissue is heterogeneously dense, which may obscure small masses. FINDINGS: Appear to be multiple masses in the lower outer right breast On physical exam, no suspicious lumps are identified. Targeted ultrasound is performed, showing fibrocystic changes right breast at 8 o'clock accounting for mammographically identified masses. IMPRESSION: Fibrocystic changes.  No evidence of malignancy. RECOMMENDATION: Annual screening mammography. I have discussed the findings and recommendations with the patient. If applicable, a reminder letter will be sent to the patient regarding the next appointment. BI-RADS CATEGORY  2: Benign. Electronically  Signed: By: Dorise Bullion III M.D On: 04/10/2020 09:57     Assessment & Plan:  Plan    Meds ordered this encounter  Medications  . mometasone (ELOCON) 0.1 % cream    Sig: Apply topically daily.    Dispense:  50 g    Refill:  1  . omeprazole (PRILOSEC) 40 MG capsule    Sig: Take  1 capsule (40 mg total) by mouth daily.    Dispense:  30 capsule    Refill:  3    Problem List Items Addressed This Visit    Thyroid disease    On Levothyroxine, continue to monitor      Relevant Orders   TSH   T4, free   Hyperlipidemia, mixed    Encouraged heart healthy diet, increase exercise, avoid trans fats, consider a krill oil cap daily      Relevant Orders   Lipid panel   Hypertension    Well controlled, no changes to meds. Encouraged heart healthy diet such as the DASH diet and exercise as tolerated.       Relevant Orders   Comprehensive metabolic panel   Reactive airway disease    Has been well controlled recently. Used Xopenex twice in past month with good results.       Macrocytic anemia    Check Vitamin B 12 and Intrinsic Factor      Relevant Orders   Vitamin B12   Intrinsic Factor Antibodies   Screening for colon cancer - Primary    Referred for screening colonoscopy      Relevant Orders   Ambulatory referral to Gastroenterology   Acid reflux    Previously well controlled on Omeprazole 20 mg but now having frequent breakthrough pain and discomfort. Started in last month as she recovered from [place]. Check H Pylori and increase Omeprazole to 40 mg daily and will refer for evaluation if does not improve in next month or two      Relevant Medications   omeprazole (PRILOSEC) 40 MG capsule   Dermatitis    On arms near wrist. Triamcinolone helps some but then returns. Try Elocon and if no improvement then referred to dermatology for evaluation      Relevant Orders   Ambulatory referral to Dermatology    Other Visit Diagnoses    Dyspepsia       Relevant Orders    H. pylori antibody, IgG   Epigastric pain       Relevant Orders   H. pylori antibody, IgG      Follow-up: Return in about 3 months (around 09/03/2020).   I,David Hanna,acting as a scribe for Penni Homans, MD.,have documented all relevant documentation on the behalf of Penni Homans, MD,as directed by  Penni Homans, MD while in the presence of Penni Homans, MD.  I, [name], MD personally performed the services described in this documentation. All medical record entries made by the scribe were at my direction and in my presence. I have reviewed the chart and agree that the record reflects my personal performance and is accurate and complete

## 2020-06-04 NOTE — Assessment & Plan Note (Signed)
Has been well controlled recently. Used Xopenex twice in past month with good results.

## 2020-06-04 NOTE — Patient Instructions (Signed)
Food Choices for Gastroesophageal Reflux Disease, Adult When you have gastroesophageal reflux disease (GERD), the foods you eat and your eating habits are very important. Choosing the right foods can help ease your discomfort. Think about working with a food expert (dietitian) to help you make good choices. What are tips for following this plan? Reading food labels  Look for foods that are low in saturated fat. Foods that may help with your symptoms include: ? Foods that have less than 5% of daily value (DV) of fat. ? Foods that have 0 grams of trans fat. Cooking  Do not fry your food.  Cook your food by baking, steaming, grilling, or broiling. These are all methods that do not need a lot of fat for cooking.  To add flavor, try to use herbs that are low in spice and acidity. Meal planning  Choose healthy foods that are low in fat, such as: ? Fruits and vegetables. ? Whole grains. ? Low-fat dairy products. ? Lean meats, fish, and poultry.  Eat small meals often instead of eating 3 large meals each day. Eat your meals slowly in a place where you are relaxed. Avoid bending over or lying down until 2-3 hours after eating.  Limit high-fat foods such as fatty meats or fried foods.  Limit your intake of fatty foods, such as oils, butter, and shortening.  Avoid the following as told by your doctor: ? Foods that cause symptoms. These may be different for different people. Keep a food diary to keep track of foods that cause symptoms. ? Alcohol. ? Drinking a lot of liquid with meals. ? Eating meals during the 2-3 hours before bed.   Lifestyle  Stay at a healthy weight. Ask your doctor what weight is healthy for you. If you need to lose weight, work with your doctor to do so safely.  Exercise for at least 30 minutes on 5 or more days each week, or as told by your doctor.  Wear loose-fitting clothes.  Do not smoke or use any products that contain nicotine or tobacco. If you need help  quitting, ask your doctor.  Sleep with the head of your bed higher than your feet. Use a wedge under the mattress or blocks under the bed frame to raise the head of the bed.  Chew sugar-free gum after meals. What foods should eat? Eat a healthy, well-balanced diet of fruits, vegetables, whole grains, low-fat dairy products, lean meats, fish, and poultry. Each person is different. Foods that may cause symptoms in one person may not cause any symptoms in another person. Work with your doctor to find foods that are safe for you. The items listed above may not be a complete list of what you can eat and drink. Contact a food expert for more options.   What foods should I avoid? Limiting some of these foods may help in managing the symptoms of GERD. Everyone is different. Talk with a food expert or your doctor to help you find the exact foods to avoid, if any. Fruits Any fruits prepared with added fat. Any fruits that cause symptoms. For some people, this may include citrus fruits, such as oranges, grapefruit, pineapple, and lemons. Vegetables Deep-fried vegetables. [place] fries. Any vegetables prepared with added fat. Any vegetables that cause symptoms. For some people, this may include tomatoes and tomato products, chili peppers, onions and garlic, and horseradish. Grains Pastries or quick breads with added fat. Meats and other proteins High-fat meats, such as fatty beef or pork,  hot dogs, ribs, ham, sausage, salami, and bacon. Fried meat or protein, including fried fish and fried chicken. Nuts and nut butters, in large amounts. Dairy Whole milk and chocolate milk. Sour cream. Cream. Ice cream. Cream cheese. Milkshakes. Fats and oils Butter. Margarine. Shortening. Ghee. Beverages Coffee and tea, with or without caffeine. Carbonated beverages. Sodas. Energy drinks. Fruit juice made with acidic fruits, such as orange or grapefruit. Tomato juice. Alcoholic drinks. Sweets and desserts Chocolate and  cocoa. Donuts. Seasonings and condiments Pepper. Peppermint and spearmint. Added salt. Any condiments, herbs, or seasonings that cause symptoms. For some people, this may include curry, hot sauce, or vinegar-based salad dressings. The items listed above may not be a complete list of what you should not eat and drink. Contact a food expert for more options. Questions to ask your doctor Diet and lifestyle changes are often the first steps that are taken to manage symptoms of GERD. If diet and lifestyle changes do not help, talk with your doctor about taking medicines. Where to find more information  [employer] G[employer]intestinal [employer]: aboutgerd.org Summary  When you have GERD, food and lifestyle choices are very important in easing your symptoms.  Eat small meals often instead of 3 large meals a day. Eat your meals slowly and in a place where you are relaxed.  Avoid bending over or lying down until 2-3 hours after eating.  Limit high-fat foods such as fatty meats or fried foods. This information is not intended to replace advice given to you by your health care provider. Make sure you discuss any questions you have with your health care provider. Document Revised: 08/12/2019 Document Reviewed: 08/12/2019 Elsevier Patient Education  [employer].

## 2020-06-04 NOTE — Assessment & Plan Note (Signed)
Referred for screening colonoscopy

## 2020-06-05 LAB — CBC
HCT: 36 % (ref 36.0–46.0)
Hemoglobin: 12.2 g/dL (ref 12.0–15.0)
MCHC: 33.9 g/dL (ref 30.0–36.0)
MCV: 91.6 fl (ref 78.0–100.0)
Platelets: 283 10*3/uL (ref 150.0–400.0)
RBC: 3.94 Mil/uL (ref 3.87–5.11)
RDW: 12.5 % (ref 11.5–15.5)
WBC: 5.3 10*3/uL (ref 4.0–10.5)

## 2020-06-05 LAB — T4, FREE: Free T4: 0.67 ng/dL (ref 0.60–1.60)

## 2020-06-05 LAB — LIPID PANEL
Cholesterol: 191 mg/dL (ref 0–200)
HDL: 68.4 mg/dL (ref >39.00)
LDL Cholesterol: 110 mg/dL — ABNORMAL HIGH (ref 0–99)
NonHDL: 122.84
Total CHOL/HDL Ratio: 3
Triglycerides: 62 mg/dL (ref 0.0–149.0)
VLDL: 12.4 mg/dL (ref 0.0–40.0)

## 2020-06-05 LAB — COMPREHENSIVE METABOLIC PANEL
ALT: 9 U/L (ref 0–35)
AST: 16 U/L (ref 0–37)
Albumin: 4.5 g/dL (ref 3.5–5.2)
Alkaline Phosphatase: 36 U/L — ABNORMAL LOW (ref 39–117)
BUN: 9 mg/dL (ref 6–23)
CO2: 31 mEq/L (ref 19–32)
Calcium: 9.4 mg/dL (ref 8.4–10.5)
Chloride: 99 mEq/L (ref 96–112)
Creatinine, Ser: 0.6 mg/dL (ref 0.40–1.20)
GFR: 107.57 mL/min (ref >60.00)
Glucose, Bld: 87 mg/dL (ref 70–99)
Potassium: 3.9 mEq/L (ref 3.5–5.1)
Sodium: 138 mEq/L (ref 135–145)
Total Bilirubin: 0.5 mg/dL (ref 0.2–1.2)
Total Protein: 7.5 g/dL (ref 6.0–8.3)

## 2020-06-05 LAB — VITAMIN B12: Vitamin B-12: >1506 pg/mL — ABNORMAL HIGH (ref 211–911)

## 2020-06-05 LAB — TSH: TSH: 3.44 u[IU]/mL (ref 0.35–4.50)

## 2020-06-05 LAB — H. PYLORI ANTIBODY, IGG: H Pylori IgG: NEGATIVE

## 2020-06-08 LAB — INTRINSIC FACTOR ANTIBODIES: Intrinsic Factor: UNDETERMINED — AB

## 2020-06-19 ENCOUNTER — Other Ambulatory Visit: Payer: Self-pay | Admitting: Family

## 2020-06-30 DIAGNOSIS — M542 Cervicalgia: Secondary | ICD-10-CM | POA: Diagnosis not present

## 2020-07-31 DIAGNOSIS — M542 Cervicalgia: Secondary | ICD-10-CM | POA: Diagnosis not present

## 2020-08-30 ENCOUNTER — Other Ambulatory Visit: Payer: Self-pay | Admitting: Family Medicine

## 2020-08-30 DIAGNOSIS — M542 Cervicalgia: Secondary | ICD-10-CM | POA: Diagnosis not present

## 2020-09-10 ENCOUNTER — Telehealth (INDEPENDENT_AMBULATORY_CARE_PROVIDER_SITE_OTHER): Payer: BC Managed Care – PPO | Admitting: Family Medicine

## 2020-09-10 ENCOUNTER — Other Ambulatory Visit: Payer: Self-pay

## 2020-09-10 DIAGNOSIS — I10 Essential (primary) hypertension: Secondary | ICD-10-CM | POA: Diagnosis not present

## 2020-09-10 DIAGNOSIS — K219 Gastro-esophageal reflux disease without esophagitis: Secondary | ICD-10-CM | POA: Diagnosis not present

## 2020-09-10 DIAGNOSIS — D539 Nutritional anemia, unspecified: Secondary | ICD-10-CM | POA: Diagnosis not present

## 2020-09-10 DIAGNOSIS — M5489 Other dorsalgia: Secondary | ICD-10-CM | POA: Diagnosis not present

## 2020-09-10 DIAGNOSIS — E782 Mixed hyperlipidemia: Secondary | ICD-10-CM

## 2020-09-10 DIAGNOSIS — J4520 Mild intermittent asthma, uncomplicated: Secondary | ICD-10-CM

## 2020-09-10 MED ORDER — MELOXICAM 15 MG PO TABS: 15.0000 mg | ORAL_TABLET | Freq: Every day | ORAL | 2 refills | Status: DC | PRN

## 2020-09-10 MED ORDER — METHOCARBAMOL 500 MG PO TABS: 500.0000 mg | ORAL_TABLET | Freq: Three times a day (TID) | ORAL | 2 refills | Status: DC | PRN

## 2020-09-12 NOTE — Assessment & Plan Note (Signed)
Using Xopenex prn

## 2020-09-12 NOTE — Assessment & Plan Note (Signed)
Well controlled, no changes to meds. Encouraged heart healthy diet such as the DASH diet and exercise as tolerated.  °

## 2020-09-12 NOTE — Progress Notes (Signed)
MyChart Video Visit    Virtual Visit via Video Note   This visit type was conducted due to national recommendations for restrictions regarding the COVID-19 Pandemic (e.g. social distancing) in an effort to limit this patient's exposure and mitigate transmission in our community. This patient is at least at moderate risk for complications without adequate follow up. This format is felt to be most appropriate for this patient at this time. Physical exam was limited by quality of the video and audio technology used for the visit. S Chism, CMA was able to get the patient set up on a video visit.  Patient location: work Patient and provider in visit Provider location: Office  I discussed the limitations of evaluation and management by telemedicine and the availability of in person appointments. The patient expressed understanding and agreed to proceed.  Visit Date: 09/10/2020  Today's healthcare provider: Penni Homans, MD     Subjective:    Patient ID: April Jackson, female    DOB: 1975/04/25, 45 y.o.   MRN: [id number]  No chief complaint on file.   HPI Patient is in today for follow up on chronic medical concerns. No recent febrile illness or hospitalizations. She continues to work hard. She has been struggling with some pain and notes the Robaxin and Meloxicam are helpful and she needs a refill. She has had some occasion to use her Xopenex recently. Denies CP/palp/HA/congestion/fevers/GI or GU c/o. Taking meds as prescribed   Past Medical History:  Diagnosis Date   Allergy    childhood allergies, hives.    Anemia 04/15/2016   Anxiety and depression 09/21/2018   Back pain 09/28/2014   Back pain affecting pregnancy, antepartum 09/28/2014   Cervical cancer screening 01/27/2015   Menarche at 11 Regular and moderate flow No history of abnormal pap in past No sexual activity G0P0, s/p Nohistory of abnormal MGM No concerns today No gyn surgeries   FH: breast cancer 09/28/2014    Hyperlipidemia, mixed 09/28/2014   Hypertension    Pruritus 09/28/2014   Reactive airway disease 04/15/2016   Seasonal allergies    SVT (supraventricular tachycardia) ([place])    Thyroid disease     No past surgical history on file.  Family History  Problem Relation Age of Onset   Heart disease Father    Pulmonary fibrosis Father        pneumonia   Cancer Sister 97       cervical cancer   Hypertension Sister    Hyperlipidemia Sister    Gout Sister    Diabetes Mother    Hypertension Mother    Hyperlipidemia Mother    Cancer Mother        cervical cancer   Diabetes Maternal Grandmother    Stroke Paternal Grandmother    Heart disease Paternal Grandfather        MI   Hypertension Sister    Cholelithiasis Sister    Thyroid disease Sister    Ovarian cysts Sister    Cancer Sister        breast   Asthma Sister 25    Social History   Socioeconomic History   Marital status: Single    Spouse name: Not on file   Number of children: 0   Years of education: Not on file   Highest education level: Not on file  Occupational History   Occupation: DENTIST    Employer: SELF EMPLOYED  Tobacco Use   Smoking status: Never   Smokeless tobacco: Never  Substance and Sexual Activity   Alcohol use: No   Drug use: No   Sexual activity: Not on file    Comment: Dentist, no dietary restriction, lives with sister  Other Topics Concern   Not on file  Social History Narrative   Not on file   Social Determinants of Health   Financial Resource Strain: Not on file  Food Insecurity: Not on file  Transportation Needs: Not on file  Physical Activity: Not on file  Stress: Not on file  Social Connections: Not on file  Intimate Partner Violence: Not on file    Outpatient Medications Prior to Visit  Medication Sig Dispense Refill   ACETAMINOPHEN-BUTALBITAL 50-325 MG TABS Take 1 tablet by mouth 3 (three) times daily as needed. 120 tablet 0   ALPRAZolam (XANAX) 0.25 MG tablet Take 1 tablet  (0.25 mg total) by mouth 2 (two) times daily as needed for anxiety. 20 tablet 1   aspirin 325 MG tablet Take 325 mg by mouth daily.     atenolol (TENORMIN) 25 MG tablet Take 1 tablet (25 mg total) by mouth daily. 30 tablet 3   budesonide (RHINOCORT AQUA) 32 MCG/ACT nasal spray Place 2 sprays into both nostrils daily. 8.6 g 2   diclofenac sodium (VOLTAREN) 1 % GEL Apply 4 g topically 4 (four) times daily. 3 Tube 1   diltiazem (CARDIZEM) 30 MG tablet TAKE 1 TABLET (30 MG TOTAL) BY MOUTH 2 (TWO) TIMES DAILY. ADDITIONALLY, TAKE 1 TABLET BY MOUTH EVERY FOUR TO SIX HOURS AS NEEDED FOR SUSTAINED HEART RATES GREATER THAN 100 BPM. 120 tablet 0   hydrochlorothiazide (MICROZIDE) 12.5 MG capsule Take 1 capsule (12.5 mg total) by mouth daily. 30 capsule 0   ketoconazole (NIZORAL) 2 % shampoo Apply 1 application topically 2 (two) times a week. 120 mL 1   levothyroxine (SYNTHROID) 25 MCG tablet Take 1 tablet (25 mcg total) by mouth daily before breakfast. 90 tablet 1   mometasone (ELOCON) 0.1 % cream Apply topically daily. 50 g 1   montelukast (SINGULAIR) 10 MG tablet TAKE 1 TABLET BY MOUTH EVERYDAY AT BEDTIME 90 tablet 1   nitroGLYCERIN (NITRODUR - DOSED IN MG/24 HR) 0.2 mg/hr patch Apply 1/4th patch to affected shoulder, change daily 30 patch 1   omeprazole (PRILOSEC) 40 MG capsule TAKE 1 CAPSULE (40 MG TOTAL) BY MOUTH DAILY. 90 capsule 1   triamcinolone cream (KENALOG) 0.1 % Apply topically 2 (two) times daily. Apply topically 30 g 3   XOPENEX HFA 45 MCG/ACT inhaler Inhale 1-2 puffs into the lungs every 6 (six) hours as needed for wheezing. 1 each 12   meloxicam (MOBIC) 15 MG tablet Take 1 tablet (15 mg total) by mouth daily as needed for pain. 30 tablet 2   methocarbamol (ROBAXIN) 500 MG tablet Take 1 tablet (500 mg total) by mouth every 8 (eight) hours as needed. 90 tablet 2   No facility-administered medications prior to visit.    Allergies  Allergen Reactions   Amoxicillin Other ([facility] Comments)     Rash   Nitrous Oxide Other ([place] Comments)    Paradoxical response-got very hyper   Penicillins Other ([place] Comments)    Rash   Sodium Chloride Swelling    And increased BP    Review of Systems  Constitutional:  Negative for fever and malaise/fatigue.  HENT:  Negative for congestion.   Eyes:  Negative for blurred vision.  Respiratory:  Positive for shortness of breath.   Cardiovascular:  Negative for chest pain,  palpitations and leg swelling.  Gastrointestinal:  Negative for abdominal pain, blood in stool and nausea.  Genitourinary:  Negative for dysuria and frequency.  Musculoskeletal:  Positive for back pain. Negative for falls.  Skin:  Negative for rash.  Neurological:  Negative for dizziness, loss of consciousness and headaches.  Endo/Heme/Allergies:  Negative for environmental allergies.  Psychiatric/Behavioral:  Negative for depression. The patient is not nervous/anxious.       Objective:    Physical Exam Constitutional:      General: She is not in acute distress.    Appearance: Normal appearance. She is not ill-appearing or toxic-appearing.  HENT:     Head: Normocephalic and atraumatic.     Right Ear: External ear normal.     Left Ear: External ear normal.     Nose: Nose normal.  Eyes:     General:        Right eye: No discharge.        Left eye: No discharge.  Pulmonary:     Effort: Pulmonary effort is normal.  Skin:    Findings: No rash.  Neurological:     Mental Status: She is alert and oriented to person, place, and time.  Psychiatric:        Behavior: Behavior normal.    There were no vitals taken for this visit. Wt Readings from Last 3 Encounters:  06/04/20 126 lb 6.4 oz (57.3 kg)  12/05/19 131 lb 12.8 oz (59.8 kg)  07/05/19 129 lb (58.5 kg)    Diabetic Foot Exam - Simple   No data filed    Lab Results  Component Value Date   WBC 5.3 06/04/2020   HGB 12.2 06/04/2020   HCT 36.0 06/04/2020   PLT 283.0 06/04/2020   GLUCOSE 87 06/04/2020    CHOL 191 06/04/2020   TRIG 62.0 06/04/2020   HDL 68.40 06/04/2020   LDLCALC 110 (H) 06/04/2020   ALT 9 06/04/2020   AST 16 06/04/2020   NA 138 06/04/2020   K 3.9 06/04/2020   CL 99 06/04/2020   CREATININE 0.60 06/04/2020   BUN 9 06/04/2020   CO2 31 06/04/2020   TSH 3.44 06/04/2020    Lab Results  Component Value Date   TSH 3.44 06/04/2020   Lab Results  Component Value Date   WBC 5.3 06/04/2020   HGB 12.2 06/04/2020   HCT 36.0 06/04/2020   MCV 91.6 06/04/2020   PLT 283.0 06/04/2020   Lab Results  Component Value Date   NA 138 06/04/2020   K 3.9 06/04/2020   CO2 31 06/04/2020   GLUCOSE 87 06/04/2020   BUN 9 06/04/2020   CREATININE 0.60 06/04/2020   BILITOT 0.5 06/04/2020   ALKPHOS 36 (L) 06/04/2020   AST 16 06/04/2020   ALT 9 06/04/2020   PROT 7.5 06/04/2020   ALBUMIN 4.5 06/04/2020   CALCIUM 9.4 06/04/2020   ANIONGAP 10 09/08/2015   GFR 107.57 06/04/2020   Lab Results  Component Value Date   CHOL 191 06/04/2020   Lab Results  Component Value Date   HDL 68.40 06/04/2020   Lab Results  Component Value Date   LDLCALC 110 (H) 06/04/2020   Lab Results  Component Value Date   TRIG 62.0 06/04/2020   Lab Results  Component Value Date   CHOLHDL 3 06/04/2020   No results found for: HGBA1C     Assessment & Plan:   Problem List Items Addressed This Visit     Hyperlipidemia, mixed  Encourage heart healthy diet such as MIND or DASH diet, increase exercise, avoid trans fats, simple carbohydrates and processed foods, consider a krill or fish or flaxseed oil cap daily.        Back pain    Refill given on Meloxicam and Robaxin. Encouraged moist heat and gentle stretching as tolerated. May try NSAIDs and prescription meds as directed and report if symptoms worsen or seek immediate care       Relevant Medications   meloxicam (MOBIC) 15 MG tablet   methocarbamol (ROBAXIN) 500 MG tablet   Hypertension    Well controlled, no changes to meds. Encouraged  heart healthy diet such as the DASH diet and exercise as tolerated.        Reactive airway disease    Using Xopenex prn       Macrocytic anemia    Multivitamin with iron and heart healthy diet       Acid reflux    Avoid offending foods, start probiotics. Do not eat large meals in late evening and consider raising head of bed.         I am having April Jackson maintain her aspirin, nitroGLYCERIN, budesonide, diclofenac sodium, ACETAMINOPHEN-BUTALBITAL, levothyroxine, ALPRAZolam, triamcinolone cream, diltiazem, hydrochlorothiazide, ketoconazole, atenolol, Xopenex HFA, mometasone, montelukast, omeprazole, meloxicam, and methocarbamol.  Meds ordered this encounter  Medications   meloxicam (MOBIC) 15 MG tablet    Sig: Take 1 tablet (15 mg total) by mouth daily as needed for pain.    Dispense:  30 tablet    Refill:  2   methocarbamol (ROBAXIN) 500 MG tablet    Sig: Take 1 tablet (500 mg total) by mouth every 8 (eight) hours as needed.    Dispense:  90 tablet    Refill:  2    I discussed the assessment and treatment plan with the patient. The patient was provided an opportunity to ask questions and all were answered. The patient agreed with the plan and demonstrated an understanding of the instructions.   The patient was advised to call back or seek an in-person evaluation if the symptoms worsen or if the condition fails to improve as anticipated.  I provided 20 minutes of face-to-face time during this encounter.   Penni Homans, MD [facility] [phone] (phone) [phone] (fax)  [place]

## 2020-09-12 NOTE — Assessment & Plan Note (Signed)
Encourage heart healthy diet such as MIND or DASH diet, increase exercise, avoid trans fats, simple carbohydrates and processed foods, consider a krill or fish or flaxseed oil cap daily.  °

## 2020-09-12 NOTE — Assessment & Plan Note (Signed)
Refill given on Meloxicam and Robaxin. Encouraged moist heat and gentle stretching as tolerated. May try NSAIDs and prescription meds as directed and report if symptoms worsen or seek immediate care

## 2020-09-12 NOTE — Assessment & Plan Note (Signed)
Multivitamin with iron and heart healthy diet

## 2020-09-12 NOTE — Assessment & Plan Note (Signed)
Avoid offending foods, start probiotics. Do not eat large meals in late evening and consider raising head of bed.  

## 2020-10-16 DIAGNOSIS — L2089 Other atopic dermatitis: Secondary | ICD-10-CM | POA: Diagnosis not present

## 2020-10-23 DIAGNOSIS — Z9889 Other specified postprocedural states: Secondary | ICD-10-CM | POA: Diagnosis not present

## 2020-10-23 DIAGNOSIS — R002 Palpitations: Secondary | ICD-10-CM | POA: Diagnosis not present

## 2020-10-23 DIAGNOSIS — Z23 Encounter for immunization: Secondary | ICD-10-CM | POA: Diagnosis not present

## 2020-10-23 DIAGNOSIS — G4733 Obstructive sleep apnea (adult) (pediatric): Secondary | ICD-10-CM | POA: Diagnosis not present

## 2020-10-31 DIAGNOSIS — M542 Cervicalgia: Secondary | ICD-10-CM | POA: Diagnosis not present

## 2020-11-24 NOTE — Progress Notes (Signed)
No show

## 2020-11-30 DIAGNOSIS — M542 Cervicalgia: Secondary | ICD-10-CM | POA: Diagnosis not present

## 2020-12-11 ENCOUNTER — Encounter: Payer: Self-pay | Admitting: Family Medicine

## 2020-12-11 DIAGNOSIS — E782 Mixed hyperlipidemia: Secondary | ICD-10-CM

## 2020-12-11 DIAGNOSIS — I10 Essential (primary) hypertension: Secondary | ICD-10-CM

## 2020-12-11 DIAGNOSIS — K219 Gastro-esophageal reflux disease without esophagitis: Secondary | ICD-10-CM

## 2020-12-11 MED ORDER — HYDROCHLOROTHIAZIDE 12.5 MG PO TABS: 12.5000 mg | ORAL_TABLET | Freq: Every day | ORAL | 3 refills | Status: DC

## 2020-12-11 MED ORDER — ATENOLOL 25 MG PO TABS: 25.0000 mg | ORAL_TABLET | Freq: Every day | ORAL | 3 refills | Status: DC

## 2020-12-11 MED ORDER — DILTIAZEM HCL 30 MG PO TABS: ORAL_TABLET | ORAL | 0 refills | Status: DC

## 2020-12-11 NOTE — Telephone Encounter (Signed)
Most of the medications that she has requested in another my-chart encounter where tablets and we have switched the HCTZ to tablets as well. I have sent her a message via my-chart in the other encounter.

## 2020-12-14 ENCOUNTER — Encounter: Payer: Self-pay | Admitting: Family Medicine

## 2020-12-14 ENCOUNTER — Ambulatory Visit: Payer: BC Managed Care – PPO | Admitting: Family Medicine

## 2020-12-14 ENCOUNTER — Other Ambulatory Visit: Payer: Self-pay

## 2020-12-14 VITALS — BP 100/60 | HR 65 | Temp 97.9°F | Ht 60.0 in | Wt 127.8 lb

## 2020-12-14 DIAGNOSIS — D539 Nutritional anemia, unspecified: Secondary | ICD-10-CM

## 2020-12-14 DIAGNOSIS — E782 Mixed hyperlipidemia: Secondary | ICD-10-CM | POA: Diagnosis not present

## 2020-12-14 DIAGNOSIS — L299 Pruritus, unspecified: Secondary | ICD-10-CM

## 2020-12-14 DIAGNOSIS — Z23 Encounter for immunization: Secondary | ICD-10-CM

## 2020-12-14 DIAGNOSIS — I10 Essential (primary) hypertension: Secondary | ICD-10-CM | POA: Diagnosis not present

## 2020-12-14 DIAGNOSIS — E079 Disorder of thyroid, unspecified: Secondary | ICD-10-CM | POA: Diagnosis not present

## 2020-12-14 NOTE — Assessment & Plan Note (Signed)
Encourage heart healthy diet such as MIND or DASH diet, increase exercise, avoid trans fats, simple carbohydrates and processed foods, consider a krill or fish or flaxseed oil cap daily.  °

## 2020-12-14 NOTE — Assessment & Plan Note (Signed)
Increase leafy greens, consider increased lean red meat and using cast iron cookware. Continue to monitor, report any concerns 

## 2020-12-14 NOTE — Assessment & Plan Note (Signed)
Well controlled, no changes to meds. Encouraged heart healthy diet such as the DASH diet and exercise as tolerated.  °

## 2020-12-14 NOTE — Progress Notes (Signed)
Subjective:   By signing my name below, I, April Jackson, attest that this documentation has been prepared under the direction and in the presence of [name], MD. 12/14/2020   Patient ID: April Jackson, female    DOB: 01-16-1976, 45 y.o.   MRN: [id number]  Chief Complaint  Patient presents with   Medical Management of Chronic Issues    6 m f/u -  Talk her about cycle     HPI Patient is in today for an office visit and 6 month f/u.  She reports that 2 weeks after her last cycle on 09/26, she started spotting and it has not resolved. She denies abdominal cramps, fever and dysuria associated with the spotting.   She received her flu vaccine today. She has 3 Covid-19 vaccines at this time and is willing to schedule the 2nd booster vaccine.  Past Medical History:  Diagnosis Date   Allergy    childhood allergies, hives.    Anemia 04/15/2016   Anxiety and depression 09/21/2018   Back pain 09/28/2014   Back pain affecting pregnancy, antepartum 09/28/2014   Cervical cancer screening 01/27/2015   Menarche at 11 Regular and moderate flow No history of abnormal pap in past No sexual activity G0P0, s/p Nohistory of abnormal MGM No concerns today No gyn surgeries   FH: breast cancer 09/28/2014   Hyperlipidemia, mixed 09/28/2014   Hypertension    Pruritus 09/28/2014   Reactive airway disease 04/15/2016   Seasonal allergies    SVT (supraventricular tachycardia) ([place])    Thyroid disease     No past surgical history on file.  Family History  Problem Relation Age of Onset   Heart disease Father    Pulmonary fibrosis Father        pneumonia   Cancer Sister 63       cervical cancer   Hypertension Sister    Hyperlipidemia Sister    Gout Sister    Diabetes Mother    Hypertension Mother    Hyperlipidemia Mother    Cancer Mother        cervical cancer   Diabetes Maternal Grandmother    Stroke Paternal Grandmother    Heart disease Paternal Grandfather        MI   Hypertension Sister     Cholelithiasis Sister    Thyroid disease Sister    Ovarian cysts Sister    Cancer Sister        breast   Asthma Sister 35    Social History   Socioeconomic History   Marital status: Single    Spouse name: Not on file   Number of children: 0   Years of education: Not on file   Highest education level: Not on file  Occupational History   Occupation: DENTIST    Employer: SELF EMPLOYED  Tobacco Use   Smoking status: Never   Smokeless tobacco: Never  Substance and Sexual Activity   Alcohol use: No   Drug use: No   Sexual activity: Not on file    Comment: Dentist, 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 120 tablet 0   ALPRAZolam (XANAX) 0.25 MG tablet Take 1 tablet (0.25 mg total) by mouth 2 (two) times daily as needed for anxiety. 20 tablet 1   aspirin 325 MG tablet Take 325 mg by mouth daily.     atenolol (TENORMIN) 25 MG tablet Take 1 tablet (25 mg total) by mouth daily. 30 tablet 3   budesonide (RHINOCORT AQUA) 32 MCG/ACT nasal spray Place 2 sprays into both nostrils daily. 8.6 g 2   diltiazem (CARDIZEM) 30 MG tablet TAKE 1 TABLET (30 MG TOTAL) BY MOUTH 2 (TWO) TIMES DAILY. ADDITIONALLY, TAKE 1 TABLET BY MOUTH EVERY FOUR TO SIX HOURS AS NEEDED FOR SUSTAINED HEART RATES GREATER THAN 100 BPM. 120 tablet 0   hydrochlorothiazide (HYDRODIURIL) 12.5 MG tablet Take 12.5 mg by mouth daily.     hydrocortisone 2.5 % ointment Apply topically 2 (two) times daily.      ketoconazole (NIZORAL) 2 % shampoo Apply 1 application topically 2 (two) times a week. 120 mL 1   levothyroxine (SYNTHROID) 25 MCG tablet Take 1 tablet (25 mcg total) by mouth daily before breakfast. 90 tablet 1   meloxicam (MOBIC) 15 MG tablet Take 1 tablet (15 mg total) by mouth daily as needed for pain. 30 tablet 2   methocarbamol (ROBAXIN) 500 MG tablet Take 1 tablet (500 mg total) by mouth every 8 (eight) hours as needed. 90 tablet 2   mometasone (ELOCON) 0.1 % cream Apply topically daily. 50 g 1   montelukast (SINGULAIR) 10 MG tablet TAKE 1 TABLET BY MOUTH EVERYDAY AT BEDTIME 90 tablet 1   omeprazole (PRILOSEC) 40 MG capsule TAKE 1 CAPSULE (40 MG TOTAL) BY MOUTH DAILY. 90 capsule 1   triamcinolone cream (KENALOG) 0.1 % Apply topically 2 (two) times daily. Apply topically 30 g 3   XOPENEX HFA 45 MCG/ACT inhaler Inhale 1-2 puffs into the lungs every 6 (six) hours as needed for wheezing. 1 each 12   diclofenac sodium (VOLTAREN) 1 % GEL Apply 4 g topically 4 (four) times daily. 3 Tube 1   hydrochlorothiazide (HYDRODIURIL) 12.5 MG tablet Take 1 tablet (12.5 mg total) by mouth daily. 90 tablet 3   hydrochlorothiazide (MICROZIDE) 12.5 MG capsule Take 1 capsule (12.5 mg total) by mouth daily. 30 capsule 0   nitroGLYCERIN (NITRODUR - DOSED IN MG/24 HR) 0.2 mg/hr patch Apply 1/4th patch to affected shoulder, change daily 30 patch 1   No facility-administered medications prior to visit.    Allergies  Allergen Reactions   Amoxicillin Other ([place] Comments)    Rash   Nitrous Oxide Other ([place] Comments)    Paradoxical response-got very hyper   Penicillins Other ([place] Comments)    Rash   Sodium Chloride Swelling    And increased BP    Review of Systems  Constitutional:  Negative for fever and malaise/fatigue.  HENT:  Negative for congestion.   Eyes:  Negative for redness.  Respiratory:  Negative for shortness of breath.   Cardiovascular:  Negative for chest pain, palpitations and leg swelling.   Gastrointestinal:  Negative for abdominal pain, blood in stool and nausea.  Genitourinary:  Negative for dysuria and frequency.  Musculoskeletal:  Negative for falls.  Skin:  Negative for rash.  Neurological:  Negative for dizziness, loss of consciousness and headaches.  Endo/Heme/Allergies:  Negative for polydipsia.  Psychiatric/Behavioral:  Negative for depression. The patient is not nervous/anxious.       Objective:    Physical Exam Constitutional:      General: She is not in acute distress.    Appearance: She is well-developed.  HENT:     Head: Normocephalic and atraumatic.  Eyes:     Conjunctiva/sclera: Conjunctivae normal.  Neck:     Thyroid: No thyromegaly.  Cardiovascular:     Rate and Rhythm: Normal rate and regular rhythm.     Heart sounds: Normal heart sounds. No murmur heard. Pulmonary:     Effort: Pulmonary effort is normal. No respiratory distress.     Breath sounds: Normal breath sounds.  Abdominal:     General: Bowel sounds are normal. There is no distension.     Palpations: Abdomen is soft. There is no mass.     Tenderness: There is no abdominal tenderness.  Musculoskeletal:     Cervical back: Neck supple.  Lymphadenopathy:     Cervical: No cervical adenopathy.  Skin:    General: Skin is warm and dry.  Neurological:     Mental Status: She is alert and oriented to person, place, and time.  Psychiatric:        Behavior: Behavior normal.    BP 100/60   Pulse 65   Temp 97.9 F (36.6 C) (Oral)   Ht 5' (1.524 m)   Wt 127 lb 12.8 oz (58 kg)   LMP 11/23/2020   SpO2 98%   BMI 24.96 kg/m  Wt Readings from Last 3 Encounters:  12/14/20 127 lb 12.8 oz (58 kg)  06/04/20 126 lb 6.4 oz (57.3 kg)  12/05/19 131 lb 12.8 oz (59.8 kg)    Diabetic Foot Exam - Simple   No data filed    Lab Results  Component Value Date   WBC 5.3 06/04/2020   HGB 12.2 06/04/2020   HCT 36.0 06/04/2020   PLT 283.0 06/04/2020   GLUCOSE 87 06/04/2020   CHOL 191 06/04/2020    TRIG 62.0 06/04/2020   HDL 68.40 06/04/2020   LDLCALC 110 (H) 06/04/2020   ALT 9 06/04/2020   AST 16 06/04/2020   NA 138 06/04/2020   K 3.9 06/04/2020   CL 99 06/04/2020   CREATININE 0.60 06/04/2020   BUN 9 06/04/2020   CO2 31 06/04/2020   TSH 3.44 06/04/2020    Lab Results  Component Value Date   TSH 3.44 06/04/2020   Lab Results  Component Value Date   WBC 5.3 06/04/2020   HGB 12.2 06/04/2020   HCT 36.0 06/04/2020   MCV 91.6 06/04/2020   PLT 283.0 06/04/2020   Lab Results  Component Value Date   NA 138 06/04/2020   K 3.9 06/04/2020   CO2 31 06/04/2020   GLUCOSE 87 06/04/2020   BUN 9 06/04/2020   CREATININE 0.60 06/04/2020   BILITOT 0.5 06/04/2020   ALKPHOS 36 (L) 06/04/2020   AST 16 06/04/2020   ALT 9 06/04/2020   PROT 7.5 06/04/2020   ALBUMIN 4.5 06/04/2020   CALCIUM 9.4 06/04/2020   ANIONGAP 10 09/08/2015   GFR 107.57 06/04/2020   Lab Results  Component Value Date   CHOL 191 06/04/2020   Lab Results  Component Value Date   HDL 68.40 06/04/2020   Lab Results  Component Value Date   LDLCALC 110 (H) 06/04/2020   Lab Results  Component Value Date   TRIG 62.0 06/04/2020   Lab Results  Component Value Date   CHOLHDL 3  06/04/2020   No results found for: HGBA1C     Assessment & Plan:   Problem List Items Addressed This Visit     Thyroid disease    Continue to monitor      Relevant Orders   TSH   Hyperlipidemia, mixed    Encourage heart healthy diet such as MIND or DASH diet, increase exercise, avoid trans fats, simple carbohydrates and processed foods, consider a krill or fish or flaxseed oil cap daily.       Relevant Medications   hydrochlorothiazide (HYDRODIURIL) 12.5 MG tablet   Other Relevant Orders   Lipid panel   Hypertension    Well controlled, no changes to meds. Encouraged heart healthy diet such as the DASH diet and exercise as tolerated.       Relevant Medications   hydrochlorothiazide (HYDRODIURIL) 12.5 MG tablet    Other Relevant Orders   Comprehensive metabolic panel   Lipid panel   Macrocytic anemia    Increase leafy greens, consider increased lean red meat and using cast iron cookware. Continue to monitor, report any concerns      Relevant Orders   CBC   RESOLVED: Pruritus   Other Visit Diagnoses     Need for immunization against influenza    -  Primary   Relevant Orders   Flu Vaccine QUAD 44moIM (Fluarix, Fluzone & Alfiuria Quad PF) (Completed)       No orders of the defined types were placed in this encounter.   I,April Jackson,acting as a sEducation administratorfor SPenni Homans MD.,have documented all relevant documentation on the behalf of SPenni Homans MD,as directed by  SPenni Homans MD while in the presence of SPenni Homans MD.   I, SMosie Lukes MD. , personally preformed the services described in this documentation.  All medical record entries made by the scribe were at my direction and in my presence.  I have reviewed the chart and discharge instructions (if applicable) and agree that the record reflects my personal performance and is accurate and complete. 12/14/2020

## 2020-12-14 NOTE — Assessment & Plan Note (Signed)
Continue to monitor

## 2020-12-15 LAB — COMPREHENSIVE METABOLIC PANEL
ALT: 24 U/L (ref 0–35)
AST: 30 U/L (ref 0–37)
Albumin: 4.2 g/dL (ref 3.5–5.2)
Alkaline Phosphatase: 51 U/L (ref 39–117)
BUN: 8 mg/dL (ref 6–23)
CO2: 31 mEq/L (ref 19–32)
Calcium: 9 mg/dL (ref 8.4–10.5)
Chloride: 101 mEq/L (ref 96–112)
Creatinine, Ser: 0.54 mg/dL (ref 0.40–1.20)
GFR: 109.93 mL/min (ref >60.00)
Glucose, Bld: 94 mg/dL (ref 70–99)
Potassium: 4 mEq/L (ref 3.5–5.1)
Sodium: 138 mEq/L (ref 135–145)
Total Bilirubin: 0.3 mg/dL (ref 0.2–1.2)
Total Protein: 7.1 g/dL (ref 6.0–8.3)

## 2020-12-15 LAB — CBC
HCT: 34.6 % — ABNORMAL LOW (ref 36.0–46.0)
Hemoglobin: 11.4 g/dL — ABNORMAL LOW (ref 12.0–15.0)
MCHC: 33 g/dL (ref 30.0–36.0)
MCV: 93.9 fl (ref 78.0–100.0)
Platelets: 293 10*3/uL (ref 150.0–400.0)
RBC: 3.69 Mil/uL — ABNORMAL LOW (ref 3.87–5.11)
RDW: 12.2 % (ref 11.5–15.5)
WBC: 5.2 10*3/uL (ref 4.0–10.5)

## 2020-12-15 LAB — LIPID PANEL
Cholesterol: 189 mg/dL (ref 0–200)
HDL: 66.6 mg/dL (ref >39.00)
LDL Cholesterol: 109 mg/dL — ABNORMAL HIGH (ref 0–99)
NonHDL: 122.31
Total CHOL/HDL Ratio: 3
Triglycerides: 68 mg/dL (ref 0.0–149.0)
VLDL: 13.6 mg/dL (ref 0.0–40.0)

## 2020-12-15 LAB — TSH: TSH: 3.68 u[IU]/mL (ref 0.35–5.50)

## 2020-12-31 DIAGNOSIS — M542 Cervicalgia: Secondary | ICD-10-CM | POA: Diagnosis not present

## 2021-01-10 ENCOUNTER — Encounter: Payer: Self-pay | Admitting: Family Medicine

## 2021-01-11 ENCOUNTER — Other Ambulatory Visit: Payer: Self-pay | Admitting: Family Medicine

## 2021-01-11 MED ORDER — MECLIZINE HCL 25 MG PO TABS
25.0000 mg | ORAL_TABLET | Freq: Three times a day (TID) | ORAL | 0 refills | Status: DC | PRN
Start: 2021-01-11 — End: 2022-05-09

## 2021-01-11 NOTE — Telephone Encounter (Signed)
Spoke with patient and she declines any chest pains, has some SOB but goes away.  She does not think the SOB is coming from this cause she uses her Xopenex and it goes away.  She will call if symptoms persists.

## 2021-01-15 DIAGNOSIS — L308 Other specified dermatitis: Secondary | ICD-10-CM | POA: Diagnosis not present

## 2021-01-27 ENCOUNTER — Other Ambulatory Visit: Payer: Self-pay | Admitting: Family Medicine

## 2021-01-30 DIAGNOSIS — M542 Cervicalgia: Secondary | ICD-10-CM | POA: Diagnosis not present

## 2021-02-12 ENCOUNTER — Other Ambulatory Visit: Payer: Self-pay | Admitting: Family Medicine

## 2021-02-12 DIAGNOSIS — I10 Essential (primary) hypertension: Secondary | ICD-10-CM

## 2021-02-12 DIAGNOSIS — E782 Mixed hyperlipidemia: Secondary | ICD-10-CM

## 2021-03-02 DIAGNOSIS — R531 Weakness: Secondary | ICD-10-CM | POA: Diagnosis not present

## 2021-03-02 DIAGNOSIS — R2689 Other abnormalities of gait and mobility: Secondary | ICD-10-CM | POA: Diagnosis not present

## 2021-03-02 DIAGNOSIS — M5459 Other low back pain: Secondary | ICD-10-CM | POA: Diagnosis not present

## 2021-03-02 DIAGNOSIS — M542 Cervicalgia: Secondary | ICD-10-CM | POA: Diagnosis not present

## 2021-03-04 ENCOUNTER — Other Ambulatory Visit: Payer: Self-pay | Admitting: Family Medicine

## 2021-03-04 DIAGNOSIS — I10 Essential (primary) hypertension: Secondary | ICD-10-CM

## 2021-03-04 DIAGNOSIS — E782 Mixed hyperlipidemia: Secondary | ICD-10-CM

## 2021-04-02 DIAGNOSIS — M5459 Other low back pain: Secondary | ICD-10-CM | POA: Diagnosis not present

## 2021-04-02 DIAGNOSIS — M542 Cervicalgia: Secondary | ICD-10-CM | POA: Diagnosis not present

## 2021-04-02 DIAGNOSIS — R531 Weakness: Secondary | ICD-10-CM | POA: Diagnosis not present

## 2021-04-02 DIAGNOSIS — R2689 Other abnormalities of gait and mobility: Secondary | ICD-10-CM | POA: Diagnosis not present

## 2021-04-30 DIAGNOSIS — M542 Cervicalgia: Secondary | ICD-10-CM | POA: Diagnosis not present

## 2021-04-30 DIAGNOSIS — M5459 Other low back pain: Secondary | ICD-10-CM | POA: Diagnosis not present

## 2021-04-30 DIAGNOSIS — R531 Weakness: Secondary | ICD-10-CM | POA: Diagnosis not present

## 2021-04-30 DIAGNOSIS — R2689 Other abnormalities of gait and mobility: Secondary | ICD-10-CM | POA: Diagnosis not present

## 2021-05-05 NOTE — Progress Notes (Signed)
? ?Subjective:  ? ? Patient ID: April Jackson, female    DOB: Aug 27, 1975, 46 y.o.   MRN: [id number] ? ?Chief Complaint  ?Patient presents with  ? Annual Exam  ? ? ?HPI ?Patient is in today for her annual physical exam. No recent febrile illness or hospitalizations. No acute concerns. She tries to stay active and maintain a heart healthy diet. Denies CP/palp/SOB/HA/congestion/fevers/GI or GU c/o. Taking meds as prescribed  ? ?Past Medical History:  ?Diagnosis Date  ? Allergy   ? childhood allergies, hives.   ? Anemia 04/15/2016  ? Anxiety and depression 09/21/2018  ? Back pain 09/28/2014  ? Back pain affecting pregnancy, antepartum 09/28/2014  ? Cervical cancer screening 01/27/2015  ? Menarche at 11 Regular and moderate flow No history of abnormal pap in past No sexual activity G0P0, s/p Nohistory of abnormal MGM No concerns today No gyn surgeries  ? FH: breast cancer 09/28/2014  ? Hyperlipidemia, mixed 09/28/2014  ? Hypertension   ? Pruritus 09/28/2014  ? Reactive airway disease 04/15/2016  ? Seasonal allergies   ? SVT (supraventricular tachycardia) ([place])   ? Thyroid disease   ? ? ?History reviewed. No pertinent surgical history. ? ?Family History  ?Problem Relation Age of Onset  ? Heart disease Father   ? Pulmonary fibrosis Father   ?     pneumonia  ? Cancer Sister 72  ?     cervical cancer  ? Hypertension Sister   ? Hyperlipidemia Sister   ? Gout Sister   ? Diabetes Mother   ? Hypertension Mother   ? Hyperlipidemia Mother   ? Cancer Mother   ?     cervical cancer  ? Diabetes Maternal Grandmother   ? Stroke Paternal Grandmother   ? Heart disease Paternal Grandfather   ?     MI  ? Hypertension Sister   ? Cholelithiasis Sister   ? Thyroid disease Sister   ? Ovarian cysts Sister   ? Cancer Sister   ?     breast  ? Asthma Sister 73  ? ? ?Social History  ? ?Socioeconomic History  ? Marital status: Single  ?  Spouse name: Not on file  ? Number of children: 0  ? Years of education: Not on file  ? Highest education level: Not on file   ?Occupational History  ? 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 Smoking status: Never  ? Smokeless tobacco: Never  ?Substance and Sexual Activity  ? Alcohol use: No  ? Drug use: No  ? Sexual activity: Not on file  ?  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 Not on file  ?Social History Narrative  ? Not on file  ? ?Social Determinants of Health  ? ?Financial Resource Strain: Not on file  ?Food Insecurity: Not on file  ?Transportation Needs: Not on file  ?Physical Activity: Not on file  ?Stress: Not on file  ?Social Connections: Not on file  ?Intimate Partner Violence: Not on file  ? ? ?Outpatient Medications Prior to Visit  ?Medication Sig Dispense Refill  ? ACETAMINOPHEN-BUTALBITAL 50-325 MG TABS Take 1 tablet by mouth 3 (three) times daily as needed. 120 tablet 0  ? ALPRAZolam (XANAX) 0.25 MG tablet Take 1 tablet (0.25 mg total) by mouth 2 (two) times daily as needed for anxiety. 20 tablet 1  ? aspirin 325 MG tablet Take 325 mg by mouth daily.    ?  atenolol (TENORMIN) 25 MG tablet Take 1 tablet (25 mg total) by mouth daily. 30 tablet 3  ? budesonide (RHINOCORT AQUA) 32 MCG/ACT nasal spray Place 2 sprays into both nostrils daily. 8.6 g 2  ? diltiazem (CARDIZEM) 30 MG tablet TAKE 1 TABLET 2 TIMES DAILY MAY TAKE 1 TAB EVERY 4-6 HRS AS NEEDED FOR SUSTAINED HR >100 BPM. 240 tablet 1  ? hydrochlorothiazide (HYDRODIURIL) 12.5 MG tablet Take 12.5 mg by mouth daily.    ? hydrocortisone 2.5 % ointment Apply topically 2 (two) times daily.    ? ketoconazole (NIZORAL) 2 % shampoo Apply 1 application topically 2 (two) times a week. 120 mL 1  ? levothyroxine (SYNTHROID) 25 MCG tablet Take 1 tablet (25 mcg total) by mouth daily before breakfast. 90 tablet 1  ? meclizine (ANTIVERT) 25 MG tablet Take 1 tablet (25 mg total) by mouth 3 (three) times daily as needed for dizziness. 30 tablet 0  ? meloxicam (MOBIC) 15 MG tablet Take 1 tablet (15 mg total) by mouth  daily as needed for pain. 30 tablet 2  ? methocarbamol (ROBAXIN) 500 MG tablet Take 1 tablet (500 mg total) by mouth every 8 (eight) hours as needed. 90 tablet 2  ? mometasone (ELOCON) 0.1 % cream Apply topically daily. 50 g 1  ? montelukast (SINGULAIR) 10 MG tablet TAKE 1 TABLET BY MOUTH EVERYDAY AT BEDTIME 90 tablet 1  ? omeprazole (PRILOSEC) 40 MG capsule TAKE 1 CAPSULE (40 MG TOTAL) BY MOUTH DAILY. 90 capsule 1  ? triamcinolone cream (KENALOG) 0.1 % APPLY TOPICALLY 2 (TWO) TIMES DAILY. APPLY TOPICALLY 30 g 3  ? XOPENEX HFA 45 MCG/ACT inhaler Inhale 1-2 puffs into the lungs every 6 (six) hours as needed for wheezing. 1 each 12  ? ?No facility-administered medications prior to visit.  ? ? ?Allergies  ?Allergen Reactions  ? Amoxicillin Other ([place] Comments)  ?  Rash  ? Nitrous Oxide Other ([place] Comments)  ?  Paradoxical response-got very hyper  ? Penicillins Other ([place] Comments)  ?  Rash  ? Sodium Chloride Swelling  ?  And increased BP  ? ? ?Review of Systems  ?Constitutional:  Negative for chills, fever and malaise/fatigue.  ?HENT:  Negative for congestion and hearing loss.   ?Eyes:  Negative for discharge.  ?Respiratory:  Negative for cough, sputum production and shortness of breath.   ?Cardiovascular:  Negative for chest pain, palpitations and leg swelling.  ?Gastrointestinal:  Negative for abdominal pain, blood in stool, constipation, diarrhea, heartburn, nausea and vomiting.  ?Genitourinary:  Negative for dysuria, frequency, hematuria and urgency.  ?Musculoskeletal:  Negative for back pain, falls and myalgias.  ?Skin:  Negative for rash.  ?Neurological:  Negative for dizziness, sensory change, loss of consciousness, weakness and headaches.  ?Endo/Heme/Allergies:  Negative for environmental allergies. Does not bruise/bleed easily.  ?Psychiatric/Behavioral:  Negative for depression and suicidal ideas. The patient is not nervous/anxious and does not have insomnia.   ? ?   ?Objective:  ?  ?Physical  Exam ?Constitutional:   ?   General: She is not in acute distress. ?   Appearance: She is well-developed.  ?HENT:  ?   Head: Normocephalic and atraumatic.  ?Eyes:  ?   Conjunctiva/sclera: Conjunctivae normal.  ?Neck:  ?   Thyroid: No thyromegaly.  ?Cardiovascular:  ?   Rate and Rhythm: Normal rate and regular rhythm.  ?   Heart sounds: Normal heart sounds. No murmur heard. ?Pulmonary:  ?   Effort: Pulmonary effort is normal. No  respiratory distress.  ?   Breath sounds: Normal breath sounds.  ?Abdominal:  ?   General: Bowel sounds are normal. There is no distension.  ?   Palpations: Abdomen is soft. There is no mass.  ?   Tenderness: There is no abdominal tenderness.  ?Musculoskeletal:  ?   Cervical back: Neck supple.  ?Lymphadenopathy:  ?   Cervical: No cervical adenopathy.  ?Skin: ?   General: Skin is warm and dry.  ?Neurological:  ?   Mental Status: She is alert and oriented to person, place, and time.  ?Psychiatric:     ?   Behavior: Behavior normal.  ? ? ?BP 120/70 (BP Location: Left Arm, Patient Position: Sitting, Cuff Size: Normal)   Pulse 65   Resp 20   Ht 5' (1.524 m)   Wt 125 lb 3.2 oz (56.8 kg)   SpO2 98%   BMI 24.45 kg/m?  ?Wt Readings from Last 3 Encounters:  ?05/06/21 125 lb 3.2 oz (56.8 kg)  ?12/14/20 127 lb 12.8 oz (58 kg)  ?06/04/20 126 lb 6.4 oz (57.3 kg)  ? ? ?Diabetic Foot Exam - Simple   ?No data filed ?  ? ?Lab Results  ?Component Value Date  ? WBC 5.2 05/06/2021  ? HGB 12.4 05/06/2021  ? HCT 36.6 05/06/2021  ? PLT 269.0 05/06/2021  ? GLUCOSE 87 05/06/2021  ? CHOL 206 (H) 05/06/2021  ? TRIG 131.0 05/06/2021  ? HDL 78.10 05/06/2021  ? LDLCALC 102 (H) 05/06/2021  ? ALT 8 05/06/2021  ? AST 16 05/06/2021  ? NA 140 05/06/2021  ? K 4.2 05/06/2021  ? CL 101 05/06/2021  ? CREATININE 0.58 05/06/2021  ? BUN 7 05/06/2021  ? CO2 34 (H) 05/06/2021  ? TSH 2.47 05/06/2021  ? ? ?Lab Results  ?Component Value Date  ? TSH 2.47 05/06/2021  ? ?Lab Results  ?Component Value Date  ? WBC 5.2 05/06/2021  ? HGB  12.4 05/06/2021  ? HCT 36.6 05/06/2021  ? MCV 92.1 05/06/2021  ? PLT 269.0 05/06/2021  ? ?Lab Results  ?Component Value Date  ? NA 140 05/06/2021  ? K 4.2 05/06/2021  ? CO2 34 (H) 05/06/2021  ? GLUCOSE 87 05/06/2021  ? BUN 7 03/2

## 2021-05-06 ENCOUNTER — Encounter: Payer: Self-pay | Admitting: Family Medicine

## 2021-05-06 ENCOUNTER — Ambulatory Visit (INDEPENDENT_AMBULATORY_CARE_PROVIDER_SITE_OTHER): Payer: BC Managed Care – PPO | Admitting: Family Medicine

## 2021-05-06 ENCOUNTER — Telehealth: Payer: Self-pay

## 2021-05-06 VITALS — BP 120/70 | HR 65 | Resp 20 | Ht 60.0 in | Wt 125.2 lb

## 2021-05-06 DIAGNOSIS — Z1231 Encounter for screening mammogram for malignant neoplasm of breast: Secondary | ICD-10-CM

## 2021-05-06 DIAGNOSIS — E782 Mixed hyperlipidemia: Secondary | ICD-10-CM | POA: Diagnosis not present

## 2021-05-06 DIAGNOSIS — E079 Disorder of thyroid, unspecified: Secondary | ICD-10-CM | POA: Diagnosis not present

## 2021-05-06 DIAGNOSIS — Z0000 Encounter for general adult medical examination without abnormal findings: Secondary | ICD-10-CM

## 2021-05-06 DIAGNOSIS — Z1211 Encounter for screening for malignant neoplasm of colon: Secondary | ICD-10-CM | POA: Diagnosis not present

## 2021-05-06 DIAGNOSIS — I10 Essential (primary) hypertension: Secondary | ICD-10-CM

## 2021-05-06 DIAGNOSIS — Z124 Encounter for screening for malignant neoplasm of cervix: Secondary | ICD-10-CM

## 2021-05-06 DIAGNOSIS — N926 Irregular menstruation, unspecified: Secondary | ICD-10-CM

## 2021-05-06 DIAGNOSIS — J4520 Mild intermittent asthma, uncomplicated: Secondary | ICD-10-CM

## 2021-05-06 LAB — CBC
HCT: 36.6 % (ref 36.0–46.0)
Hemoglobin: 12.4 g/dL (ref 12.0–15.0)
MCHC: 33.8 g/dL (ref 30.0–36.0)
MCV: 92.1 fl (ref 78.0–100.0)
Platelets: 269 10*3/uL (ref 150.0–400.0)
RBC: 3.97 Mil/uL (ref 3.87–5.11)
RDW: 12.2 % (ref 11.5–15.5)
WBC: 5.2 10*3/uL (ref 4.0–10.5)

## 2021-05-06 LAB — LIPID PANEL
Cholesterol: 206 mg/dL — ABNORMAL HIGH (ref 0–200)
HDL: 78.1 mg/dL (ref >39.00)
LDL Cholesterol: 102 mg/dL — ABNORMAL HIGH (ref 0–99)
NonHDL: 128.03
Total CHOL/HDL Ratio: 3
Triglycerides: 131 mg/dL (ref 0.0–149.0)
VLDL: 26.2 mg/dL (ref 0.0–40.0)

## 2021-05-06 LAB — COMPREHENSIVE METABOLIC PANEL
ALT: 8 U/L (ref 0–35)
AST: 16 U/L (ref 0–37)
Albumin: 4.7 g/dL (ref 3.5–5.2)
Alkaline Phosphatase: 40 U/L (ref 39–117)
BUN: 7 mg/dL (ref 6–23)
CO2: 34 mEq/L — ABNORMAL HIGH (ref 19–32)
Calcium: 9.3 mg/dL (ref 8.4–10.5)
Chloride: 101 mEq/L (ref 96–112)
Creatinine, Ser: 0.58 mg/dL (ref 0.40–1.20)
GFR: 107.76 mL/min (ref >60.00)
Glucose, Bld: 87 mg/dL (ref 70–99)
Potassium: 4.2 mEq/L (ref 3.5–5.1)
Sodium: 140 mEq/L (ref 135–145)
Total Bilirubin: 0.4 mg/dL (ref 0.2–1.2)
Total Protein: 7.4 g/dL (ref 6.0–8.3)

## 2021-05-06 LAB — TSH: TSH: 2.47 u[IU]/mL (ref 0.35–5.50)

## 2021-05-06 NOTE — Telephone Encounter (Signed)
Pt states she is on her period and will do the pap another time. ?

## 2021-05-06 NOTE — Telephone Encounter (Signed)
Shagun Sweeny ?Caller Phone Number [phone] ?Patient Name April Jackson ?Patient DOB [birth year] ?Call Type Message Only Information Provided ?Reason for Call Request for General Office Information ?Initial Comment Caller states she is calling about her appointment tomorrow. She is unsure if she could do the ?physical or not ?

## 2021-05-06 NOTE — Patient Instructions (Signed)

## 2021-05-06 NOTE — Assessment & Plan Note (Signed)
Well controlled, no changes to meds. Encouraged heart healthy diet such as the DASH diet and exercise as tolerated.  °

## 2021-05-06 NOTE — Assessment & Plan Note (Signed)
Tolerating statin, encouraged heart healthy diet, avoid trans fats, minimize simple carbs and saturated fats. Increase exercise as tolerated 

## 2021-05-07 DIAGNOSIS — Z9889 Other specified postprocedural states: Secondary | ICD-10-CM | POA: Diagnosis not present

## 2021-05-07 DIAGNOSIS — E782 Mixed hyperlipidemia: Secondary | ICD-10-CM | POA: Diagnosis not present

## 2021-05-07 DIAGNOSIS — G4733 Obstructive sleep apnea (adult) (pediatric): Secondary | ICD-10-CM | POA: Diagnosis not present

## 2021-05-07 DIAGNOSIS — R002 Palpitations: Secondary | ICD-10-CM | POA: Diagnosis not present

## 2021-05-09 DIAGNOSIS — N926 Irregular menstruation, unspecified: Secondary | ICD-10-CM | POA: Insufficient documentation

## 2021-05-09 NOTE — Assessment & Plan Note (Signed)
More irregular over past year. Referred to OB/GYN for further evaluation ?

## 2021-05-09 NOTE — Assessment & Plan Note (Signed)
Patient encouraged to maintain heart healthy diet, regular exercise, adequate sleep. Consider daily probiotics. Take medications as prescribed. Labs ordered and reviewed. Referred to OB/GYN for pap, MGM ordered ?

## 2021-05-09 NOTE — Assessment & Plan Note (Signed)
No recent exacerbation 

## 2021-05-12 ENCOUNTER — Ambulatory Visit: Payer: BC Managed Care – PPO | Admitting: Family Medicine

## 2021-05-18 ENCOUNTER — Encounter (HOSPITAL_BASED_OUTPATIENT_CLINIC_OR_DEPARTMENT_OTHER): Payer: Self-pay

## 2021-05-18 ENCOUNTER — Ambulatory Visit (HOSPITAL_BASED_OUTPATIENT_CLINIC_OR_DEPARTMENT_OTHER)
Admission: RE | Admit: 2021-05-18 | Discharge: 2021-05-18 | Disposition: A | Discharge status: Home / Self Care | Payer: BC Managed Care – PPO | Source: Ambulatory Visit | Attending: Family Medicine | Admitting: Family Medicine

## 2021-05-18 DIAGNOSIS — Z1231 Encounter for screening mammogram for malignant neoplasm of breast: Secondary | ICD-10-CM | POA: Diagnosis not present

## 2021-05-26 ENCOUNTER — Encounter: Payer: Self-pay | Admitting: Family Medicine

## 2021-05-26 LAB — COLOGUARD

## 2021-05-27 ENCOUNTER — Ambulatory Visit: Payer: BC Managed Care – PPO | Admitting: Obstetrics and Gynecology

## 2021-05-27 ENCOUNTER — Encounter: Payer: Self-pay | Admitting: Obstetrics and Gynecology

## 2021-05-27 ENCOUNTER — Other Ambulatory Visit: Payer: Self-pay

## 2021-05-27 ENCOUNTER — Other Ambulatory Visit (HOSPITAL_COMMUNITY)
Admission: RE | Admit: 2021-05-27 | Discharge: 2021-05-27 | Disposition: A | Discharge status: Home / Self Care | Payer: BC Managed Care – PPO | Source: Ambulatory Visit | Attending: Obstetrics and Gynecology | Admitting: Obstetrics and Gynecology

## 2021-05-27 VITALS — BP 123/76 | HR 72 | Ht 61.0 in | Wt 122.0 lb

## 2021-05-27 DIAGNOSIS — N939 Abnormal uterine and vaginal bleeding, unspecified: Secondary | ICD-10-CM | POA: Diagnosis not present

## 2021-05-27 DIAGNOSIS — Z124 Encounter for screening for malignant neoplasm of cervix: Secondary | ICD-10-CM

## 2021-05-27 DIAGNOSIS — Z01419 Encounter for gynecological examination (general) (routine) without abnormal findings: Secondary | ICD-10-CM | POA: Diagnosis not present

## 2021-05-27 DIAGNOSIS — Z1211 Encounter for screening for malignant neoplasm of colon: Secondary | ICD-10-CM

## 2021-05-27 NOTE — Progress Notes (Signed)
? ? ?GYNECOLOGY ANNUAL PREVENTATIVE CARE ENCOUNTER NOTE ? ?Subjective:  ? April Jackson is a 46 y.o. G0P0000 female here for a annual gynecologic exam. Current complaints: very light spotting x 3 weeks, then off for a week. Has been going on for about 3-4 months, previously had normal periods. Also had some hot flashes a few months ago.   ? ?Denies abnormal discharge, pelvic pain, problems with intercourse or other gynecologic concerns. Declines STI screen. ? ?She is not sexually active. ?  ?Gynecologic History ?Patient's last menstrual period was 05/14/2021 (approximate). ?Contraception: abstinence ?Last Pap: reports normal ?Last mammogram: 05/2021. Results: Birads 1 ?DEXA: has never had ? ?Obstetric History ?OB History  ?Gravida Para Term Preterm AB Living  ?0 0 0 0 0 0  ?SAB IAB Ectopic Multiple Live Births  ?0 0 0 0 0  ? ? ?Past Medical History:  ?Diagnosis Date  ? Allergy   ? childhood allergies, hives.   ? Anemia 04/15/2016  ? Anxiety and depression 09/21/2018  ? Back pain 09/28/2014  ? Back pain affecting pregnancy, antepartum 09/28/2014  ? Cervical cancer screening 01/27/2015  ? Menarche at 11 Regular and moderate flow No history of abnormal pap in past No sexual activity G0P0, s/p Nohistory of abnormal MGM No concerns today No gyn surgeries  ? FH: breast cancer 09/28/2014  ? Hyperlipidemia, mixed 09/28/2014  ? Hypertension   ? Pruritus 09/28/2014  ? Reactive airway disease 04/15/2016  ? Seasonal allergies   ? SVT (supraventricular tachycardia) ([place])   ? Thyroid disease   ? ? ?History reviewed. No pertinent surgical history. ? ?Current Outpatient Medications on File Prior to Visit  ?Medication Sig Dispense Refill  ? ACETAMINOPHEN-BUTALBITAL 50-325 MG TABS Take 1 tablet by mouth 3 (three) times daily as needed. 120 tablet 0  ? ALPRAZolam (XANAX) 0.25 MG tablet Take 1 tablet (0.25 mg total) by mouth 2 (two) times daily as needed for anxiety. 20 tablet 1  ? aspirin 325 MG tablet Take 325 mg by mouth daily.    ? atenolol  (TENORMIN) 25 MG tablet Take 1 tablet (25 mg total) by mouth daily. 30 tablet 3  ? budesonide (RHINOCORT AQUA) 32 MCG/ACT nasal spray Place 2 sprays into both nostrils daily. 8.6 g 2  ? diltiazem (CARDIZEM) 30 MG tablet TAKE 1 TABLET 2 TIMES DAILY MAY TAKE 1 TAB EVERY 4-6 HRS AS NEEDED FOR SUSTAINED HR >100 BPM. 240 tablet 1  ? hydrochlorothiazide (HYDRODIURIL) 12.5 MG tablet Take 12.5 mg by mouth daily.    ? hydrocortisone 2.5 % ointment Apply topically 2 (two) times daily.    ? ketoconazole (NIZORAL) 2 % shampoo Apply 1 application topically 2 (two) times a week. 120 mL 1  ? levothyroxine (SYNTHROID) 25 MCG tablet Take 1 tablet (25 mcg total) by mouth daily before breakfast. 90 tablet 1  ? meclizine (ANTIVERT) 25 MG tablet Take 1 tablet (25 mg total) by mouth 3 (three) times daily as needed for dizziness. 30 tablet 0  ? meloxicam (MOBIC) 15 MG tablet Take 1 tablet (15 mg total) by mouth daily as needed for pain. 30 tablet 2  ? methocarbamol (ROBAXIN) 500 MG tablet Take 1 tablet (500 mg total) by mouth every 8 (eight) hours as needed. 90 tablet 2  ? mometasone (ELOCON) 0.1 % cream Apply topically daily. 50 g 1  ? montelukast (SINGULAIR) 10 MG tablet TAKE 1 TABLET BY MOUTH EVERYDAY AT BEDTIME 90 tablet 1  ? omeprazole (PRILOSEC) 40 MG capsule TAKE 1 CAPSULE (  40 MG TOTAL) BY MOUTH DAILY. 90 capsule 1  ? triamcinolone cream (KENALOG) 0.1 % APPLY TOPICALLY 2 (TWO) TIMES DAILY. APPLY TOPICALLY 30 g 3  ? XOPENEX HFA 45 MCG/ACT inhaler Inhale 1-2 puffs into the lungs every 6 (six) hours as needed for wheezing. 1 each 12  ? ?No current facility-administered medications on file prior to visit.  ? ? ?Allergies  ?Allergen Reactions  ? Amoxicillin Other ([place] Comments)  ?  Rash  ? Nitrous Oxide Other ([place] Comments)  ?  Paradoxical response-got very hyper  ? Penicillins Other ([place] Comments)  ?  Rash  ? Sodium Chloride Swelling  ?  And increased BP  ? ? ?Social History  ? ?Socioeconomic History  ? Marital status: Single  ?   Spouse name: Not on file  ? Number of children: 0  ? Years of education: Not on file  ? Highest education level: Not on file  ?Occupational History  ? 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 Smoking status: Never  ? Smokeless tobacco: Never  ?Substance and Sexual Activity  ? Alcohol use: No  ? Drug use: No  ? Sexual activity: Never  ?  Birth control/protection: None  ?  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 Not on file  ?Social History Narrative  ? Not on file  ? ?Social Determinants of Health  ? ?Financial Resource Strain: Not on file  ?Food Insecurity: Not on file  ?Transportation Needs: Not on file  ?Physical Activity: Not on file  ?Stress: Not on file  ?Social Connections: Not on file  ?Intimate Partner Violence: Not on file  ? ? ?Family History  ?Problem Relation Age of Onset  ? Heart disease Father   ? Pulmonary fibrosis Father   ?     pneumonia  ? Cancer Sister 55  ?     cervical cancer  ? Hypertension Sister   ? Hyperlipidemia Sister   ? Gout Sister   ? Diabetes Mother   ? Hypertension Mother   ? Hyperlipidemia Mother   ? Cancer Mother   ?     cervical cancer  ? Diabetes Maternal Grandmother   ? Stroke Paternal Grandmother   ? Heart disease Paternal Grandfather   ?     MI  ? Hypertension Sister   ? Cholelithiasis Sister   ? Thyroid disease Sister   ? Ovarian cysts Sister   ? Cancer Sister   ?     breast  ? Asthma Sister 48  ? ?The following portions of the patient's history were reviewed and updated as appropriate: allergies, current medications, past family history, past medical history, past social history, past surgical history and problem list. ? ?Review of Systems ?Pertinent items are noted in HPI. ?  ?Objective:  ?BP 123/76   Pulse 72   Ht 5' 1"  (1.549 m)   Wt 122 lb (55.3 kg)   LMP 05/14/2021 (Approximate)   BMI 23.05 kg/m?  ?CONSTITUTIONAL: Well-developed, well-nourished female in no acute distress.  ?HENT:  Normocephalic,  atraumatic, External right and left ear normal. Oropharynx is clear and moist ?EYES: Conjunctivae and EOM are normal. Pupils are equal, round, and reactive to light. No scleral icterus.  ?NECK: Normal range of motion, supple, no masses.  Normal thyroid.  ?SKIN: Skin is warm and dry. No rash noted. Not diaphoretic. No erythema. No pallor. ?NEUROLOGIC: Alert and oriented to person, place, and time. Normal reflexes,  muscle tone coordination. No cranial nerve deficit noted. ?PSYCHIATRIC: Normal mood and affect. Normal behavior. Normal judgment and thought content. ?CARDIOVASCULAR: Normal heart rate noted ?RESPIRATORY: Effort normal, no problems with respiration noted. ?BREASTS: Symmetric in size. No masses, skin changes, nipple drainage, or lymphadenopathy. ?ABDOMEN: Soft, no distention noted.  No tenderness, rebound or guarding.  ?PELVIC: Normal appearing external genitalia; normal appearing vaginal mucosa, anterior lip of cervix visualized and normal appearing, posterior lip unable to be visualized due to patient discomfort with procedure. Semi-blind Pap smear obtained. Declined bimanual exam. ?MUSCULOSKELETAL: Normal range of motion. No tenderness.  No cyanosis, clubbing, or edema.  ? ?Exam done with chaperone present. ? ? ?Assessment and Plan:  ? ?1. Well woman exam with routine gynecological exam ?Healthy female exam ?- Cytology - PAP( [place]) ? ?2. Abnormal uterine bleeding (AUB) ?- likely 2/2 perimenopause however will obtain imaging to rule out malignancy vs polyp ?- reviewed if abnormal [place], would recommend EMB ?- can manage hormonally once other pathology ruled out ? ?3. Cervical cancer screening ?Pap today ? ? ?Will follow up results of pap smear and manage accordingly. ?Encouraged improvement in diet and exercise.  ?COVID vaccine UTD ?Declines STI screen. ?Mammogram UTD ?Referral for colonoscopy n/a, already seeing GI ?Flu vaccine n/a ?DEXA not due based on age ? ?Routine preventative health maintenance  measures emphasized. ?Please refer to After Visit Summary for other counseling recommendations.  ? ? ?K. Arvilla Meres, MD, FACOG ?Attending ?[facility] [employer] [facility]) ? ?

## 2021-05-31 DIAGNOSIS — M542 Cervicalgia: Secondary | ICD-10-CM | POA: Diagnosis not present

## 2021-05-31 DIAGNOSIS — R2689 Other abnormalities of gait and mobility: Secondary | ICD-10-CM | POA: Diagnosis not present

## 2021-05-31 DIAGNOSIS — M5459 Other low back pain: Secondary | ICD-10-CM | POA: Diagnosis not present

## 2021-05-31 DIAGNOSIS — R531 Weakness: Secondary | ICD-10-CM | POA: Diagnosis not present

## 2021-05-31 LAB — CYTOLOGY - PAP
Comment: NEGATIVE
Diagnosis: NEGATIVE
High risk HPV: NEGATIVE

## 2021-06-04 ENCOUNTER — Ambulatory Visit (INDEPENDENT_AMBULATORY_CARE_PROVIDER_SITE_OTHER): Payer: BC Managed Care – PPO

## 2021-06-04 DIAGNOSIS — N926 Irregular menstruation, unspecified: Secondary | ICD-10-CM | POA: Diagnosis not present

## 2021-06-04 DIAGNOSIS — N939 Abnormal uterine and vaginal bleeding, unspecified: Secondary | ICD-10-CM | POA: Diagnosis not present

## 2021-06-09 LAB — COLOGUARD

## 2021-06-24 ENCOUNTER — Encounter: Payer: Self-pay | Admitting: Obstetrics and Gynecology

## 2021-06-24 ENCOUNTER — Encounter: Payer: BC Managed Care – PPO | Admitting: Family Medicine

## 2021-06-24 ENCOUNTER — Ambulatory Visit: Payer: BC Managed Care – PPO | Admitting: Obstetrics and Gynecology

## 2021-06-24 VITALS — BP 108/70 | HR 66 | Ht 61.0 in | Wt 122.0 lb

## 2021-06-24 DIAGNOSIS — N939 Abnormal uterine and vaginal bleeding, unspecified: Secondary | ICD-10-CM

## 2021-06-24 MED ORDER — ALPRAZOLAM 0.25 MG PO TABS: 0.2500 mg | ORAL_TABLET | Freq: Every day | ORAL | 0 refills | Status: DC | PRN

## 2021-06-24 NOTE — Progress Notes (Signed)
? ?GYNECOLOGY OFFICE FOLLOW UP NOTE ? ?History:  ?46 y.o. G0P0000 here today for follow up for imaging results for intermittent vaginal spotting. Still having spotting, no regular periods. Has not changed since last seen.   ? ?Past Medical History:  ?Diagnosis Date  ? Allergy   ? childhood allergies, hives.   ? Anemia 04/15/2016  ? Anxiety and depression 09/21/2018  ? Back pain 09/28/2014  ? Back pain affecting pregnancy, antepartum 09/28/2014  ? Cervical cancer screening 01/27/2015  ? Menarche at 11 Regular and moderate flow No history of abnormal pap in past No sexual activity G0P0, s/p Nohistory of abnormal MGM No concerns today No gyn surgeries  ? FH: breast cancer 09/28/2014  ? Hyperlipidemia, mixed 09/28/2014  ? Hypertension   ? Pruritus 09/28/2014  ? Reactive airway disease 04/15/2016  ? Seasonal allergies   ? SVT (supraventricular tachycardia) ([place])   ? Thyroid disease   ? ? ?History reviewed. No pertinent surgical history. ? ? ?Current Outpatient Medications:  ?  ACETAMINOPHEN-BUTALBITAL 50-325 MG TABS, Take 1 tablet by mouth 3 (three) times daily as needed., Disp: 120 tablet, Rfl: 0 ?  ALPRAZolam (XANAX) 0.25 MG tablet, Take 1 tablet (0.25 mg total) by mouth 2 (two) times daily as needed for anxiety., Disp: 20 tablet, Rfl: 1 ?  ALPRAZolam (XANAX) 0.25 MG tablet, Take 1 tablet (0.25 mg total) by mouth daily as needed for anxiety. Do not take until after arrival to office., Disp: 2 tablet, Rfl: 0 ?  aspirin 325 MG tablet, Take 325 mg by mouth daily., Disp: , Rfl:  ?  atenolol (TENORMIN) 25 MG tablet, Take 1 tablet (25 mg total) by mouth daily., Disp: 30 tablet, Rfl: 3 ?  budesonide (RHINOCORT AQUA) 32 MCG/ACT nasal spray, Place 2 sprays into both nostrils daily., Disp: 8.6 g, Rfl: 2 ?  diltiazem (CARDIZEM) 30 MG tablet, TAKE 1 TABLET 2 TIMES DAILY MAY TAKE 1 TAB EVERY 4-6 HRS AS NEEDED FOR SUSTAINED HR >100 BPM., Disp: 240 tablet, Rfl: 1 ?  hydrochlorothiazide (HYDRODIURIL) 12.5 MG tablet, Take 12.5 mg by mouth  daily., Disp: , Rfl:  ?  hydrocortisone 2.5 % ointment, Apply topically 2 (two) times daily., Disp: , Rfl:  ?  ketoconazole (NIZORAL) 2 % shampoo, Apply 1 application topically 2 (two) times a week., Disp: 120 mL, Rfl: 1 ?  levothyroxine (SYNTHROID) 25 MCG tablet, Take 1 tablet (25 mcg total) by mouth daily before breakfast., Disp: 90 tablet, Rfl: 1 ?  meclizine (ANTIVERT) 25 MG tablet, Take 1 tablet (25 mg total) by mouth 3 (three) times daily as needed for dizziness., Disp: 30 tablet, Rfl: 0 ?  meloxicam (MOBIC) 15 MG tablet, Take 1 tablet (15 mg total) by mouth daily as needed for pain., Disp: 30 tablet, Rfl: 2 ?  methocarbamol (ROBAXIN) 500 MG tablet, Take 1 tablet (500 mg total) by mouth every 8 (eight) hours as needed., Disp: 90 tablet, Rfl: 2 ?  mometasone (ELOCON) 0.1 % cream, Apply topically daily., Disp: 50 g, Rfl: 1 ?  montelukast (SINGULAIR) 10 MG tablet, TAKE 1 TABLET BY MOUTH EVERYDAY AT BEDTIME, Disp: 90 tablet, Rfl: 1 ?  omeprazole (PRILOSEC) 40 MG capsule, TAKE 1 CAPSULE (40 MG TOTAL) BY MOUTH DAILY., Disp: 90 capsule, Rfl: 1 ?  triamcinolone cream (KENALOG) 0.1 %, APPLY TOPICALLY 2 (TWO) TIMES DAILY. APPLY TOPICALLY, Disp: 30 g, Rfl: 3 ?  XOPENEX HFA 45 MCG/ACT inhaler, Inhale 1-2 puffs into the lungs every 6 (six) hours as needed for wheezing., Disp:  1 each, Rfl: 12 ? ?The following portions of the patient's history were reviewed and updated as appropriate: allergies, current medications, past family history, past medical history, past social history, past surgical history and problem list.  ? ?Review of Systems:  ?Pertinent items noted in HPI and remainder of comprehensive ROS otherwise negative. ?  ?Objective:  ?Physical Exam ?BP 108/70   Pulse 66   Ht 5' 1"  (1.549 m)   Wt 122 lb (55.3 kg)   BMI 23.05 kg/m?  ?CONSTITUTIONAL: Well-developed, well-nourished female in no acute distress.  ?HENT:  Normocephalic, atraumatic. External right and left ear normal. Oropharynx is clear and moist ?EYES:  Conjunctivae and EOM are normal. Pupils are equal, round, and reactive to light. No scleral icterus.  ?NECK: Normal range of motion, supple, no masses ?SKIN: Skin is warm and dry. No rash noted. Not diaphoretic. No erythema. No pallor. ?NEUROLOGIC: Alert and oriented to person, place, and time. Normal reflexes, muscle tone coordination. No cranial nerve deficit noted. ?PSYCHIATRIC: Normal mood and affect. Normal behavior. Normal judgment and thought content. ?CARDIOVASCULAR: Normal heart rate noted ?RESPIRATORY: Effort normal, no problems with respiration noted ?ABDOMEN: Soft, no distention noted.   ?PELVIC: deferred ?MUSCULOSKELETAL: Normal range of motion. No edema noted. ? ?Labs and Imaging ?US PELVIS (TRANSABDOMINAL ONLY) ? ?Addendum Date: 06/09/2021   ?ADDENDUM REPORT: 06/09/2021 12:13 ADDENDUM: In the current study, color Doppler examination of pelvis was performed and not pulsed Doppler examination. Electronically Signed   By: Elmer Picker M.D.   On: 06/09/2021 12:13  ? ?Result Date: 06/09/2021 ?CLINICAL DATA:  Irregular menstrual periods EXAM: TRANSABDOMINAL ULTRASOUND OF PELVIS DOPPLER ULTRASOUND OF OVARIES TECHNIQUE: Transabdominal ultrasound examination of the pelvis was performed including evaluation of the uterus, ovaries, adnexal regions, and pelvic cul-de-sac. Color and duplex Doppler ultrasound was utilized to evaluate blood flow to the ovaries. COMPARISON:  None. FINDINGS: Uterus Measurements: 7.8 x 3.5 x 4.7 cm = volume: 67.1 mL. No fibroids or other mass visualized. Endometrium Thickness: 11 mm.  No focal abnormality visualized. Right ovary Measurements: 4.2 x 1.4 x 2.3 cm = volume: 7.1 mL. Small follicles are seen. Left ovary Measurements: 3.2 x 1.5 x 1.7 cm = volume: 4.2 mL. Small follicles are seen. Pulsed Doppler evaluation demonstrates normal low-resistance arterial and venous waveforms in both ovaries. Other: There is no free fluid. IMPRESSION: Endometrial stripe is prominent measuring  11 mm. There is no abnormal increased vascularity in the endometrium. Findings may suggest secretory phase of menstrual cycle or suggest endometrial hyperplasia. Follow-up pelvic sonogram in 1-2 months may be considered. No dominant adnexal masses are seen. There is no free fluid in the pelvis. Electronically Signed: By: Elmer Picker M.D. On: 06/04/2021 12:36   ? ?Assessment & Plan:  ?1. Abnormal uterine bleeding (AUB) ?- Reviewed [place], patient with endometrial stripe 11 mm at midpoint of cycle, reports period was 2 weeks prior to this ultrasound, which would put her at the beginning of secretory phase ?- 11 mm not unusual for secretory phase, however, given ongoing spotting, would recommend endometrial sampling to rule out malignancy ?- patient has significant discomfort with pelvic exam and I reviewed option of office endometrial biopsy vs D&C done under anesthesia, reviewed risks/benefits of both, she would like to try office EMB ?- given anxiety and discomfort with exam, patient requesting xanax, which she has taken before to good effect, will send Rx for low dose, which is what she has taken ?- will plan for liberal use of lidocaine spray ?- she  will need ride home from appt, which she verbalizes understanding of ?- return for EMB ? ? ?Routine preventative health maintenance measures emphasized. ?Please refer to After Visit Summary for other counseling recommendations.  ? ?Return in about 4 weeks (around 07/22/2021). ? ? ?K. Arvilla Meres, MD, FACOG ?Attending ?Center for [employer] [facility]) ? ? ? ?

## 2021-06-30 DIAGNOSIS — M5459 Other low back pain: Secondary | ICD-10-CM | POA: Diagnosis not present

## 2021-06-30 DIAGNOSIS — R2689 Other abnormalities of gait and mobility: Secondary | ICD-10-CM | POA: Diagnosis not present

## 2021-06-30 DIAGNOSIS — R531 Weakness: Secondary | ICD-10-CM | POA: Diagnosis not present

## 2021-06-30 DIAGNOSIS — M542 Cervicalgia: Secondary | ICD-10-CM | POA: Diagnosis not present

## 2021-07-02 DIAGNOSIS — Z1211 Encounter for screening for malignant neoplasm of colon: Secondary | ICD-10-CM | POA: Diagnosis not present

## 2021-07-11 LAB — COLOGUARD: COLOGUARD: NEGATIVE

## 2021-07-26 ENCOUNTER — Encounter: Payer: Self-pay | Admitting: Family Medicine

## 2021-07-26 ENCOUNTER — Other Ambulatory Visit: Payer: Self-pay | Admitting: Family Medicine

## 2021-07-26 NOTE — Progress Notes (Unsigned)
MyChart Video Visit    Virtual Visit via Video Note   This visit type was conducted due to national recommendations for restrictions regarding the COVID-19 Pandemic (e.g. social distancing) in an effort to limit this patient's exposure and mitigate transmission in our community. This patient is at least at moderate risk for complications without adequate follow up. This format is felt to be most appropriate for this patient at this time. Physical exam was limited by quality of the video and audio technology used for the visit. April Jackson., CMA was able to get the patient set up on a video visit.  Patient location: office Patient and provider in visit Provider location: Office  I discussed the limitations of evaluation and management by telemedicine and the availability of in person appointments. The patient expressed understanding and agreed to proceed.  Visit Date: 07/27/2021  Today's healthcare provider: Penni Homans, MD     Subjective:    Patient ID: April Jackson, female    DOB: Mar 30, 1975, 46 y.o.   MRN: [id number]  Chief Complaint  Patient presents with   Follow-up    HPI Patient is in today for a follow up on chronic medical concerns. No recent febrile illness or acute hospitalizations. She had a recent flare of her RAD but is doing better now. No acute concerns. Denies CP/palp/SOB/HA/congestion/fevers/GI or GU c/o. Taking meds as prescribed   Past Medical History:  Diagnosis Date   Allergy    childhood allergies, hives.    Anemia 04/15/2016   Anxiety and depression 09/21/2018   Back pain 09/28/2014   Back pain affecting pregnancy, antepartum 09/28/2014   Cervical cancer screening 01/27/2015   Menarche at 11 Regular and moderate flow No history of abnormal pap in past No sexual activity G0P0, s/p Nohistory of abnormal MGM No concerns today No gyn surgeries   FH: breast cancer 09/28/2014   Hyperlipidemia, mixed 09/28/2014   Hypertension    Pruritus 09/28/2014   Reactive  airway disease 04/15/2016   Seasonal allergies    SVT (supraventricular tachycardia) ([place])    Thyroid disease     History reviewed. No pertinent surgical history.  Family History  Problem Relation Age of Onset   Heart disease Father    Pulmonary fibrosis Father        pneumonia   Cancer Sister 13       cervical cancer   Hypertension Sister    Hyperlipidemia Sister    Gout Sister    Diabetes Mother    Hypertension Mother    Hyperlipidemia Mother    Cancer Mother        cervical cancer   Diabetes Maternal Grandmother    Stroke Paternal Grandmother    Heart disease Paternal Grandfather        MI   Hypertension Sister    Cholelithiasis Sister    Thyroid disease Sister    Ovarian cysts Sister    Cancer Sister        breast   Asthma Sister 32    Social History   Socioeconomic History   Marital status: Single    Spouse name: Not on file   Number of children: 0   Years of education: Not on file   Highest education level: Not on file  Occupational History   Occupation: DENTIST    Employer: SELF EMPLOYED  Tobacco Use   Smoking status: Never   Smokeless tobacco: Never  Substance and Sexual Activity   Alcohol use: No   Drug  use: No   Sexual activity: Never    Birth control/protection: None    Comment: Dentist, no dietary restriction, lives with sister  Other Topics Concern   Not on file  Social History Narrative   Not on file   Social Determinants of Health   Financial Resource Strain: Not on file  Food Insecurity: Not on file  Transportation Needs: Not on file  Physical Activity: Not on file  Stress: Not on file  Social Connections: Not on file  Intimate Partner Violence: Not on file    Outpatient Medications Prior to Visit  Medication Sig Dispense Refill   ACETAMINOPHEN-BUTALBITAL 50-325 MG TABS Take 1 tablet by mouth 3 (three) times daily as needed. 120 tablet 0   ALPRAZolam (XANAX) 0.25 MG tablet Take 1 tablet (0.25 mg total) by mouth 2 (two) times  daily as needed for anxiety. 20 tablet 1   ALPRAZolam (XANAX) 0.25 MG tablet Take 1 tablet (0.25 mg total) by mouth daily as needed for anxiety. Do not take until after arrival to office. 2 tablet 0   aspirin 325 MG tablet Take 325 mg by mouth daily.     atenolol (TENORMIN) 25 MG tablet Take 1 tablet (25 mg total) by mouth daily. 30 tablet 3   budesonide (RHINOCORT AQUA) 32 MCG/ACT nasal spray Place 2 sprays into both nostrils daily. 8.6 g 2   diltiazem (CARDIZEM) 30 MG tablet TAKE 1 TABLET 2 TIMES DAILY MAY TAKE 1 TAB EVERY 4-6 HRS AS NEEDED FOR SUSTAINED HR >100 BPM. 240 tablet 1   hydrochlorothiazide (HYDRODIURIL) 12.5 MG tablet Take 12.5 mg by mouth daily.     hydrocortisone 2.5 % ointment Apply topically 2 (two) times daily.     ketoconazole (NIZORAL) 2 % shampoo Apply 1 application topically 2 (two) times a week. 120 mL 1   levothyroxine (SYNTHROID) 25 MCG tablet Take 1 tablet (25 mcg total) by mouth daily before breakfast. 90 tablet 1   meclizine (ANTIVERT) 25 MG tablet Take 1 tablet (25 mg total) by mouth 3 (three) times daily as needed for dizziness. 30 tablet 0   meloxicam (MOBIC) 15 MG tablet Take 1 tablet (15 mg total) by mouth daily as needed for pain. 30 tablet 2   methocarbamol (ROBAXIN) 500 MG tablet Take 1 tablet (500 mg total) by mouth every 8 (eight) hours as needed. 90 tablet 2   mometasone (ELOCON) 0.1 % cream Apply topically daily. 50 g 1   montelukast (SINGULAIR) 10 MG tablet TAKE 1 TABLET BY MOUTH EVERYDAY AT BEDTIME 90 tablet 1   omeprazole (PRILOSEC) 40 MG capsule TAKE 1 CAPSULE (40 MG TOTAL) BY MOUTH DAILY. 90 capsule 1   triamcinolone cream (KENALOG) 0.1 % APPLY TOPICALLY 2 (TWO) TIMES DAILY. APPLY TOPICALLY 30 g 3   XOPENEX HFA 45 MCG/ACT inhaler INHALE 1 TO 2 PUFFS BY MOUTH EVERY 6 HOURS AS NEEDED FOR WHEEZE 15 each 12   XOPENEX HFA 45 MCG/ACT inhaler INHALE 1 TO 2 PUFFS BY MOUTH EVERY 6 HOURS AS NEEDED FOR WHEEZE 15 each 12   No facility-administered medications  prior to visit.    Allergies  Allergen Reactions   Amoxicillin Other ([place] Comments)    Rash   Nitrous Oxide Other ([place] Comments)    Paradoxical response-got very hyper   Penicillins Other ([place] Comments)    Rash   Sodium Chloride Swelling    And increased BP    Review of Systems  Constitutional:  Negative for fever and malaise/fatigue.  HENT:  Negative for congestion.   Eyes:  Negative for blurred vision.  Respiratory:  Positive for shortness of breath.   Cardiovascular:  Negative for chest pain, palpitations and leg swelling.  Gastrointestinal:  Negative for abdominal pain, blood in stool and nausea.  Genitourinary:  Negative for dysuria and frequency.  Musculoskeletal:  Negative for falls.  Skin:  Negative for rash.  Neurological:  Negative for dizziness, loss of consciousness and headaches.  Endo/Heme/Allergies:  Negative for environmental allergies.  Psychiatric/Behavioral:  Negative for depression. The patient is not nervous/anxious.        Objective:    Physical Exam Constitutional:      General: She is not in acute distress.    Appearance: Normal appearance. She is not ill-appearing or toxic-appearing.  HENT:     Head: Normocephalic and atraumatic.     Right Ear: External ear normal.     Left Ear: External ear normal.     Nose: Nose normal.  Eyes:     General:        Right eye: No discharge.        Left eye: No discharge.  Pulmonary:     Effort: Pulmonary effort is normal.  Skin:    Findings: No rash.  Neurological:     Mental Status: She is alert and oriented to person, place, and time.  Psychiatric:        Behavior: Behavior normal.     There were no vitals taken for this visit. Wt Readings from Last 3 Encounters:  06/24/21 122 lb (55.3 kg)  05/27/21 122 lb (55.3 kg)  05/06/21 125 lb 3.2 oz (56.8 kg)    Diabetic Foot Exam - Simple   No data filed    Lab Results  Component Value Date   WBC 5.2 05/06/2021   HGB 12.4 05/06/2021   HCT 36.6  05/06/2021   PLT 269.0 05/06/2021   GLUCOSE 87 05/06/2021   CHOL 206 (H) 05/06/2021   TRIG 131.0 05/06/2021   HDL 78.10 05/06/2021   LDLCALC 102 (H) 05/06/2021   ALT 8 05/06/2021   AST 16 05/06/2021   NA 140 05/06/2021   K 4.2 05/06/2021   CL 101 05/06/2021   CREATININE 0.58 05/06/2021   BUN 7 05/06/2021   CO2 34 (H) 05/06/2021   TSH 2.47 05/06/2021    Lab Results  Component Value Date   TSH 2.47 05/06/2021   Lab Results  Component Value Date   WBC 5.2 05/06/2021   HGB 12.4 05/06/2021   HCT 36.6 05/06/2021   MCV 92.1 05/06/2021   PLT 269.0 05/06/2021   Lab Results  Component Value Date   NA 140 05/06/2021   K 4.2 05/06/2021   CO2 34 (H) 05/06/2021   GLUCOSE 87 05/06/2021   BUN 7 05/06/2021   CREATININE 0.58 05/06/2021   BILITOT 0.4 05/06/2021   ALKPHOS 40 05/06/2021   AST 16 05/06/2021   ALT 8 05/06/2021   PROT 7.4 05/06/2021   ALBUMIN 4.7 05/06/2021   CALCIUM 9.3 05/06/2021   ANIONGAP 10 09/08/2015   GFR 107.76 05/06/2021   Lab Results  Component Value Date   CHOL 206 (H) 05/06/2021   Lab Results  Component Value Date   HDL 78.10 05/06/2021   Lab Results  Component Value Date   LDLCALC 102 (H) 05/06/2021   Lab Results  Component Value Date   TRIG 131.0 05/06/2021   Lab Results  Component Value Date   CHOLHDL 3 05/06/2021   No results found  for: "HGBA1C"     Assessment & Plan:   Problem List Items Addressed This Visit     Thyroid disease - Primary   Hyperlipidemia, mixed    Encourage heart healthy diet such as MIND or DASH diet, increase exercise, avoid trans fats, simple carbohydrates and processed foods, consider a krill or fish or flaxseed oil cap daily.       Hypertension    Monitor and report any concerns, no changes to meds. Encouraged heart healthy diet such as the DASH diet and exercise as tolerated.       Reactive airway disease    Using Xopenex prn. Had a flare recently but is using Xopenex twice daily and she is doing  better.       Irregular menses    I have discontinued April Jackson's Xopenex HFA. I am also having her maintain her aspirin, budesonide, ACETAMINOPHEN-BUTALBITAL, levothyroxine, ALPRAZolam, ketoconazole, mometasone, montelukast, omeprazole, meloxicam, methocarbamol, atenolol, hydrochlorothiazide, hydrocortisone, meclizine, triamcinolone cream, diltiazem, ALPRAZolam, and levalbuterol.  Meds ordered this encounter  Medications   DISCONTD: levalbuterol (XOPENEX HFA) 45 MCG/ACT inhaler    Sig: Inhale 1-2 puffs into the lungs every 6 (six) hours as needed for wheezing.    Dispense:  1 each    Refill:  12   levalbuterol (XOPENEX HFA) 45 MCG/ACT inhaler    Sig: Inhale 1-2 puffs into the lungs every 6 (six) hours as needed for wheezing.    Dispense:  1 each    Refill:  5    Patient requests generic version    I discussed the assessment and treatment plan with the patient. The patient was provided an opportunity to ask questions and all were answered. The patient agreed with the plan and demonstrated an understanding of the instructions.   The patient was advised to call back or seek an in-person evaluation if the symptoms worsen or if the condition fails to improve as anticipated.    Penni Homans, MD [facility] at [facility] [phone] (phone) [phone] (fax)  [place]

## 2021-07-27 ENCOUNTER — Telehealth (INDEPENDENT_AMBULATORY_CARE_PROVIDER_SITE_OTHER): Payer: BC Managed Care – PPO | Admitting: Family Medicine

## 2021-07-27 ENCOUNTER — Encounter: Payer: Self-pay | Admitting: Family Medicine

## 2021-07-27 DIAGNOSIS — E079 Disorder of thyroid, unspecified: Secondary | ICD-10-CM

## 2021-07-27 DIAGNOSIS — I10 Essential (primary) hypertension: Secondary | ICD-10-CM

## 2021-07-27 DIAGNOSIS — N926 Irregular menstruation, unspecified: Secondary | ICD-10-CM | POA: Diagnosis not present

## 2021-07-27 DIAGNOSIS — E782 Mixed hyperlipidemia: Secondary | ICD-10-CM | POA: Diagnosis not present

## 2021-07-27 DIAGNOSIS — J4520 Mild intermittent asthma, uncomplicated: Secondary | ICD-10-CM

## 2021-07-27 MED ORDER — LEVALBUTEROL TARTRATE 45 MCG/ACT IN AERO: 1.0000 | INHALATION_SPRAY | Freq: Four times a day (QID) | RESPIRATORY_TRACT | 12 refills | Status: DC | PRN

## 2021-07-27 MED ORDER — LEVALBUTEROL TARTRATE 45 MCG/ACT IN AERO: 1.0000 | INHALATION_SPRAY | Freq: Four times a day (QID) | RESPIRATORY_TRACT | 5 refills | Status: AC | PRN

## 2021-07-27 MED ORDER — XOPENEX HFA 45 MCG/ACT IN AERO: INHALATION_SPRAY | RESPIRATORY_TRACT | 12 refills | Status: DC

## 2021-07-28 NOTE — Assessment & Plan Note (Signed)
Encourage heart healthy diet such as MIND or DASH diet, increase exercise, avoid trans fats, simple carbohydrates and processed foods, consider a krill or fish or flaxseed oil cap daily.  °

## 2021-07-28 NOTE — Assessment & Plan Note (Signed)
Using Xopenex prn. Had a flare recently but is using Xopenex twice daily and she is doing better.

## 2021-07-28 NOTE — Assessment & Plan Note (Signed)
Monitor and report any concerns, no changes to meds. Encouraged heart healthy diet such as the DASH diet and exercise as tolerated.  ?

## 2021-07-31 DIAGNOSIS — M542 Cervicalgia: Secondary | ICD-10-CM | POA: Diagnosis not present

## 2021-07-31 DIAGNOSIS — M5459 Other low back pain: Secondary | ICD-10-CM | POA: Diagnosis not present

## 2021-07-31 DIAGNOSIS — R2689 Other abnormalities of gait and mobility: Secondary | ICD-10-CM | POA: Diagnosis not present

## 2021-07-31 DIAGNOSIS — R531 Weakness: Secondary | ICD-10-CM | POA: Diagnosis not present

## 2021-08-05 ENCOUNTER — Encounter: Payer: Self-pay | Admitting: Obstetrics and Gynecology

## 2021-08-05 ENCOUNTER — Ambulatory Visit: Payer: BC Managed Care – PPO | Admitting: Obstetrics and Gynecology

## 2021-08-05 VITALS — BP 113/73 | HR 62 | Ht 60.0 in | Wt 122.0 lb

## 2021-08-05 DIAGNOSIS — Z01812 Encounter for preprocedural laboratory examination: Secondary | ICD-10-CM | POA: Diagnosis not present

## 2021-08-05 DIAGNOSIS — N939 Abnormal uterine and vaginal bleeding, unspecified: Secondary | ICD-10-CM

## 2021-08-05 LAB — POCT URINE PREGNANCY: Preg Test, Ur: NEGATIVE

## 2021-08-05 NOTE — Progress Notes (Unsigned)
mws

## 2021-08-05 NOTE — Progress Notes (Unsigned)
ENDOMETRIAL BIOPSY      April Jackson is a 46 y.o. G0P0000 here for endometrial biopsy.  The indications for endometrial biopsy were reviewed.  Risks of the biopsy including cramping, bleeding, infection, uterine perforation, inadequate specimen and need for additional procedures were discussed. The patient states she understands and agrees to undergo procedure today. Consent was signed. Time out was performed.   Indications: *** Urine HCG: ***  A bivalve speculum was placed into the vagina and the cervix was easily visualized and was prepped with Betadine x2. A single-toothed tenaculum was placed on the anterior lip of the cervix to stabilize it. The 3 mm pipelle was introduced into the endometrial cavity without difficulty to a depth of *** cm, and a moderate amount of tissue was obtained and sent to pathology. This was repeated for a total of *** passes. The instruments were removed from the patient's vagina. ***Minimal bleeding from the cervix at the tenaculum was noted.   The patient tolerated the procedure well. Routine post-procedure instructions were given to the patient.    Will base further management on results of biopsy.  Feliz Beam, MD, [place] for [facility])

## 2021-08-06 MED ORDER — MISOPROSTOL 200 MCG PO TABS: ORAL_TABLET | ORAL | 1 refills | Status: DC

## 2021-08-19 ENCOUNTER — Ambulatory Visit: Payer: BC Managed Care – PPO | Admitting: Obstetrics and Gynecology

## 2021-08-19 ENCOUNTER — Other Ambulatory Visit (HOSPITAL_COMMUNITY)
Admission: RE | Admit: 2021-08-19 | Discharge: 2021-08-19 | Disposition: A | Discharge status: Home / Self Care | Payer: BC Managed Care – PPO | Source: Ambulatory Visit | Attending: Obstetrics and Gynecology | Admitting: Obstetrics and Gynecology

## 2021-08-19 ENCOUNTER — Encounter: Payer: Self-pay | Admitting: Obstetrics and Gynecology

## 2021-08-19 VITALS — BP 122/78 | HR 65 | Ht 60.0 in | Wt 124.0 lb

## 2021-08-19 DIAGNOSIS — N939 Abnormal uterine and vaginal bleeding, unspecified: Secondary | ICD-10-CM | POA: Diagnosis not present

## 2021-08-19 DIAGNOSIS — Z01812 Encounter for preprocedural laboratory examination: Secondary | ICD-10-CM | POA: Diagnosis not present

## 2021-08-19 DIAGNOSIS — N711 Chronic inflammatory disease of uterus: Secondary | ICD-10-CM | POA: Diagnosis not present

## 2021-08-19 LAB — POCT URINE PREGNANCY: Preg Test, Ur: NEGATIVE

## 2021-08-19 NOTE — Progress Notes (Signed)
ENDOMETRIAL BIOPSY      April Jackson is a 46 y.o. G0P0000 here for endometrial biopsy. Had failed endometrial biopsy previously, took cytotec 4 hrs ago and has had cramping and bleeding since  The indications for endometrial biopsy were reviewed.  Risks of the biopsy including cramping, bleeding, infection, uterine perforation, inadequate specimen and need for additional procedures were discussed. The patient states she understands and agrees to undergo procedure today. Consent was signed. Time out was performed.   Indications: dysfunctional uterine bleeding Urine HCG: negative  Topical lidocaine sprayed onto vulva. A bivalve speculum was placed into the vagina and the cervix was easily visualized and hurricane spray applied, cervix was prepped with Betadine x2. A single-toothed tenaculum was placed on the anterior lip of the cervix to stabilize it. The 3 mm pipelle was introduced into the endometrial cavity without difficulty to a depth of 7 cm, and a moderate amount of tissue was obtained and sent to pathology. This was repeated for a total of 3 passes. The instruments were removed from the patient's vagina. Minimal bleeding from the cervix at the tenaculum was noted.   The patient tolerated the procedure well. Routine post-procedure instructions were given to the patient.    Will base further management on results of biopsy.  Feliz Beam, MD, [place] for [facility])

## 2021-08-25 ENCOUNTER — Other Ambulatory Visit: Payer: Self-pay | Admitting: Family Medicine

## 2021-08-25 DIAGNOSIS — N926 Irregular menstruation, unspecified: Secondary | ICD-10-CM

## 2021-08-25 LAB — SURGICAL PATHOLOGY

## 2021-08-25 MED ORDER — DOXYCYCLINE HYCLATE 100 MG PO CAPS: 100.0000 mg | ORAL_CAPSULE | Freq: Two times a day (BID) | ORAL | 0 refills | Status: DC

## 2021-08-30 DIAGNOSIS — M542 Cervicalgia: Secondary | ICD-10-CM | POA: Diagnosis not present

## 2021-08-30 DIAGNOSIS — R2689 Other abnormalities of gait and mobility: Secondary | ICD-10-CM | POA: Diagnosis not present

## 2021-08-30 DIAGNOSIS — R531 Weakness: Secondary | ICD-10-CM | POA: Diagnosis not present

## 2021-08-30 DIAGNOSIS — M5459 Other low back pain: Secondary | ICD-10-CM | POA: Diagnosis not present

## 2021-09-30 DIAGNOSIS — M5459 Other low back pain: Secondary | ICD-10-CM | POA: Diagnosis not present

## 2021-09-30 DIAGNOSIS — R2689 Other abnormalities of gait and mobility: Secondary | ICD-10-CM | POA: Diagnosis not present

## 2021-09-30 DIAGNOSIS — R531 Weakness: Secondary | ICD-10-CM | POA: Diagnosis not present

## 2021-09-30 DIAGNOSIS — M542 Cervicalgia: Secondary | ICD-10-CM | POA: Diagnosis not present

## 2021-10-01 DIAGNOSIS — H04123 Dry eye syndrome of bilateral lacrimal glands: Secondary | ICD-10-CM | POA: Diagnosis not present

## 2021-10-01 DIAGNOSIS — H5203 Hypermetropia, bilateral: Secondary | ICD-10-CM | POA: Diagnosis not present

## 2021-10-18 NOTE — Assessment & Plan Note (Signed)
Well controlled, no changes to meds. Encouraged heart healthy diet such as the DASH diet and exercise as tolerated.  °

## 2021-10-18 NOTE — Assessment & Plan Note (Signed)
No recent exacerbation 

## 2021-10-18 NOTE — Assessment & Plan Note (Signed)
Encourage heart healthy diet such as MIND or DASH diet, increase exercise, avoid trans fats, simple carbohydrates and processed foods, consider a krill or fish or flaxseed oil cap daily.  °

## 2021-10-19 ENCOUNTER — Ambulatory Visit: Payer: BC Managed Care – PPO | Admitting: Family Medicine

## 2021-10-19 DIAGNOSIS — I10 Essential (primary) hypertension: Secondary | ICD-10-CM | POA: Diagnosis not present

## 2021-10-19 DIAGNOSIS — J4520 Mild intermittent asthma, uncomplicated: Secondary | ICD-10-CM

## 2021-10-19 DIAGNOSIS — E782 Mixed hyperlipidemia: Secondary | ICD-10-CM | POA: Diagnosis not present

## 2021-10-19 NOTE — Patient Instructions (Addendum)
[employer]  8 strain probiotic daily   Covid booster when new version out late September or early october At pharmacy High dose flu shot mid Sept to mid Oct

## 2021-10-19 NOTE — Progress Notes (Signed)
Subjective:   By signing my name below, I, April Jackson, attest that this documentation has been prepared under the direction and in the presence of [name], MD 10/19/2021.   Patient ID: April Jackson, female    DOB: Jan 21, 1976, 46 y.o.   MRN: [id number]  No chief complaint on file.  HPI Patient is in today for an office visit.  Cologuard: She completed a Cologuard on 07/02/2021, which produced normal results. Repeat in 3 years.   OBGYN: She saw her OBGYN on 08/19/2021 where she had an endometrial biopsy and was prescribed Doxycycline 100 mg to control irregular menses.   Supplements: She is inquiring about appropriate probiotics to take.   Past Medical History:  Diagnosis Date   Allergy    childhood allergies, hives.    Anemia 04/15/2016   Anxiety and depression 09/21/2018   Back pain 09/28/2014   Back pain affecting pregnancy, antepartum 09/28/2014   Cervical cancer screening 01/27/2015   Menarche at 11 Regular and moderate flow No history of abnormal pap in past No sexual activity G0P0, s/p Nohistory of abnormal MGM No concerns today No gyn surgeries   FH: breast cancer 09/28/2014   Hyperlipidemia, mixed 09/28/2014   Hypertension    Pruritus 09/28/2014   Reactive airway disease 04/15/2016   Seasonal allergies    SVT (supraventricular tachycardia) ([place])    Thyroid disease    No past surgical history on file.  Family History  Problem Relation Age of Onset   Heart disease Father    Pulmonary fibrosis Father        pneumonia   Cancer Sister 84       cervical cancer   Hypertension Sister    Hyperlipidemia Sister    Gout Sister    Diabetes Mother    Hypertension Mother    Hyperlipidemia Mother    Cancer Mother        cervical cancer   Diabetes Maternal Grandmother    Stroke Paternal Grandmother    Heart disease Paternal Grandfather        MI   Hypertension Sister    Cholelithiasis Sister    Thyroid disease Sister    Ovarian cysts Sister    Cancer Sister         breast   Asthma Sister 3   Social History   Socioeconomic History   Marital status: Single    Spouse name: Not on file   Number of children: 0   Years of education: Not on file   Highest education level: Not on file  Occupational History   Occupation: DENTIST    Employer: SELF EMPLOYED  Tobacco Use   Smoking status: Never   Smokeless tobacco: Never  Vaping Use   Vaping Use: Never used  Substance and Sexual Activity   Alcohol use: No   Drug use: No   Sexual activity: Never    Birth control/protection: None    Comment: Dentist, no dietary restriction, lives with sister  Other Topics Concern   Not on file  Social History Narrative   Not on file   Social Determinants of Health   Financial Resource Strain: Not on file  Food Insecurity: Not on file  Transportation Needs: Not on file  Physical Activity: Not on file  Stress: Not on file  Social Connections: Not on file  Intimate Partner Violence: Not on file   Outpatient Medications Prior to Visit  Medication Sig Dispense Refill   ACETAMINOPHEN-BUTALBITAL 50-325 MG TABS Take 1  tablet by mouth 3 (three) times daily as needed. 120 tablet 0   ALPRAZolam (XANAX) 0.25 MG tablet Take 1 tablet (0.25 mg total) by mouth 2 (two) times daily as needed for anxiety. 20 tablet 1   ALPRAZolam (XANAX) 0.25 MG tablet Take 1 tablet (0.25 mg total) by mouth daily as needed for anxiety. Do not take until after arrival to office. 2 tablet 0   aspirin 325 MG tablet Take 325 mg by mouth daily.     atenolol (TENORMIN) 25 MG tablet Take 1 tablet (25 mg total) by mouth daily. 30 tablet 3   budesonide (RHINOCORT AQUA) 32 MCG/ACT nasal spray Place 2 sprays into both nostrils daily. 8.6 g 2   diltiazem (CARDIZEM) 30 MG tablet TAKE 1 TABLET 2 TIMES DAILY MAY TAKE 1 TAB EVERY 4-6 HRS AS NEEDED FOR SUSTAINED HR >100 BPM. 240 tablet 1   doxycycline (VIBRAMYCIN) 100 MG capsule Take 1 capsule (100 mg total) by mouth 2 (two) times daily. 20 capsule 0    hydrochlorothiazide (HYDRODIURIL) 12.5 MG tablet Take 12.5 mg by mouth daily.     hydrocortisone 2.5 % ointment Apply topically 2 (two) times daily.     ketoconazole (NIZORAL) 2 % shampoo Apply 1 application topically 2 (two) times a week. 120 mL 1   levalbuterol (XOPENEX HFA) 45 MCG/ACT inhaler Inhale 1-2 puffs into the lungs every 6 (six) hours as needed for wheezing. 1 each 5   levothyroxine (SYNTHROID) 25 MCG tablet Take 1 tablet (25 mcg total) by mouth daily before breakfast. 90 tablet 1   meclizine (ANTIVERT) 25 MG tablet Take 1 tablet (25 mg total) by mouth 3 (three) times daily as needed for dizziness. 30 tablet 0   meloxicam (MOBIC) 15 MG tablet Take 1 tablet (15 mg total) by mouth daily as needed for pain. 30 tablet 2   methocarbamol (ROBAXIN) 500 MG tablet Take 1 tablet (500 mg total) by mouth every 8 (eight) hours as needed. 90 tablet 2   misoprostol (CYTOTEC) 200 MCG tablet Place four tablets in between your gums and cheeks (two tablets on each side) 4 hours prior to your appointment. 4 tablet 1   mometasone (ELOCON) 0.1 % cream Apply topically daily. 50 g 1   montelukast (SINGULAIR) 10 MG tablet TAKE 1 TABLET BY MOUTH EVERYDAY AT BEDTIME 90 tablet 1   omeprazole (PRILOSEC) 40 MG capsule TAKE 1 CAPSULE (40 MG TOTAL) BY MOUTH DAILY. 90 capsule 1   triamcinolone cream (KENALOG) 0.1 % APPLY TOPICALLY 2 (TWO) TIMES DAILY. APPLY TOPICALLY 30 g 3   No facility-administered medications prior to visit.   Allergies  Allergen Reactions   Amoxicillin Other ([place] Comments)    Rash   Nitrous Oxide Other ([place] Comments)    Paradoxical response-got very hyper   Penicillin G Other ([place] Comments)   Penicillins Other ([place] Comments)    Rash   Sodium Chloride Swelling    And increased BP   ROS     Objective:    Physical Exam Constitutional:      General: She is not in acute distress.    Appearance: Normal appearance. She is not ill-appearing.  HENT:     Head: Normocephalic and  atraumatic.     Right Ear: External ear normal.     Left Ear: External ear normal.     Mouth/Throat:     Mouth: Mucous membranes are moist.     Pharynx: Oropharynx is clear.  Eyes:     Extraocular Movements:  Extraocular movements intact.     Pupils: Pupils are equal, round, and reactive to light.  Cardiovascular:     Rate and Rhythm: Normal rate and regular rhythm.     Pulses: Normal pulses.     Heart sounds: Normal heart sounds. No murmur heard.    No gallop.  Pulmonary:     Effort: Pulmonary effort is normal. No respiratory distress.     Breath sounds: Normal breath sounds. No wheezing or rales.  Abdominal:     General: Bowel sounds are normal.  Skin:    General: Skin is warm and dry.  Neurological:     Mental Status: She is alert and oriented to person, place, and time.  Psychiatric:        Mood and Affect: Mood normal.        Behavior: Behavior normal.        Judgment: Judgment normal.    There were no vitals taken for this visit. Wt Readings from Last 3 Encounters:  08/19/21 124 lb (56.2 kg)  08/05/21 122 lb (55.3 kg)  06/24/21 122 lb (55.3 kg)   Diabetic Foot Exam - Simple   No data filed    Lab Results  Component Value Date   WBC 5.2 05/06/2021   HGB 12.4 05/06/2021   HCT 36.6 05/06/2021   PLT 269.0 05/06/2021   GLUCOSE 87 05/06/2021   CHOL 206 (H) 05/06/2021   TRIG 131.0 05/06/2021   HDL 78.10 05/06/2021   LDLCALC 102 (H) 05/06/2021   ALT 8 05/06/2021   AST 16 05/06/2021   NA 140 05/06/2021   K 4.2 05/06/2021   CL 101 05/06/2021   CREATININE 0.58 05/06/2021   BUN 7 05/06/2021   CO2 34 (H) 05/06/2021   TSH 2.47 05/06/2021   Lab Results  Component Value Date   TSH 2.47 05/06/2021   Lab Results  Component Value Date   WBC 5.2 05/06/2021   HGB 12.4 05/06/2021   HCT 36.6 05/06/2021   MCV 92.1 05/06/2021   PLT 269.0 05/06/2021   Lab Results  Component Value Date   NA 140 05/06/2021   K 4.2 05/06/2021   CO2 34 (H) 05/06/2021   GLUCOSE 87  05/06/2021   BUN 7 05/06/2021   CREATININE 0.58 05/06/2021   BILITOT 0.4 05/06/2021   ALKPHOS 40 05/06/2021   AST 16 05/06/2021   ALT 8 05/06/2021   PROT 7.4 05/06/2021   ALBUMIN 4.7 05/06/2021   CALCIUM 9.3 05/06/2021   ANIONGAP 10 09/08/2015   GFR 107.76 05/06/2021   Lab Results  Component Value Date   CHOL 206 (H) 05/06/2021   Lab Results  Component Value Date   HDL 78.10 05/06/2021   Lab Results  Component Value Date   LDLCALC 102 (H) 05/06/2021   Lab Results  Component Value Date   TRIG 131.0 05/06/2021   Lab Results  Component Value Date   CHOLHDL 3 05/06/2021   No results found for: "HGBA1C"     Assessment & Plan:   Problem List Items Addressed This Visit       Cardiovascular and Mediastinum   Hypertension     Respiratory   Reactive airway disease     Other   Hyperlipidemia, mixed   No orders of the defined types were placed in this encounter.  I, April Jackson, personally preformed the services described in this documentation.  All medical record entries made by the scribe were at my direction and in my presence.  I have  reviewed the chart and discharge instructions (if applicable) and agree that the record reflects my personal performance and is accurate and complete. 10/19/2021  I,Mohammed Iqbal,acting as a scribe for Penni Homans, MD.,have documented all relevant documentation on the behalf of Penni Homans, MD,as directed by  Penni Homans, MD while in the presence of Penni Homans, MD.  April Jackson

## 2021-10-20 LAB — CBC
HCT: 34.8 % — ABNORMAL LOW (ref 36.0–46.0)
Hemoglobin: 11.5 g/dL — ABNORMAL LOW (ref 12.0–15.0)
MCHC: 33.2 g/dL (ref 30.0–36.0)
MCV: 93.6 fl (ref 78.0–100.0)
Platelets: 231 10*3/uL (ref 150.0–400.0)
RBC: 3.72 Mil/uL — ABNORMAL LOW (ref 3.87–5.11)
RDW: 12.3 % (ref 11.5–15.5)
WBC: 5 10*3/uL (ref 4.0–10.5)

## 2021-10-20 LAB — COMPREHENSIVE METABOLIC PANEL
ALT: 13 U/L (ref 0–35)
AST: 20 U/L (ref 0–37)
Albumin: 4.1 g/dL (ref 3.5–5.2)
Alkaline Phosphatase: 40 U/L (ref 39–117)
BUN: 8 mg/dL (ref 6–23)
CO2: 29 mEq/L (ref 19–32)
Calcium: 9 mg/dL (ref 8.4–10.5)
Chloride: 101 mEq/L (ref 96–112)
Creatinine, Ser: 0.58 mg/dL (ref 0.40–1.20)
GFR: 107.41 mL/min (ref >60.00)
Glucose, Bld: 81 mg/dL (ref 70–99)
Potassium: 4.1 mEq/L (ref 3.5–5.1)
Sodium: 139 mEq/L (ref 135–145)
Total Bilirubin: 0.4 mg/dL (ref 0.2–1.2)
Total Protein: 7.2 g/dL (ref 6.0–8.3)

## 2021-10-20 LAB — LIPID PANEL
Cholesterol: 187 mg/dL (ref 0–200)
HDL: 68.7 mg/dL (ref >39.00)
LDL Cholesterol: 106 mg/dL — ABNORMAL HIGH (ref 0–99)
NonHDL: 117.88
Total CHOL/HDL Ratio: 3
Triglycerides: 61 mg/dL (ref 0.0–149.0)
VLDL: 12.2 mg/dL (ref 0.0–40.0)

## 2021-10-20 LAB — TSH: TSH: 2.71 u[IU]/mL (ref 0.35–5.50)

## 2021-10-31 DIAGNOSIS — M542 Cervicalgia: Secondary | ICD-10-CM | POA: Diagnosis not present

## 2021-10-31 DIAGNOSIS — M5459 Other low back pain: Secondary | ICD-10-CM | POA: Diagnosis not present

## 2021-10-31 DIAGNOSIS — R2689 Other abnormalities of gait and mobility: Secondary | ICD-10-CM | POA: Diagnosis not present

## 2021-10-31 DIAGNOSIS — R531 Weakness: Secondary | ICD-10-CM | POA: Diagnosis not present

## 2021-11-30 DIAGNOSIS — R531 Weakness: Secondary | ICD-10-CM | POA: Diagnosis not present

## 2021-11-30 DIAGNOSIS — M542 Cervicalgia: Secondary | ICD-10-CM | POA: Diagnosis not present

## 2021-11-30 DIAGNOSIS — M5459 Other low back pain: Secondary | ICD-10-CM | POA: Diagnosis not present

## 2021-11-30 DIAGNOSIS — R2689 Other abnormalities of gait and mobility: Secondary | ICD-10-CM | POA: Diagnosis not present

## 2021-12-12 ENCOUNTER — Other Ambulatory Visit: Payer: Self-pay | Admitting: Family Medicine

## 2021-12-12 DIAGNOSIS — E782 Mixed hyperlipidemia: Secondary | ICD-10-CM

## 2021-12-24 DIAGNOSIS — Z9889 Other specified postprocedural states: Secondary | ICD-10-CM | POA: Diagnosis not present

## 2021-12-24 DIAGNOSIS — E782 Mixed hyperlipidemia: Secondary | ICD-10-CM | POA: Diagnosis not present

## 2021-12-24 DIAGNOSIS — M25471 Effusion, right ankle: Secondary | ICD-10-CM | POA: Diagnosis not present

## 2021-12-24 DIAGNOSIS — I471 Supraventricular tachycardia, unspecified: Secondary | ICD-10-CM | POA: Diagnosis not present

## 2021-12-31 DIAGNOSIS — R631 Polydipsia: Secondary | ICD-10-CM | POA: Diagnosis not present

## 2021-12-31 DIAGNOSIS — M5459 Other low back pain: Secondary | ICD-10-CM | POA: Diagnosis not present

## 2021-12-31 DIAGNOSIS — R2689 Other abnormalities of gait and mobility: Secondary | ICD-10-CM | POA: Diagnosis not present

## 2021-12-31 DIAGNOSIS — M542 Cervicalgia: Secondary | ICD-10-CM | POA: Diagnosis not present

## 2022-02-11 DIAGNOSIS — D1801 Hemangioma of skin and subcutaneous tissue: Secondary | ICD-10-CM | POA: Diagnosis not present

## 2022-02-11 DIAGNOSIS — L308 Other specified dermatitis: Secondary | ICD-10-CM | POA: Diagnosis not present

## 2022-02-11 DIAGNOSIS — L2089 Other atopic dermatitis: Secondary | ICD-10-CM | POA: Diagnosis not present

## 2022-02-11 DIAGNOSIS — L738 Other specified follicular disorders: Secondary | ICD-10-CM | POA: Diagnosis not present

## 2022-02-27 ENCOUNTER — Other Ambulatory Visit: Payer: Self-pay | Admitting: Family Medicine

## 2022-03-02 DIAGNOSIS — M542 Cervicalgia: Secondary | ICD-10-CM | POA: Diagnosis not present

## 2022-03-02 DIAGNOSIS — R531 Weakness: Secondary | ICD-10-CM | POA: Diagnosis not present

## 2022-03-02 DIAGNOSIS — M5459 Other low back pain: Secondary | ICD-10-CM | POA: Diagnosis not present

## 2022-03-02 DIAGNOSIS — R2689 Other abnormalities of gait and mobility: Secondary | ICD-10-CM | POA: Diagnosis not present

## 2022-05-06 NOTE — Progress Notes (Signed)
Subjective:   By signing my name below, I, April Jackson, attest that this documentation has been prepared under the direction and in the presence of [name], MD.  05/09/2022.     Patient ID: April Jackson, female    DOB: 12-Oct-1975, 47 y.o.   MRN: [id number]  No chief complaint on file.   HPI Patient is in today for a comprehensive physical exam.    Social history: Colonoscopy: Dexa/PSA: Pap Smear: Mammogram: Immunizations: Diet: Exercise: Dental: Vision:  Denies having any fever, new muscle pain, joint pain, new moles, congestion, sinus pain, sore throat, chest pain, palpitations, cough, SOB, wheezing, n/v/d, constipation, blood in stool, dysuria, frequency, hematuria, at this time.  Past Medical History:  Diagnosis Date   Allergy    childhood allergies, hives.    Anemia 04/15/2016   Anxiety and depression 09/21/2018   Back pain 09/28/2014   Back pain affecting pregnancy, antepartum 09/28/2014   Cervical cancer screening 01/27/2015   Menarche at 11 Regular and moderate flow No history of abnormal pap in past No sexual activity G0P0, s/p Nohistory of abnormal MGM No concerns today No gyn surgeries   FH: breast cancer 09/28/2014   Hyperlipidemia, mixed 09/28/2014   Hypertension    Pruritus 09/28/2014   Reactive airway disease 04/15/2016   Seasonal allergies    SVT (supraventricular tachycardia) ([place])    Thyroid disease     No past surgical history on file.  Family History  Problem Relation Age of Onset   Heart disease Father    Pulmonary fibrosis Father        pneumonia   Cancer Sister 66       cervical cancer   Hypertension Sister    Hyperlipidemia Sister    Gout Sister    Diabetes Mother    Hypertension Mother    Hyperlipidemia Mother    Cancer Mother        cervical cancer   Diabetes Maternal Grandmother    Stroke Paternal Grandmother    Heart disease Paternal Grandfather        MI   Hypertension Sister    Cholelithiasis Sister    Thyroid disease  Sister    Ovarian cysts Sister    Cancer Sister        breast   Asthma Sister 59    Social History   Socioeconomic History   Marital status: Single    Spouse name: Not on file   Number of children: 0   Years of education: Not on file   Highest education level: Not on file  Occupational History   Occupation: DENTIST    Employer: SELF EMPLOYED  Tobacco Use   Smoking status: Never   Smokeless tobacco: Never  Vaping Use   Vaping Use: Never used  Substance and Sexual Activity   Alcohol use: No   Drug use: No   Sexual activity: Never    Birth control/protection: None    Comment: Dentist, 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 120 tablet 0   ALPRAZolam (XANAX) 0.25 MG tablet Take 1 tablet (0.25 mg total) by mouth 2 (two) times daily as needed for anxiety. 20 tablet 1   ALPRAZolam (XANAX) 0.25 MG tablet Take 1 tablet (0.25 mg total) by mouth daily as needed for anxiety. Do not take until after arrival to office. (Patient not taking: Reported on 10/19/2021) 2 tablet 0   aspirin 325 MG tablet Take 325 mg by mouth daily.     atenolol (TENORMIN) 25 MG tablet Take 1 tablet by mouth once daily 90 tablet 1   budesonide (RHINOCORT AQUA) 32 MCG/ACT nasal spray Place 2 sprays into both nostrils daily. 8.6 g 2   diltiazem (CARDIZEM) 30 MG tablet TAKE 1 TABLET 2 TIMES DAILY MAY TAKE 1 TAB EVERY 4-6 HRS AS NEEDED FOR SUSTAINED HR >100 BPM. 240 tablet 1   doxycycline (VIBRAMYCIN)  100 MG capsule Take 1 capsule (100 mg total) by mouth 2 (two) times daily. (Patient not taking: Reported on 10/19/2021) 20 capsule 0   hydrochlorothiazide (HYDRODIURIL) 12.5 MG tablet Take 12.5 mg by mouth daily.     hydrocortisone 2.5 % ointment Apply topically 2 (two) times daily.     ketoconazole (NIZORAL) 2 % shampoo Apply 1 application topically 2 (two) times a week. 120 mL 1   levalbuterol (XOPENEX HFA) 45 MCG/ACT inhaler Inhale 1-2 puffs into the lungs every 6 (six) hours as needed for wheezing. 1 each 5   levothyroxine (SYNTHROID) 25 MCG tablet Take 1 tablet (25 mcg total) by mouth daily before breakfast. 90 tablet 1   meclizine (ANTIVERT) 25 MG tablet Take 1 tablet (25 mg total) by mouth 3 (three) times daily as needed for dizziness. 30 tablet 0   meloxicam (MOBIC) 15 MG tablet Take 1 tablet (15 mg total) by mouth daily as needed for pain. 30 tablet 2   methocarbamol (ROBAXIN) 500 MG tablet Take 1 tablet (500 mg total) by mouth every 8 (eight) hours as needed. 90 tablet 2   misoprostol (CYTOTEC) 200 MCG tablet Place four tablets in between your gums and cheeks (two tablets on each side) 4 hours prior to your appointment. 4 tablet 1   mometasone (ELOCON) 0.1 % cream Apply topically daily. 50 g 1   montelukast (SINGULAIR) 10 MG tablet TAKE 1 TABLET BY MOUTH EVERYDAY AT BEDTIME 90 tablet 1   omeprazole (PRILOSEC) 40 MG capsule TAKE 1 CAPSULE (40 MG TOTAL) BY MOUTH DAILY. 90 capsule 1   triamcinolone cream (KENALOG) 0.1 % APPLY TOPICALLY 2 (TWO) TIMES DAILY. APPLY TOPICALLY (Patient not taking: Reported on 10/19/2021) 30 g 3   No facility-administered medications prior to visit.    Allergies  Allergen Reactions   Amoxicillin Other ([place] Comments)    Rash   Nitrous Oxide Other ([place] Comments)    Paradoxical response-got very hyper   Penicillin G Other ([place] Comments)   Penicillins Other ([place] Comments)    Rash   Sodium Chloride Swelling    And increased BP    Review of Systems   Constitutional:  Negative for fever.  HENT:  Negative for congestion, sinus pain and sore throat.   Respiratory:  Negative for cough, shortness of breath and wheezing.   Cardiovascular:  Negative for chest pain and palpitations.  Gastrointestinal:  Negative for blood in stool, constipation, diarrhea, nausea and vomiting.  Genitourinary:  Negative for dysuria, frequency and hematuria.  Musculoskeletal:  Negative for joint  pain and myalgias.       Objective:    Physical Exam Constitutional:      Appearance: Normal appearance.  HENT:     Head: Normocephalic and atraumatic.     Right Ear: Tympanic membrane, ear canal and external ear normal.     Left Ear: Tympanic membrane, ear canal and external ear normal.  Eyes:     Extraocular Movements: Extraocular movements intact.     Pupils: Pupils are equal, round, and reactive to light.  Cardiovascular:     Rate and Rhythm: Normal rate and regular rhythm.     Heart sounds: Normal heart sounds. No murmur heard.    No gallop.  Pulmonary:     Effort: Pulmonary effort is normal. No respiratory distress.     Breath sounds: Normal breath sounds. No wheezing or rales.  Abdominal:     General: Bowel sounds are normal. There is no distension.     Palpations: Abdomen is soft.     Tenderness: There is no abdominal tenderness. There is no guarding.  Musculoskeletal:        General: Normal range of motion.  Skin:    General: Skin is warm and dry.  Neurological:     General: No focal deficit present.     Mental Status: She is alert and oriented to person, place, and time.  Psychiatric:        Mood and Affect: Mood normal.        Behavior: Behavior normal.     There were no vitals taken for this visit. Wt Readings from Last 3 Encounters:  10/19/21 123 lb 3.2 oz (55.9 kg)  08/19/21 124 lb (56.2 kg)  08/05/21 122 lb (55.3 kg)    Diabetic Foot Exam - Simple   No data filed    Lab Results  Component Value Date   WBC 5.0 10/19/2021    HGB 11.5 (L) 10/19/2021   HCT 34.8 (L) 10/19/2021   PLT 231.0 10/19/2021   GLUCOSE 81 10/19/2021   CHOL 187 10/19/2021   TRIG 61.0 10/19/2021   HDL 68.70 10/19/2021   LDLCALC 106 (H) 10/19/2021   ALT 13 10/19/2021   AST 20 10/19/2021   NA 139 10/19/2021   K 4.1 10/19/2021   CL 101 10/19/2021   CREATININE 0.58 10/19/2021   BUN 8 10/19/2021   CO2 29 10/19/2021   TSH 2.71 10/19/2021    Lab Results  Component Value Date   TSH 2.71 10/19/2021   Lab Results  Component Value Date   WBC 5.0 10/19/2021   HGB 11.5 (L) 10/19/2021   HCT 34.8 (L) 10/19/2021   MCV 93.6 10/19/2021   PLT 231.0 10/19/2021   Lab Results  Component Value Date   NA 139 10/19/2021   K 4.1 10/19/2021   CO2 29 10/19/2021   GLUCOSE 81 10/19/2021   BUN 8 10/19/2021   CREATININE 0.58 10/19/2021   BILITOT 0.4 10/19/2021   ALKPHOS 40 10/19/2021   AST 20 10/19/2021   ALT 13 10/19/2021   PROT 7.2 10/19/2021   ALBUMIN 4.1 10/19/2021   CALCIUM 9.0 10/19/2021   ANIONGAP 10 09/08/2015   GFR 107.41 10/19/2021   Lab Results  Component Value Date   CHOL 187 10/19/2021   Lab Results  Component Value Date   HDL 68.70 10/19/2021   Lab Results  Component Value Date   LDLCALC 106 (H) 10/19/2021   Lab Results  Component Value Date   TRIG 61.0 10/19/2021   Lab Results  Component Value Date   CHOLHDL 3 10/19/2021   No results found for: "HGBA1C"     Assessment & Plan:   Problem List Items Addressed This Visit   None    No orders of the defined types were placed in this encounter.   Alphonzo Grieve, personally preformed the services described in this documentation.  All medical record entries made by the scribe were at my direction and in my presence.  I have reviewed the chart and discharge instructions (if applicable) and agree that the record reflects my personal performance and is accurate and complete. 05/09/2022.  I,Mathew Stumpf,acting as a Education administrator for Penni Homans, MD.,have documented all  relevant documentation on the behalf of Penni Homans, MD,as directed by  Penni Homans, MD while in the presence of Penni Homans, MD.   April Jackson

## 2022-05-08 NOTE — Assessment & Plan Note (Signed)
Well controlled, no changes to meds. Encouraged heart healthy diet such as the DASH diet and exercise as tolerated.  °

## 2022-05-08 NOTE — Assessment & Plan Note (Signed)
Encourage heart healthy diet such as MIND or DASH diet, increase exercise, avoid trans fats, simple carbohydrates and processed foods, consider a krill or fish or flaxseed oil cap daily.  °

## 2022-05-08 NOTE — Assessment & Plan Note (Signed)
On Levothyroxine, continue to monitor 

## 2022-05-08 NOTE — Assessment & Plan Note (Signed)
Patient encouraged to maintain heart healthy diet, regular exercise, adequate sleep. Consider daily probiotics. Take medications as prescribed. Labs ordered and reviewed. Sees OB/GYN for pap, MGM ordered.colonoscopy

## 2022-05-09 ENCOUNTER — Ambulatory Visit (INDEPENDENT_AMBULATORY_CARE_PROVIDER_SITE_OTHER): Payer: BC Managed Care – PPO | Admitting: Family Medicine

## 2022-05-09 ENCOUNTER — Encounter: Payer: Self-pay | Admitting: Family Medicine

## 2022-05-09 VITALS — BP 116/74 | HR 60 | Temp 98.0°F | Resp 16 | Ht 61.0 in | Wt 128.2 lb

## 2022-05-09 DIAGNOSIS — I10 Essential (primary) hypertension: Secondary | ICD-10-CM | POA: Diagnosis not present

## 2022-05-09 DIAGNOSIS — E782 Mixed hyperlipidemia: Secondary | ICD-10-CM | POA: Diagnosis not present

## 2022-05-09 DIAGNOSIS — E079 Disorder of thyroid, unspecified: Secondary | ICD-10-CM

## 2022-05-09 DIAGNOSIS — M25511 Pain in right shoulder: Secondary | ICD-10-CM

## 2022-05-09 DIAGNOSIS — Z0000 Encounter for general adult medical examination without abnormal findings: Secondary | ICD-10-CM

## 2022-05-09 MED ORDER — PREDNISONE 20 MG PO TABS: 20.0000 mg | ORAL_TABLET | Freq: Two times a day (BID) | ORAL | 0 refills | Status: DC

## 2022-05-09 MED ORDER — BUTALBITAL-ACETAMINOPHEN 50-325 MG PO TABS: 1.0000 | ORAL_TABLET | Freq: Three times a day (TID) | ORAL | 0 refills | Status: AC | PRN

## 2022-05-09 NOTE — Patient Instructions (Signed)
Preventive Care 40-49 Years Old, Female Preventive care refers to lifestyle choices and visits with your health care provider that can promote health and wellness. Preventive care visits are also called wellness exams. What can I expect for my preventive care visit? Counseling Your health care provider may ask you questions about your: Medical history, including: Past medical problems. Family medical history. Pregnancy history. Current health, including: Menstrual cycle. Method of birth control. Emotional well-being. Home life and relationship well-being. Sexual activity and sexual health. Lifestyle, including: Alcohol, nicotine or tobacco, and drug use. Access to firearms. Diet, exercise, and sleep habits. Work and work environment. Sunscreen use. Safety issues such as seatbelt and bike helmet use. Physical exam Your health care provider will check your: Height and weight. These may be used to calculate your BMI (body mass index). BMI is a measurement that tells if you are at a healthy weight. Waist circumference. This measures the distance around your waistline. This measurement also tells if you are at a healthy weight and may help predict your risk of certain diseases, such as type 2 diabetes and high blood pressure. Heart rate and blood pressure. Body temperature. Skin for abnormal spots. What immunizations do I need?  Vaccines are usually given at various ages, according to a schedule. Your health care provider will recommend vaccines for you based on your age, medical history, and lifestyle or other factors, such as travel or where you work. What tests do I need? Screening Your health care provider may recommend screening tests for certain conditions. This may include: Lipid and cholesterol levels. Diabetes screening. This is done by checking your blood sugar (glucose) after you have not eaten for a while (fasting). Pelvic exam and Pap test. Hepatitis B test. Hepatitis C  test. HIV (human immunodeficiency virus) test. STI (sexually transmitted infection) testing, if you are at risk. Lung cancer screening. Colorectal cancer screening. Mammogram. Talk with your health care provider about when you should start having regular mammograms. This may depend on whether you have a family history of breast cancer. BRCA-related cancer screening. This may be done if you have a family history of breast, ovarian, tubal, or peritoneal cancers. Bone density scan. This is done to screen for osteoporosis. Talk with your health care provider about your test results, treatment options, and if necessary, the need for more tests. Follow these instructions at home: Eating and drinking  Eat a diet that includes fresh fruits and vegetables, whole grains, lean protein, and low-fat dairy products. Take vitamin and mineral supplements as recommended by your health care provider. Do not drink alcohol if: Your health care provider tells you not to drink. You are pregnant, may be pregnant, or are planning to become pregnant. If you drink alcohol: Limit how much you have to 0-1 drink a day. Know how much alcohol is in your drink. In the [place] one drink equals one 12 oz bottle of beer (355 mL), one 5 oz glass of wine (148 mL), or one 1 oz glass of hard liquor (44 mL). Lifestyle Brush your teeth every morning and night with fluoride toothpaste. Floss one time each day. Exercise for at least 30 minutes 5 or more days each week. Do not use any products that contain nicotine or tobacco. These products include cigarettes, chewing tobacco, and vaping devices, such as e-cigarettes. If you need help quitting, ask your health care provider. Do not use drugs. If you are sexually active, practice safe sex. Use a condom or other form of protection to   prevent STIs. If you do not wish to become pregnant, use a form of birth control. If you plan to become pregnant, [place] your health care provider for a  prepregnancy visit. Take aspirin only as told by your health care provider. Make sure that you understand how much to take and what form to take. Work with your health care provider to find out whether it is safe and beneficial for you to take aspirin daily. Find healthy ways to manage stress, such as: Meditation, yoga, or listening to music. Journaling. Talking to a trusted person. Spending time with friends and family. Minimize exposure to UV radiation to reduce your risk of skin cancer. Safety Always wear your seat belt while driving or riding in a vehicle. Do not drive: If you have been drinking alcohol. Do not ride with someone who has been drinking. When you are tired or distracted. While texting. If you have been using any mind-altering substances or drugs. Wear a helmet and other protective equipment during sports activities. If you have firearms in your house, make sure you follow all gun safety procedures. Seek help if you have been physically or sexually abused. What's next? Visit your health care provider once a year for an annual wellness visit. Ask your health care provider how often you should have your eyes and teeth checked. Stay up to date on all vaccines. This information is not intended to replace advice given to you by your health care provider. Make sure you discuss any questions you have with your health care provider. Document Revised: 07/29/2020 Document Reviewed: 07/29/2020 Elsevier Patient Education  2023 [employer]  

## 2022-05-09 NOTE — Assessment & Plan Note (Signed)
Has had trouble off and on for about 3 years but recently it has flared again. 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 She is referred to sports medicine for further consideration.

## 2022-05-10 LAB — CBC WITH DIFFERENTIAL/PLATELET
Basophils Absolute: 0 10*3/uL (ref 0.0–0.1)
Basophils Relative: 0.8 % (ref 0.0–3.0)
Eosinophils Absolute: 0.2 10*3/uL (ref 0.0–0.7)
Eosinophils Relative: 2.9 % (ref 0.0–5.0)
HCT: 35.3 % — ABNORMAL LOW (ref 36.0–46.0)
Hemoglobin: 12 g/dL (ref 12.0–15.0)
Lymphocytes Relative: 37 % (ref 12.0–46.0)
Lymphs Abs: 1.9 10*3/uL (ref 0.7–4.0)
MCHC: 33.9 g/dL (ref 30.0–36.0)
MCV: 92.7 fl (ref 78.0–100.0)
Monocytes Absolute: 0.4 10*3/uL (ref 0.1–1.0)
Monocytes Relative: 6.9 % (ref 3.0–12.0)
Neutro Abs: 2.7 10*3/uL (ref 1.4–7.7)
Neutrophils Relative %: 52.4 % (ref 43.0–77.0)
Platelets: 307 10*3/uL (ref 150.0–400.0)
RBC: 3.81 Mil/uL — ABNORMAL LOW (ref 3.87–5.11)
RDW: 12.4 % (ref 11.5–15.5)
WBC: 5.2 10*3/uL (ref 4.0–10.5)

## 2022-05-10 LAB — COMPREHENSIVE METABOLIC PANEL
ALT: 13 U/L (ref 0–35)
AST: 18 U/L (ref 0–37)
Albumin: 4.4 g/dL (ref 3.5–5.2)
Alkaline Phosphatase: 45 U/L (ref 39–117)
BUN: 9 mg/dL (ref 6–23)
CO2: 29 mEq/L (ref 19–32)
Calcium: 9 mg/dL (ref 8.4–10.5)
Chloride: 101 mEq/L (ref 96–112)
Creatinine, Ser: 0.58 mg/dL (ref 0.40–1.20)
GFR: 107 mL/min (ref >60.00)
Glucose, Bld: 88 mg/dL (ref 70–99)
Potassium: 3.7 mEq/L (ref 3.5–5.1)
Sodium: 139 mEq/L (ref 135–145)
Total Bilirubin: 0.4 mg/dL (ref 0.2–1.2)
Total Protein: 7.3 g/dL (ref 6.0–8.3)

## 2022-05-10 LAB — LIPID PANEL
Cholesterol: 201 mg/dL — ABNORMAL HIGH (ref 0–200)
HDL: 70.4 mg/dL (ref >39.00)
LDL Cholesterol: 117 mg/dL — ABNORMAL HIGH (ref 0–99)
NonHDL: 130.98
Total CHOL/HDL Ratio: 3
Triglycerides: 72 mg/dL (ref 0.0–149.0)
VLDL: 14.4 mg/dL (ref 0.0–40.0)

## 2022-05-10 LAB — TSH: TSH: 3.08 u[IU]/mL (ref 0.35–5.50)

## 2022-05-11 ENCOUNTER — Encounter: Payer: Self-pay | Admitting: Family Medicine

## 2022-05-11 ENCOUNTER — Other Ambulatory Visit: Payer: Self-pay | Admitting: Family Medicine

## 2022-05-11 MED ORDER — METHOCARBAMOL 500 MG PO TABS: 500.0000 mg | ORAL_TABLET | Freq: Three times a day (TID) | ORAL | 2 refills | Status: AC | PRN

## 2022-05-16 NOTE — Progress Notes (Unsigned)
    Benito Mccreedy D.[place] Phone: [phone]   Assessment and Plan:     There are no diagnoses linked to this encounter.  ***   Pertinent previous records reviewed include ***   Follow Up: ***     Subjective:   I, Chanetta Moosman, am serving as a Education administrator for Doctor Glennon Mac  Chief Complaint: right shoulder pain   HPI:   05/17/2022 Patient is a 47 year old female complaining of right shoulder pain. Patient states  Relevant Historical Information: ***  Additional pertinent review of systems negative.   Current Outpatient Medications:    ACETAMINOPHEN-BUTALBITAL 50-325 MG TABS, Take 1 tablet by mouth 3 (three) times daily as needed., Disp: 120 tablet, Rfl: 0   ALPRAZolam (XANAX) 0.25 MG tablet, Take 1 tablet (0.25 mg total) by mouth daily as needed for anxiety. Do not take until after arrival to office., Disp: 2 tablet, Rfl: 0   aspirin 325 MG tablet, Take 325 mg by mouth daily., Disp: , Rfl:    atenolol (TENORMIN) 25 MG tablet, Take 1 tablet by mouth once daily, Disp: 90 tablet, Rfl: 1   budesonide (RHINOCORT AQUA) 32 MCG/ACT nasal spray, Place 2 sprays into both nostrils daily., Disp: 8.6 g, Rfl: 2   diltiazem (CARDIZEM) 30 MG tablet, TAKE 1 TABLET 2 TIMES DAILY MAY TAKE 1 TAB EVERY 4-6 HRS AS NEEDED FOR SUSTAINED HR >100 BPM., Disp: 240 tablet, Rfl: 1   hydrochlorothiazide (HYDRODIURIL) 12.5 MG tablet, Take 12.5 mg by mouth daily., Disp: , Rfl:    hydrocortisone 2.5 % ointment, Apply topically 2 (two) times daily., Disp: , Rfl:    levalbuterol (XOPENEX HFA) 45 MCG/ACT inhaler, Inhale 1-2 puffs into the lungs every 6 (six) hours as needed for wheezing., Disp: 1 each, Rfl: 5   levothyroxine (SYNTHROID) 25 MCG tablet, Take 1 tablet (25 mcg total) by mouth daily before breakfast., Disp: 90 tablet, Rfl: 1   meloxicam (MOBIC) 15 MG tablet, Take 1 tablet (15 mg total) by mouth daily as needed for  pain., Disp: 30 tablet, Rfl: 2   methocarbamol (ROBAXIN) 500 MG tablet, Take 1 tablet (500 mg total) by mouth every 8 (eight) hours as needed., Disp: 90 tablet, Rfl: 2   mometasone (ELOCON) 0.1 % cream, Apply topically daily., Disp: 50 g, Rfl: 1   montelukast (SINGULAIR) 10 MG tablet, TAKE 1 TABLET BY MOUTH EVERYDAY AT BEDTIME, Disp: 90 tablet, Rfl: 1   omeprazole (PRILOSEC) 40 MG capsule, TAKE 1 CAPSULE (40 MG TOTAL) BY MOUTH DAILY., Disp: 90 capsule, Rfl: 1   predniSONE (DELTASONE) 20 MG tablet, Take 1 tablet (20 mg total) by mouth 2 (two) times daily with a meal., Disp: 10 tablet, Rfl: 0   triamcinolone cream (KENALOG) 0.1 %, APPLY TOPICALLY 2 (TWO) TIMES DAILY. APPLY TOPICALLY, Disp: 30 g, Rfl: 3   Objective:     There were no vitals filed for this visit.    There is no height or weight on file to calculate BMI.    Physical Exam:    ***   Electronically signed by:  Benito Mccreedy D.[facility] 7:29 AM 05/16/22

## 2022-05-17 ENCOUNTER — Ambulatory Visit (INDEPENDENT_AMBULATORY_CARE_PROVIDER_SITE_OTHER): Payer: BC Managed Care – PPO

## 2022-05-17 ENCOUNTER — Ambulatory Visit: Payer: BC Managed Care – PPO | Admitting: Sports Medicine

## 2022-05-17 VITALS — BP 118/78 | HR 61 | Ht 61.0 in | Wt 128.0 lb

## 2022-05-17 DIAGNOSIS — M7551 Bursitis of right shoulder: Secondary | ICD-10-CM

## 2022-05-17 DIAGNOSIS — M25511 Pain in right shoulder: Secondary | ICD-10-CM | POA: Diagnosis not present

## 2022-05-17 DIAGNOSIS — G8929 Other chronic pain: Secondary | ICD-10-CM | POA: Diagnosis not present

## 2022-05-17 MED ORDER — MELOXICAM 7.5 MG PO TABS: 7.5000 mg | ORAL_TABLET | Freq: Every day | ORAL | 0 refills | Status: DC

## 2022-05-17 NOTE — Patient Instructions (Addendum)
Good to [place] you  Shoulder HEP  - Start meloxicam 7.5 mg daily x2 weeks.  If still having pain after 2 weeks, complete 3rd-week of meloxicam. May use remaining meloxicam as needed once daily for pain control.  Do not to use additional NSAIDs while taking meloxicam.  May use Tylenol [phone] mg 2 to 3 times a day for breakthrough pain.  Recommend taking with Prilosec and after eating  If you cannot tolerate meloxicam , then you can take prednisone 20 mg 2x a day for 5 days  4 week follow up

## 2022-05-20 ENCOUNTER — Encounter: Payer: Self-pay | Admitting: Sports Medicine

## 2022-05-23 ENCOUNTER — Other Ambulatory Visit: Payer: Self-pay | Admitting: Sports Medicine

## 2022-05-23 MED ORDER — NAPROXEN 500 MG PO TABS: 500.0000 mg | ORAL_TABLET | Freq: Two times a day (BID) | ORAL | 0 refills | Status: DC

## 2022-05-23 NOTE — Progress Notes (Unsigned)
Can we prescribe naproxen 500 mg b.i.d. Patient should use this instead of meloxicam. 60 tablets. Zero refills.

## 2022-06-16 NOTE — Progress Notes (Signed)
April Jackson D.[facility] [address] [place] [address] Phone: [phone]   Assessment and Plan:     1. Chronic right shoulder pain 2. Subacromial bursitis of right shoulder joint  -Chronic with exacerbation, subsequent sports medicine visit - Still consistent with subacromial bursitis versus rotator cuff tendinopathy likely flared by patient's work - Patient could not tolerate NSAIDs including meloxicam or naproxen due to reflux symptoms, so she discontinued medication. - Patient elected to start prednisone 5 mg daily for 10 days.  Patient has history of SVT, so she prefers to use lower doses of prednisone to prevent triggers. - Offered subacromial CSI which patient declined at this visit, though could be considered at future visit if symptoms flare. - Continue HEP and start physical therapy.  Referral sent today  Pertinent previous records reviewed include none   Follow Up: 5 to 6 weeks for reevaluation.  If no improvement or worsening of symptoms, could consider subacromial CSI versus advanced imaging   Subjective:   I, April Jackson, am serving as a Neurosurgeon for Doctor Richardean Sale   Chief Complaint: right shoulder pain    HPI:    05/17/2022 Patient is a 47 year old female complaining of right shoulder pain. Patient states right shoulder pain with very decreased  ROM due to her pain. She has significant difficulty with rotating her shoulder, such as with reaching behind her back. She has been having intermittent pain for about 7-8 years , hx of back pain that has radiated . She is right arm dominate and it is restricting her work, no numbness or tingling , she has pain when she sleeps, tylenol for the pain and that helps , no radiating pain   06/17/2022 Patient states meloxicam and naproxen don't work for her stomach , pain in shoulder increased and has radiated down to her shoulder, she is not able to lift her arm , wants to know  what kind of exercises will help decrease her SVT flares , does have a cardio appointment next week      Relevant Historical Information: HTN, GERD  Additional pertinent review of systems negative.   Current Outpatient Medications:    ACETAMINOPHEN-BUTALBITAL 50-325 MG TABS, Take 1 tablet by mouth 3 (three) times daily as needed., Disp: 120 tablet, Rfl: 0   ALPRAZolam (XANAX) 0.25 MG tablet, Take 1 tablet (0.25 mg total) by mouth daily as needed for anxiety. Do not take until after arrival to office., Disp: 2 tablet, Rfl: 0   aspirin 325 MG tablet, Take 325 mg by mouth daily., Disp: , Rfl:    atenolol (TENORMIN) 25 MG tablet, Take 1 tablet by mouth once daily, Disp: 90 tablet, Rfl: 1   budesonide (RHINOCORT AQUA) 32 MCG/ACT nasal spray, Place 2 sprays into both nostrils daily., Disp: 8.6 g, Rfl: 2   diltiazem (CARDIZEM) 30 MG tablet, TAKE 1 TABLET 2 TIMES DAILY MAY TAKE 1 TAB EVERY 4-6 HRS AS NEEDED FOR SUSTAINED HR >100 BPM., Disp: 240 tablet, Rfl: 1   hydrochlorothiazide (HYDRODIURIL) 12.5 MG tablet, Take 12.5 mg by mouth daily., Disp: , Rfl:    hydrocortisone 2.5 % ointment, Apply topically 2 (two) times daily., Disp: , Rfl:    levalbuterol (XOPENEX HFA) 45 MCG/ACT inhaler, Inhale 1-2 puffs into the lungs every 6 (six) hours as needed for wheezing., Disp: 1 each, Rfl: 5   levothyroxine (SYNTHROID) 25 MCG tablet, Take 1 tablet (25 mcg total) by mouth daily before breakfast., Disp: 90  tablet, Rfl: 1   meloxicam (MOBIC) 15 MG tablet, Take 1 tablet (15 mg total) by mouth daily as needed for pain., Disp: 30 tablet, Rfl: 2   meloxicam (MOBIC) 7.5 MG tablet, Take 1 tablet (7.5 mg total) by mouth daily., Disp: 30 tablet, Rfl: 0   methocarbamol (ROBAXIN) 500 MG tablet, Take 1 tablet (500 mg total) by mouth every 8 (eight) hours as needed., Disp: 90 tablet, Rfl: 2   mometasone (ELOCON) 0.1 % cream, Apply topically daily., Disp: 50 g, Rfl: 1   montelukast (SINGULAIR) 10 MG tablet, TAKE 1 TABLET BY  MOUTH EVERYDAY AT BEDTIME, Disp: 90 tablet, Rfl: 1   naproxen (NAPROSYN) 500 MG tablet, Take 1 tablet (500 mg total) by mouth 2 (two) times daily with a meal., Disp: 60 tablet, Rfl: 0   omeprazole (PRILOSEC) 40 MG capsule, TAKE 1 CAPSULE (40 MG TOTAL) BY MOUTH DAILY., Disp: 90 capsule, Rfl: 1   predniSONE (DELTASONE) 20 MG tablet, Take 1 tablet (20 mg total) by mouth 2 (two) times daily with a meal., Disp: 10 tablet, Rfl: 0   predniSONE (DELTASONE) 5 MG tablet, Take one tablet daily for the next 5 days., Disp: 10 tablet, Rfl: 0   triamcinolone cream (KENALOG) 0.1 %, APPLY TOPICALLY 2 (TWO) TIMES DAILY. APPLY TOPICALLY, Disp: 30 g, Rfl: 3   Objective:     Vitals:   06/17/22 0956  BP: 108/78  Pulse: 71  SpO2: 97%  Weight: 130 lb (59 kg)  Height: 5\' 1"  (1.549 m)      Body mass index is 24.56 kg/m.    Physical Exam:    Gen: Appears well, nad, nontoxic and pleasant Neuro:sensation intact, strength is 5/5 with df/pf/inv/ev, muscle tone wnl Skin: no suspicious lesion or defmority Psych: A&O, appropriate mood and affect   Right shoulder:  No deformity, swelling or muscle wasting No scapular winging FF 160, abd 90, int 20, ext 80 TTP deltoid NTTP over the [place], clavicle, ac, coracoid, biceps groove, humerus,  , trapezius, cervical spine Positive neer, hawkins, empty can, obriens, crossarm, subscap liftoff, speeds Neg ant drawer, sulcus sign, apprehension Negative Spurling's test bilat FROM of neck     Electronically signed by:  April Jackson D.[facility] 10:19 AM 06/17/22

## 2022-06-17 ENCOUNTER — Ambulatory Visit: Payer: BC Managed Care – PPO | Admitting: Sports Medicine

## 2022-06-17 VITALS — BP 108/78 | HR 71 | Ht 61.0 in | Wt 130.0 lb

## 2022-06-17 DIAGNOSIS — G8929 Other chronic pain: Secondary | ICD-10-CM

## 2022-06-17 DIAGNOSIS — M7551 Bursitis of right shoulder: Secondary | ICD-10-CM

## 2022-06-17 DIAGNOSIS — M25511 Pain in right shoulder: Secondary | ICD-10-CM | POA: Diagnosis not present

## 2022-06-17 MED ORDER — PREDNISONE 5 MG PO TABS: ORAL_TABLET | ORAL | 0 refills | Status: DC

## 2022-06-17 NOTE — Patient Instructions (Addendum)
Pt referral  Prednisone 5 mg daily for 10 days

## 2022-06-24 DIAGNOSIS — G4733 Obstructive sleep apnea (adult) (pediatric): Secondary | ICD-10-CM | POA: Diagnosis not present

## 2022-06-24 DIAGNOSIS — R002 Palpitations: Secondary | ICD-10-CM | POA: Diagnosis not present

## 2022-06-24 DIAGNOSIS — Z9889 Other specified postprocedural states: Secondary | ICD-10-CM | POA: Diagnosis not present

## 2022-06-24 DIAGNOSIS — E782 Mixed hyperlipidemia: Secondary | ICD-10-CM | POA: Diagnosis not present

## 2022-06-30 IMAGING — MG MM DIGITAL DIAGNOSTIC UNILAT*R* W/ TOMO W/ CAD
6 series · 6 of 18 positions shown · non-contrast
Comparison: Previous exam(s).
COMPARISON: Previous exam(s).

Addendum:
CLINICAL DATA: The patient was called back a right breast mass.

EXAM:
DIGITAL DIAGNOSTIC UNILATERAL RIGHT MAMMOGRAM WITH TOMOSYNTHESIS AND
CAD
TECHNIQUE: Right digital diagnostic mammography and breast tomosynthesis was
performed. The images were evaluated with computer-aided detection.

[R CC synth-2D (1 of 2)]
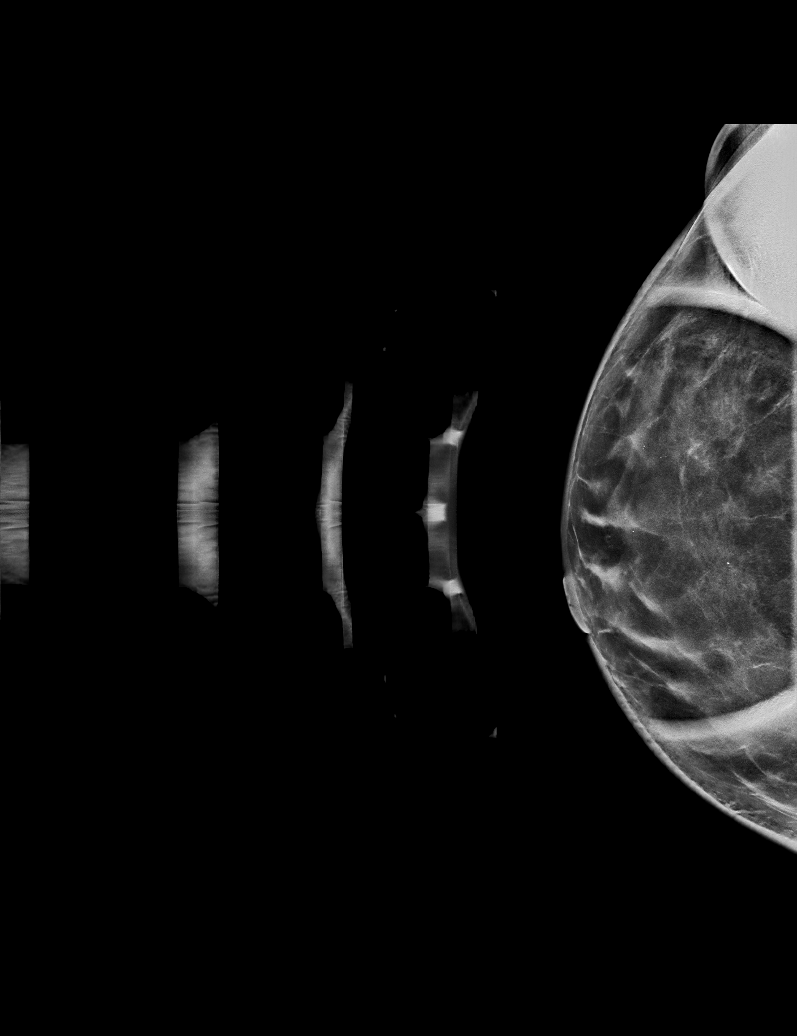

[R CC synth-2D (2 of 2)]
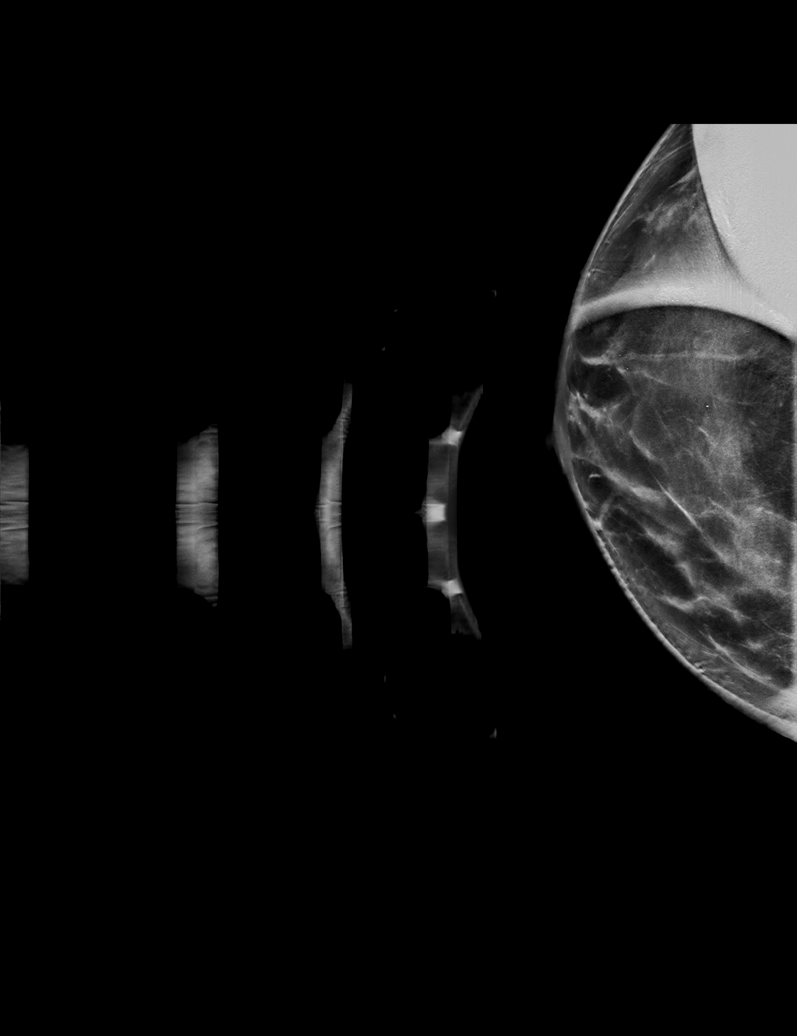

[R MLO synth-2D]
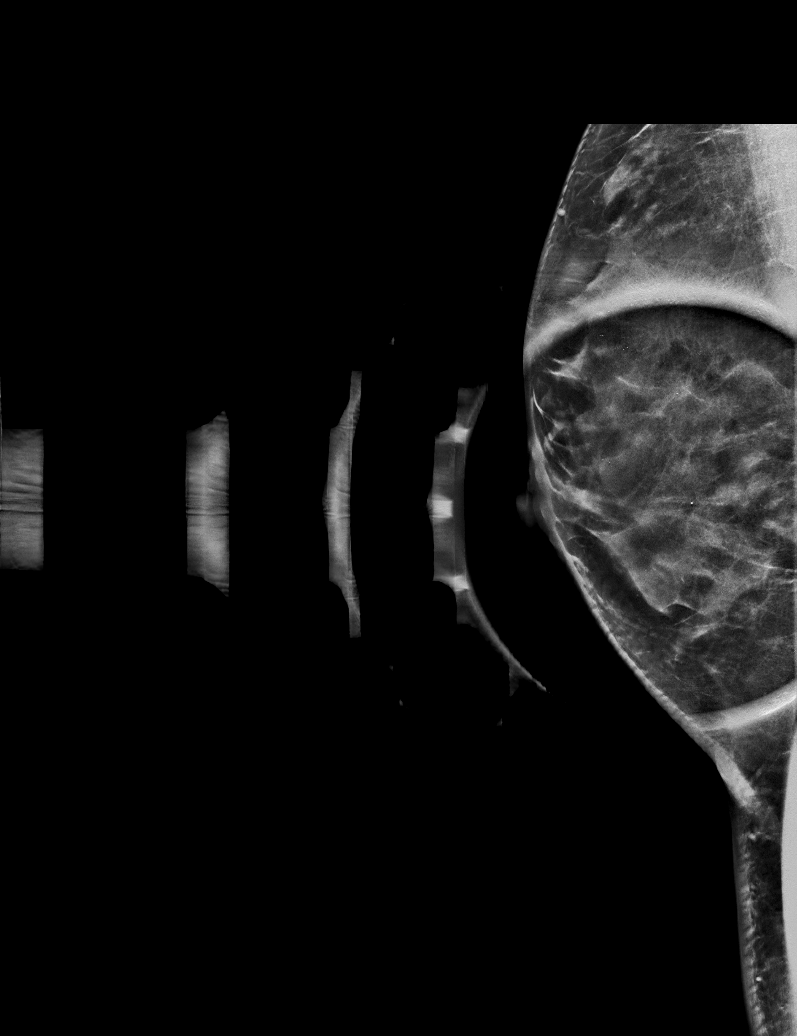

[R CC tomo (1 of 2) · tomo slice 37/72.0]
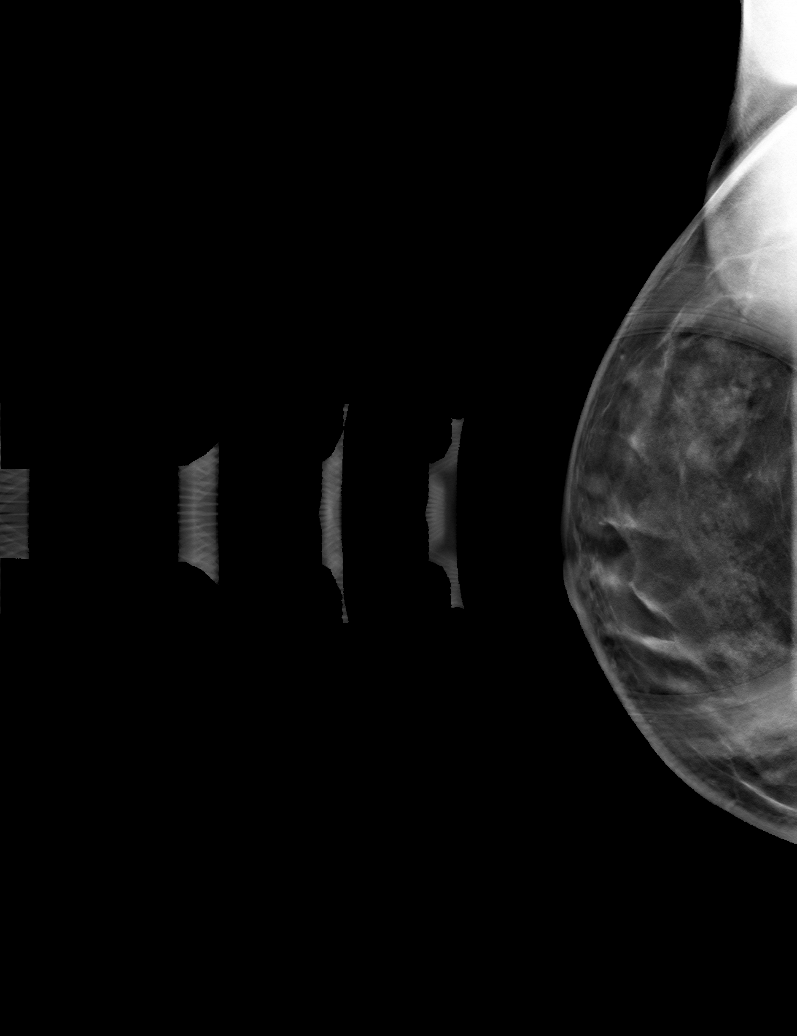

[R MLO tomo · tomo slice 33/64.0]
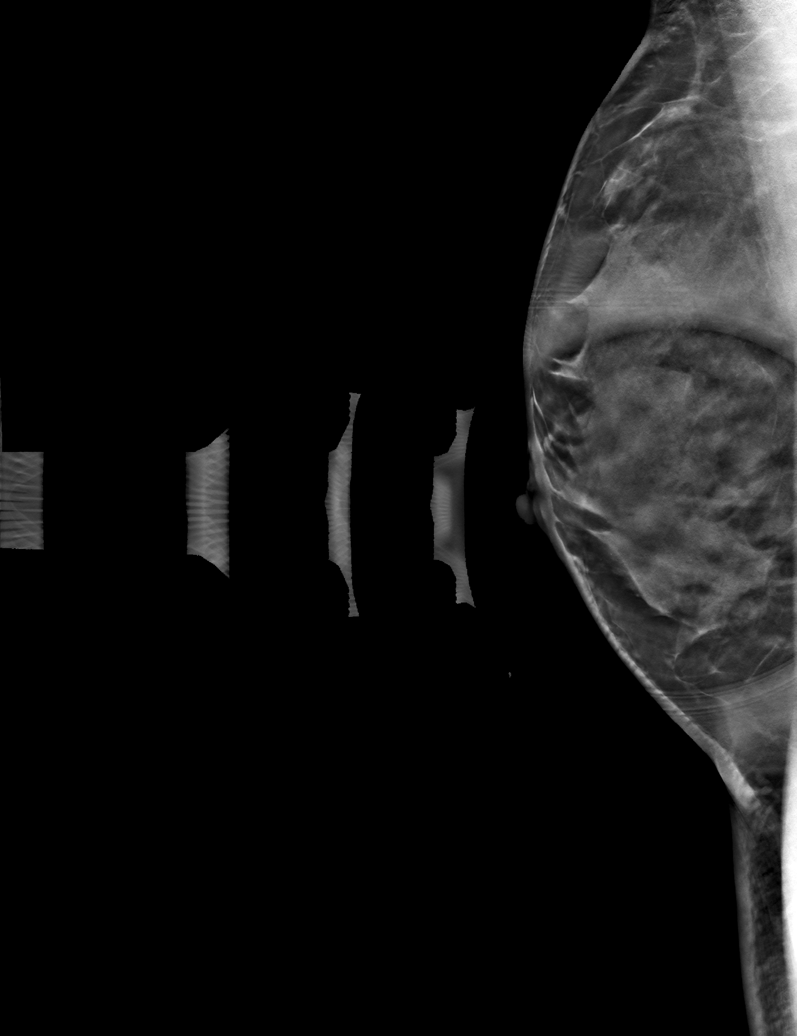

[R CC tomo (2 of 2) · tomo slice 37/73.0]
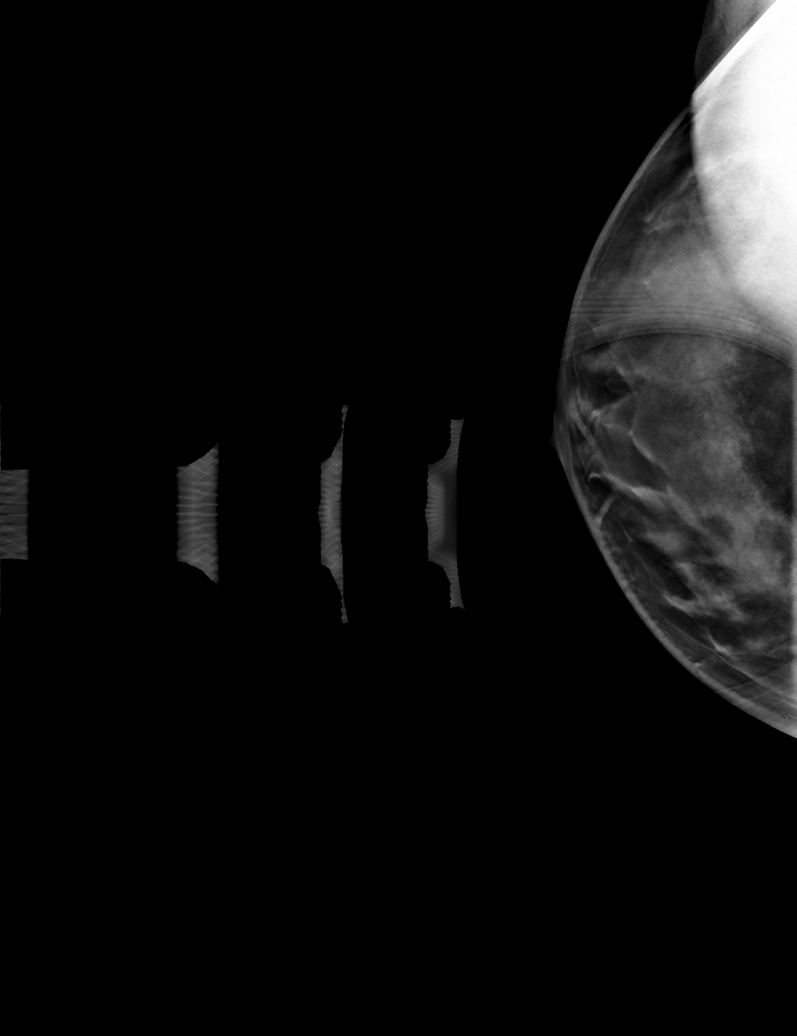

[6 of 18 positions shown; findings below may reference images not displayed]

ACR Breast Density Category c: The breast tissue is heterogeneously
dense, which may obscure small masses.
FINDINGS: Appear to be multiple masses in the lower outer right breast

On physical exam, no suspicious lumps are identified.

Targeted ultrasound is performed, showing fibrocystic changes right
breast at 8 o'clock accounting for mammographically identified
masses.
IMPRESSION: Fibrocystic changes.  No evidence of malignancy.

RECOMMENDATION:
Annual screening mammography.

I have discussed the findings and recommendations with the patient.
If applicable, a reminder letter will be sent to the patient
regarding the next appointment.

BI-RADS CATEGORY  2: Benign.

ADDENDUM:
A right-sided ultrasound was performed.

*** End of Addendum ***
ACR Breast Density Category c: The breast tissue is heterogeneously
dense, which may obscure small masses.
FINDINGS: Appear to be multiple masses in the lower outer right breast

On physical exam, no suspicious lumps are identified.

Targeted ultrasound is performed, showing fibrocystic changes right
breast at 8 o'clock accounting for mammographically identified
masses.
IMPRESSION: Fibrocystic changes.  No evidence of malignancy.

RECOMMENDATION:
Annual screening mammography.

I have discussed the findings and recommendations with the patient.
If applicable, a reminder letter will be sent to the patient
regarding the next appointment.

BI-RADS CATEGORY  2: Benign.

## 2022-07-08 DIAGNOSIS — M6281 Muscle weakness (generalized): Secondary | ICD-10-CM | POA: Diagnosis not present

## 2022-07-08 DIAGNOSIS — M25511 Pain in right shoulder: Secondary | ICD-10-CM | POA: Diagnosis not present

## 2022-07-13 DIAGNOSIS — M799 Soft tissue disorder, unspecified: Secondary | ICD-10-CM | POA: Diagnosis not present

## 2022-07-13 DIAGNOSIS — M6281 Muscle weakness (generalized): Secondary | ICD-10-CM | POA: Diagnosis not present

## 2022-07-13 DIAGNOSIS — M25511 Pain in right shoulder: Secondary | ICD-10-CM | POA: Diagnosis not present

## 2022-07-15 DIAGNOSIS — M799 Soft tissue disorder, unspecified: Secondary | ICD-10-CM | POA: Diagnosis not present

## 2022-07-15 DIAGNOSIS — M6281 Muscle weakness (generalized): Secondary | ICD-10-CM | POA: Diagnosis not present

## 2022-07-15 DIAGNOSIS — M25511 Pain in right shoulder: Secondary | ICD-10-CM | POA: Diagnosis not present

## 2022-07-20 DIAGNOSIS — M6281 Muscle weakness (generalized): Secondary | ICD-10-CM | POA: Diagnosis not present

## 2022-07-20 DIAGNOSIS — M25511 Pain in right shoulder: Secondary | ICD-10-CM | POA: Diagnosis not present

## 2022-07-20 DIAGNOSIS — M799 Soft tissue disorder, unspecified: Secondary | ICD-10-CM | POA: Diagnosis not present

## 2022-07-22 DIAGNOSIS — M25511 Pain in right shoulder: Secondary | ICD-10-CM | POA: Diagnosis not present

## 2022-07-22 DIAGNOSIS — M799 Soft tissue disorder, unspecified: Secondary | ICD-10-CM | POA: Diagnosis not present

## 2022-07-22 DIAGNOSIS — M6281 Muscle weakness (generalized): Secondary | ICD-10-CM | POA: Diagnosis not present

## 2022-07-27 DIAGNOSIS — M799 Soft tissue disorder, unspecified: Secondary | ICD-10-CM | POA: Diagnosis not present

## 2022-07-27 DIAGNOSIS — M6281 Muscle weakness (generalized): Secondary | ICD-10-CM | POA: Diagnosis not present

## 2022-07-27 DIAGNOSIS — M25511 Pain in right shoulder: Secondary | ICD-10-CM | POA: Diagnosis not present

## 2022-07-28 NOTE — Progress Notes (Signed)
April Jackson D.[facility] [address] [place] [address] Phone: [phone]   Assessment and Plan:     1. Chronic right shoulder pain 2. Subacromial bursitis of right shoulder joint  -Chronic with exacerbation, subsequent visit - Still consistent with subacromial bursitis versus rotator cuff tendinopathy likely flared by patient's work - Patient has had some improvement with physical therapy and Tylenol, however she still experiences daily symptoms that will often wake her up at night - Patient cannot tolerate NSAIDs due to reflux symptoms and cannot tolerate prednisone due to history of SVT and preference of avoiding steroids - Offered subacromial CSI which patient declined at this visit, though could be considered at follow-up visit if no improvement - Continue HEP and physical therapy - Start Tylenol 500 to 1000 mg tablets 2-3 times a day for day-to-day pain relief - Start Voltaren gel topically over areas of pain  Pertinent previous records reviewed include none   Follow Up: 4 to 6 weeks for reevaluation.  Would consider subacromial CSI if no improvement   Subjective:   I, April Jackson, am serving as a Neurosurgeon for Doctor Richardean Sale   Chief Complaint: right shoulder pain    HPI:    05/17/2022 Patient is a 47 year old female complaining of right shoulder pain. Patient states right shoulder pain with very decreased  ROM due to her pain. She has significant difficulty with rotating her shoulder, such as with reaching behind her back. She has been having intermittent pain for about 7-8 years , hx of back pain that has radiated . She is right arm dominate and it is restricting her work, no numbness or tingling , she has pain when she sleeps, tylenol for the pain and that helps , no radiating pain    06/17/2022 Patient states meloxicam and naproxen don't work for her stomach , pain in shoulder increased and has radiated down to her  shoulder, she is not able to lift her arm , wants to know what kind of exercises will help decrease her SVT flares , does have a cardio appointment next week    07/29/2022 Patient states ibu and naproxen doesn't help, tylenol is the only thing that helps right now. Pain is going down to 30-40%    Relevant Historical Information: HTN, GERD  Additional pertinent review of systems negative.   Current Outpatient Medications:    ACETAMINOPHEN-BUTALBITAL 50-325 MG TABS, Take 1 tablet by mouth 3 (three) times daily as needed., Disp: 120 tablet, Rfl: 0   ALPRAZolam (XANAX) 0.25 MG tablet, Take 1 tablet (0.25 mg total) by mouth daily as needed for anxiety. Do not take until after arrival to office., Disp: 2 tablet, Rfl: 0   aspirin 325 MG tablet, Take 325 mg by mouth daily., Disp: , Rfl:    atenolol (TENORMIN) 25 MG tablet, Take 1 tablet by mouth once daily, Disp: 90 tablet, Rfl: 1   budesonide (RHINOCORT AQUA) 32 MCG/ACT nasal spray, Place 2 sprays into both nostrils daily., Disp: 8.6 g, Rfl: 2   diltiazem (CARDIZEM) 30 MG tablet, TAKE 1 TABLET 2 TIMES DAILY MAY TAKE 1 TAB EVERY 4-6 HRS AS NEEDED FOR SUSTAINED HR >100 BPM., Disp: 240 tablet, Rfl: 1   hydrochlorothiazide (HYDRODIURIL) 12.5 MG tablet, Take 12.5 mg by mouth daily., Disp: , Rfl:    hydrocortisone 2.5 % ointment, Apply topically 2 (two) times daily., Disp: , Rfl:    levalbuterol (XOPENEX HFA) 45 MCG/ACT inhaler, Inhale 1-2 puffs  into the lungs every 6 (six) hours as needed for wheezing., Disp: 1 each, Rfl: 5   levothyroxine (SYNTHROID) 25 MCG tablet, Take 1 tablet (25 mcg total) by mouth daily before breakfast., Disp: 90 tablet, Rfl: 1   meloxicam (MOBIC) 15 MG tablet, Take 1 tablet (15 mg total) by mouth daily as needed for pain., Disp: 30 tablet, Rfl: 2   meloxicam (MOBIC) 7.5 MG tablet, Take 1 tablet (7.5 mg total) by mouth daily., Disp: 30 tablet, Rfl: 0   methocarbamol (ROBAXIN) 500 MG tablet, Take 1 tablet (500 mg total) by mouth  every 8 (eight) hours as needed., Disp: 90 tablet, Rfl: 2   mometasone (ELOCON) 0.1 % cream, Apply topically daily., Disp: 50 g, Rfl: 1   montelukast (SINGULAIR) 10 MG tablet, TAKE 1 TABLET BY MOUTH EVERYDAY AT BEDTIME, Disp: 90 tablet, Rfl: 1   naproxen (NAPROSYN) 500 MG tablet, Take 1 tablet (500 mg total) by mouth 2 (two) times daily with a meal., Disp: 60 tablet, Rfl: 0   omeprazole (PRILOSEC) 40 MG capsule, TAKE 1 CAPSULE (40 MG TOTAL) BY MOUTH DAILY., Disp: 90 capsule, Rfl: 1   predniSONE (DELTASONE) 20 MG tablet, Take 1 tablet (20 mg total) by mouth 2 (two) times daily with a meal., Disp: 10 tablet, Rfl: 0   predniSONE (DELTASONE) 5 MG tablet, Take one tablet daily for the next 5 days., Disp: 10 tablet, Rfl: 0   triamcinolone cream (KENALOG) 0.1 %, APPLY TOPICALLY 2 (TWO) TIMES DAILY. APPLY TOPICALLY, Disp: 30 g, Rfl: 3   Objective:     Vitals:   07/29/22 0942  BP: 110/80  Pulse: 76  SpO2: 98%  Weight: 130 lb (59 kg)  Height: 5\' 1"  (1.549 m)      Body mass index is 24.56 kg/m.    Physical Exam:    Gen: Appears well, nad, nontoxic and pleasant Neuro:sensation intact, strength is 5/5 with df/pf/inv/ev, muscle tone wnl Skin: no suspicious lesion or defmority Psych: A&O, appropriate mood and affect   Right shoulder:  No deformity, swelling or muscle wasting No scapular winging FF 160, abd 90, int 20, ext 80 TTP deltoid NTTP over the [place], clavicle, ac, coracoid, biceps groove, humerus,  , trapezius, cervical spine Positive neer, hawkins, empty can, obriens, crossarm, subscap liftoff, speeds Neg ant drawer, sulcus sign, apprehension Negative Spurling's test bilat FROM of neck     Electronically signed by:  April Jackson D.[facility] 10:04 AM 07/29/22

## 2022-07-29 ENCOUNTER — Ambulatory Visit: Payer: BC Managed Care – PPO | Admitting: Sports Medicine

## 2022-07-29 VITALS — BP 110/80 | HR 76 | Ht 61.0 in | Wt 130.0 lb

## 2022-07-29 DIAGNOSIS — G8929 Other chronic pain: Secondary | ICD-10-CM | POA: Diagnosis not present

## 2022-07-29 DIAGNOSIS — M25511 Pain in right shoulder: Secondary | ICD-10-CM

## 2022-07-29 DIAGNOSIS — M7551 Bursitis of right shoulder: Secondary | ICD-10-CM

## 2022-07-29 NOTE — Patient Instructions (Signed)
Continue PT  Continue HEP  Recommend using voltaren gel over areas of pain  Tylenol [phone] mg 2-3 times a day for pain relief  4-6 week follow up

## 2022-08-15 DIAGNOSIS — M25511 Pain in right shoulder: Secondary | ICD-10-CM | POA: Diagnosis not present

## 2022-08-15 DIAGNOSIS — M799 Soft tissue disorder, unspecified: Secondary | ICD-10-CM | POA: Diagnosis not present

## 2022-08-15 DIAGNOSIS — M6281 Muscle weakness (generalized): Secondary | ICD-10-CM | POA: Diagnosis not present

## 2022-08-25 NOTE — Progress Notes (Signed)
April Jackson D.[facility] [address] [place] [address] Phone: [phone]   Assessment and Plan:     1. Chronic right shoulder pain 2. Subacromial bursitis of right shoulder joint  -Chronic with exacerbation, subsequent visit - Continued right shoulder pain most consistent with subacromial bursitis versus rotator cuff tendinopathy that has had mild improvement with HEP and physical therapy, but still limited ROM and daily pain -Recommend continuing HEP and physical therapy - Continue Tylenol for day-to-day pain relief - Patient cannot tolerate NSAIDs due to reflux symptoms and cannot tolerate prednisone due to history of SVT and preference of avoiding steroids - Patient elected for subacromial CSI.  Tolerated well per note below - May use turmeric and tart cherry to decrease overall inflammation  Procedure: Subacromial Injection Side: Right  Risks explained and consent was given verbally. The site was cleaned with alcohol prep. A steroid injection was performed from posterior approach using 2mL of 1% lidocaine without epinephrine and 1mL of kenalog 40mg /ml. This was well tolerated and resulted in symptomatic relief.  Needle was removed, hemostasis achieved, and post injection instructions were explained.   Pt was advised to call or return to clinic if these symptoms worsen or fail to improve as anticipated.   Pertinent previous records reviewed include none   Follow Up: 4 weeks for reevaluation.  Could consider advanced imaging versus alternative CSI if no improvement or worsening of symptoms   Subjective:   I, April Jackson, am serving as a Neurosurgeon for Doctor Richardean Sale   Chief Complaint: right shoulder pain    HPI:    05/17/2022 Patient is a 47 year old female complaining of right shoulder pain. Patient states right shoulder pain with very decreased  ROM due to her pain. She has significant difficulty with rotating her  shoulder, such as with reaching behind her back. She has been having intermittent pain for about 7-8 years , hx of back pain that has radiated . She is right arm dominate and it is restricting her work, no numbness or tingling , she has pain when she sleeps, tylenol for the pain and that helps , no radiating pain    06/17/2022 Patient states meloxicam and naproxen don't work for her stomach , pain in shoulder increased and has radiated down to her shoulder, she is not able to lift her arm , wants to know what kind of exercises will help decrease her SVT flares , does have a cardio appointment next week    07/29/2022 Patient states ibu and naproxen doesn't help, tylenol is the only thing that helps right now. Pain is going down to 30-40%   08/26/2022 Patient states so no improvement , she would like to know her diagnosis and wants to know if she has a torn rotator cuff   Relevant Historical Information: HTN, GERD  Additional pertinent review of systems negative.   Current Outpatient Medications:    ACETAMINOPHEN-BUTALBITAL 50-325 MG TABS, Take 1 tablet by mouth 3 (three) times daily as needed., Disp: 120 tablet, Rfl: 0   ALPRAZolam (XANAX) 0.25 MG tablet, Take 1 tablet (0.25 mg total) by mouth daily as needed for anxiety. Do not take until after arrival to office., Disp: 2 tablet, Rfl: 0   aspirin 325 MG tablet, Take 325 mg by mouth daily., Disp: , Rfl:    atenolol (TENORMIN) 25 MG tablet, Take 1 tablet by mouth once daily, Disp: 90 tablet, Rfl: 1   budesonide (RHINOCORT AQUA) 32  MCG/ACT nasal spray, Place 2 sprays into both nostrils daily., Disp: 8.6 g, Rfl: 2   diltiazem (CARDIZEM) 30 MG tablet, TAKE 1 TABLET 2 TIMES DAILY MAY TAKE 1 TAB EVERY 4-6 HRS AS NEEDED FOR SUSTAINED HR >100 BPM., Disp: 240 tablet, Rfl: 1   hydrochlorothiazide (HYDRODIURIL) 12.5 MG tablet, Take 12.5 mg by mouth daily., Disp: , Rfl:    hydrocortisone 2.5 % ointment, Apply topically 2 (two) times daily., Disp: , Rfl:     levalbuterol (XOPENEX HFA) 45 MCG/ACT inhaler, Inhale 1-2 puffs into the lungs every 6 (six) hours as needed for wheezing., Disp: 1 each, Rfl: 5   levothyroxine (SYNTHROID) 25 MCG tablet, Take 1 tablet (25 mcg total) by mouth daily before breakfast., Disp: 90 tablet, Rfl: 1   meloxicam (MOBIC) 15 MG tablet, Take 1 tablet (15 mg total) by mouth daily as needed for pain., Disp: 30 tablet, Rfl: 2   meloxicam (MOBIC) 7.5 MG tablet, Take 1 tablet (7.5 mg total) by mouth daily., Disp: 30 tablet, Rfl: 0   methocarbamol (ROBAXIN) 500 MG tablet, Take 1 tablet (500 mg total) by mouth every 8 (eight) hours as needed., Disp: 90 tablet, Rfl: 2   mometasone (ELOCON) 0.1 % cream, Apply topically daily., Disp: 50 g, Rfl: 1   montelukast (SINGULAIR) 10 MG tablet, TAKE 1 TABLET BY MOUTH EVERYDAY AT BEDTIME, Disp: 90 tablet, Rfl: 1   naproxen (NAPROSYN) 500 MG tablet, Take 1 tablet (500 mg total) by mouth 2 (two) times daily with a meal., Disp: 60 tablet, Rfl: 0   omeprazole (PRILOSEC) 40 MG capsule, TAKE 1 CAPSULE (40 MG TOTAL) BY MOUTH DAILY., Disp: 90 capsule, Rfl: 1   predniSONE (DELTASONE) 20 MG tablet, Take 1 tablet (20 mg total) by mouth 2 (two) times daily with a meal., Disp: 10 tablet, Rfl: 0   predniSONE (DELTASONE) 5 MG tablet, Take one tablet daily for the next 5 days., Disp: 10 tablet, Rfl: 0   triamcinolone cream (KENALOG) 0.1 %, APPLY TOPICALLY 2 (TWO) TIMES DAILY. APPLY TOPICALLY, Disp: 30 g, Rfl: 3   Objective:     Vitals:   08/26/22 1012  Pulse: 67  SpO2: 97%  Weight: 127 lb (57.6 kg)  Height: 5\' 1"  (1.549 m)      Body mass index is 24 kg/m.    Physical Exam:    Gen: Appears well, nad, nontoxic and pleasant Neuro:sensation intact, strength is 5/5 with df/pf/inv/ev, muscle tone wnl Skin: no suspicious lesion or defmority Psych: A&O, appropriate mood and affect   Right shoulder:  No deformity, swelling or muscle wasting No scapular winging FF 160, abd 110, int 10, ext 80 TTP  deltoid NTTP over the [place], clavicle, ac, coracoid, biceps groove, humerus,  , trapezius, cervical spine Positive neer, hawkins, empty can, obriens, crossarm, subscap liftoff, speeds Neg ant drawer, sulcus sign, apprehension Negative Spurling's test bilat FROM of neck     Electronically signed by:  April Jackson [facility] 10:39 AM 08/26/22

## 2022-08-26 ENCOUNTER — Ambulatory Visit: Payer: BC Managed Care – PPO | Admitting: Sports Medicine

## 2022-08-26 VITALS — HR 67 | Ht 61.0 in | Wt 127.0 lb

## 2022-08-26 DIAGNOSIS — M7551 Bursitis of right shoulder: Secondary | ICD-10-CM | POA: Diagnosis not present

## 2022-08-26 DIAGNOSIS — M25511 Pain in right shoulder: Secondary | ICD-10-CM

## 2022-08-26 DIAGNOSIS — G8929 Other chronic pain: Secondary | ICD-10-CM

## 2022-08-26 NOTE — Patient Instructions (Signed)
4 week follow up

## 2022-08-31 DIAGNOSIS — M799 Soft tissue disorder, unspecified: Secondary | ICD-10-CM | POA: Diagnosis not present

## 2022-08-31 DIAGNOSIS — M6281 Muscle weakness (generalized): Secondary | ICD-10-CM | POA: Diagnosis not present

## 2022-08-31 DIAGNOSIS — M25511 Pain in right shoulder: Secondary | ICD-10-CM | POA: Diagnosis not present

## 2022-09-07 DIAGNOSIS — M25511 Pain in right shoulder: Secondary | ICD-10-CM | POA: Diagnosis not present

## 2022-09-07 DIAGNOSIS — M799 Soft tissue disorder, unspecified: Secondary | ICD-10-CM | POA: Diagnosis not present

## 2022-09-07 DIAGNOSIS — M6281 Muscle weakness (generalized): Secondary | ICD-10-CM | POA: Diagnosis not present

## 2022-09-14 DIAGNOSIS — M25511 Pain in right shoulder: Secondary | ICD-10-CM | POA: Diagnosis not present

## 2022-09-14 DIAGNOSIS — M799 Soft tissue disorder, unspecified: Secondary | ICD-10-CM | POA: Diagnosis not present

## 2022-09-14 DIAGNOSIS — M6281 Muscle weakness (generalized): Secondary | ICD-10-CM | POA: Diagnosis not present

## 2022-09-21 DIAGNOSIS — M799 Soft tissue disorder, unspecified: Secondary | ICD-10-CM | POA: Diagnosis not present

## 2022-09-21 DIAGNOSIS — M6281 Muscle weakness (generalized): Secondary | ICD-10-CM | POA: Diagnosis not present

## 2022-09-21 DIAGNOSIS — M25511 Pain in right shoulder: Secondary | ICD-10-CM | POA: Diagnosis not present

## 2022-09-22 NOTE — Progress Notes (Signed)
April Jackson [facility] [address] [place] [address] Phone: [phone]   Assessment and Plan:     1. Chronic right shoulder pain 2. Subacromial bursitis of right shoulder joint  -Chronic with exacerbation, subsequent visit - Significant improvement in right shoulder pain after subacromial CSI at previous office visit on 08/26/2022 - Continue HEP and physical therapy as needed - Continue Tylenol for day-to-day pain relief - Patient cannot tolerate NSAIDs due to reflux symptoms and cannot tolerate prednisone due to history of SVT and preference of avoiding steroids - May continue to use turmeric and tart cherry decrease overall inflammation  Pertinent previous records reviewed include none   Follow Up: As needed if no improvement or worsening of symptoms.  Goal of 3 months minimum relief from subacromial CSI and could repeat after that time   Subjective:   I, April Jackson, am serving as a Neurosurgeon for Doctor Richardean Sale   Chief Complaint: right shoulder pain    HPI:    05/17/2022 Patient is a 47 year old female complaining of right shoulder pain. Patient states right shoulder pain with very decreased  ROM due to her pain. She has significant difficulty with rotating her shoulder, such as with reaching behind her back. She has been having intermittent pain for about 7-8 years , hx of back pain that has radiated . She is right arm dominate and it is restricting her work, no numbness or tingling , she has pain when she sleeps, tylenol for the pain and that helps , no radiating pain    06/17/2022 Patient states meloxicam and naproxen don't work for her stomach , pain in shoulder increased and has radiated down to her shoulder, she is not able to lift her arm , wants to know what kind of exercises will help decrease her SVT flares , does have a cardio appointment next week    07/29/2022 Patient states ibu and naproxen doesn't help,  tylenol is the only thing that helps right now. Pain is going down to 30-40%    08/26/2022 Patient states so no improvement , she would like to know her diagnosis and wants to know if she has a torn rotator cuff  09/23/2022 Patient states steroids work and her pain is now a 1-2/10   Relevant Historical Information: HTN, GERD  Additional pertinent review of systems negative.   Current Outpatient Medications:    ACETAMINOPHEN-BUTALBITAL 50-325 MG TABS, Take 1 tablet by mouth 3 (three) times daily as needed., Disp: 120 tablet, Rfl: 0   ALPRAZolam (XANAX) 0.25 MG tablet, Take 1 tablet (0.25 mg total) by mouth daily as needed for anxiety. Do not take until after arrival to office., Disp: 2 tablet, Rfl: 0   aspirin 325 MG tablet, Take 325 mg by mouth daily., Disp: , Rfl:    atenolol (TENORMIN) 25 MG tablet, Take 1 tablet by mouth once daily, Disp: 90 tablet, Rfl: 1   budesonide (RHINOCORT AQUA) 32 MCG/ACT nasal spray, Place 2 sprays into both nostrils daily., Disp: 8.6 g, Rfl: 2   diltiazem (CARDIZEM) 30 MG tablet, TAKE 1 TABLET 2 TIMES DAILY MAY TAKE 1 TAB EVERY 4-6 HRS AS NEEDED FOR SUSTAINED HR >100 BPM., Disp: 240 tablet, Rfl: 1   hydrochlorothiazide (HYDRODIURIL) 12.5 MG tablet, Take 12.5 mg by mouth daily., Disp: , Rfl:    hydrocortisone 2.5 % ointment, Apply topically 2 (two) times daily., Disp: , Rfl:    levalbuterol (XOPENEX HFA) 45 MCG/ACT inhaler,  Inhale 1-2 puffs into the lungs every 6 (six) hours as needed for wheezing., Disp: 1 each, Rfl: 5   levothyroxine (SYNTHROID) 25 MCG tablet, Take 1 tablet (25 mcg total) by mouth daily before breakfast., Disp: 90 tablet, Rfl: 1   meloxicam (MOBIC) 15 MG tablet, Take 1 tablet (15 mg total) by mouth daily as needed for pain., Disp: 30 tablet, Rfl: 2   meloxicam (MOBIC) 7.5 MG tablet, Take 1 tablet (7.5 mg total) by mouth daily., Disp: 30 tablet, Rfl: 0   methocarbamol (ROBAXIN) 500 MG tablet, Take 1 tablet (500 mg total) by mouth every 8 (eight)  hours as needed., Disp: 90 tablet, Rfl: 2   mometasone (ELOCON) 0.1 % cream, Apply topically daily., Disp: 50 g, Rfl: 1   montelukast (SINGULAIR) 10 MG tablet, TAKE 1 TABLET BY MOUTH EVERYDAY AT BEDTIME, Disp: 90 tablet, Rfl: 1   naproxen (NAPROSYN) 500 MG tablet, Take 1 tablet (500 mg total) by mouth 2 (two) times daily with a meal., Disp: 60 tablet, Rfl: 0   omeprazole (PRILOSEC) 40 MG capsule, TAKE 1 CAPSULE (40 MG TOTAL) BY MOUTH DAILY., Disp: 90 capsule, Rfl: 1   predniSONE (DELTASONE) 20 MG tablet, Take 1 tablet (20 mg total) by mouth 2 (two) times daily with a meal., Disp: 10 tablet, Rfl: 0   predniSONE (DELTASONE) 5 MG tablet, Take one tablet daily for the next 5 days., Disp: 10 tablet, Rfl: 0   triamcinolone cream (KENALOG) 0.1 %, APPLY TOPICALLY 2 (TWO) TIMES DAILY. APPLY TOPICALLY, Disp: 30 g, Rfl: 3   Objective:     Vitals:   09/23/22 0932  Pulse: 70  SpO2: 97%  Weight: 125 lb (56.7 kg)  Height: 5\' 1"  (1.549 m)      Body mass index is 23.62 kg/m.    Physical Exam:     Gen: Appears well, nad, nontoxic and pleasant Neuro:sensation intact, strength is 5/5 with df/pf/inv/ev, muscle tone wnl Skin: no suspicious lesion or defmority Psych: A&O, appropriate mood and affect  Right shoulder:  No deformity, swelling or muscle wasting No scapular winging FF 180, abd 180, int 0, ext 90 NTTP over the [place], clavicle, ac, coracoid, biceps groove, humerus, deltoid, trapezius, cervical spine Neg neer, hawkins, empty can, obriens, crossarm, subscap liftoff, speeds Neg ant drawer, sulcus sign, apprehension Negative Spurling's test bilat FROM of neck    Electronically signed by:  April Jackson [facility] 9:48 AM 09/23/22

## 2022-09-23 ENCOUNTER — Ambulatory Visit: Payer: BC Managed Care – PPO | Admitting: Sports Medicine

## 2022-09-23 VITALS — HR 70 | Ht 61.0 in | Wt 125.0 lb

## 2022-09-23 DIAGNOSIS — M7551 Bursitis of right shoulder: Secondary | ICD-10-CM

## 2022-09-23 DIAGNOSIS — G8929 Other chronic pain: Secondary | ICD-10-CM | POA: Diagnosis not present

## 2022-09-23 DIAGNOSIS — M25511 Pain in right shoulder: Secondary | ICD-10-CM

## 2022-09-28 DIAGNOSIS — M6281 Muscle weakness (generalized): Secondary | ICD-10-CM | POA: Diagnosis not present

## 2022-09-28 DIAGNOSIS — M799 Soft tissue disorder, unspecified: Secondary | ICD-10-CM | POA: Diagnosis not present

## 2022-09-28 DIAGNOSIS — M25511 Pain in right shoulder: Secondary | ICD-10-CM | POA: Diagnosis not present

## 2022-09-29 ENCOUNTER — Encounter (INDEPENDENT_AMBULATORY_CARE_PROVIDER_SITE_OTHER): Payer: Self-pay

## 2022-10-05 DIAGNOSIS — M25511 Pain in right shoulder: Secondary | ICD-10-CM | POA: Diagnosis not present

## 2022-10-05 DIAGNOSIS — M6281 Muscle weakness (generalized): Secondary | ICD-10-CM | POA: Diagnosis not present

## 2022-10-05 DIAGNOSIS — M799 Soft tissue disorder, unspecified: Secondary | ICD-10-CM | POA: Diagnosis not present

## 2022-10-12 DIAGNOSIS — M6281 Muscle weakness (generalized): Secondary | ICD-10-CM | POA: Diagnosis not present

## 2022-10-12 DIAGNOSIS — M25511 Pain in right shoulder: Secondary | ICD-10-CM | POA: Diagnosis not present

## 2022-10-12 DIAGNOSIS — M799 Soft tissue disorder, unspecified: Secondary | ICD-10-CM | POA: Diagnosis not present

## 2022-11-14 ENCOUNTER — Encounter: Payer: Self-pay | Admitting: Family Medicine

## 2022-11-14 ENCOUNTER — Ambulatory Visit (INDEPENDENT_AMBULATORY_CARE_PROVIDER_SITE_OTHER): Payer: BC Managed Care – PPO | Admitting: Family Medicine

## 2022-11-14 VITALS — BP 124/78 | HR 64 | Temp 98.0°F | Resp 16 | Ht 61.0 in | Wt 126.4 lb

## 2022-11-14 DIAGNOSIS — I10 Essential (primary) hypertension: Secondary | ICD-10-CM

## 2022-11-14 DIAGNOSIS — D539 Nutritional anemia, unspecified: Secondary | ICD-10-CM | POA: Diagnosis not present

## 2022-11-14 DIAGNOSIS — E782 Mixed hyperlipidemia: Secondary | ICD-10-CM

## 2022-11-14 DIAGNOSIS — K219 Gastro-esophageal reflux disease without esophagitis: Secondary | ICD-10-CM

## 2022-11-14 NOTE — Assessment & Plan Note (Signed)
Avoid offending foods, start probiotics. Do not eat large meals in late evening and consider raising head of bed.  

## 2022-11-14 NOTE — Assessment & Plan Note (Signed)
Well controlled, no changes to meds. Encouraged heart healthy diet such as the DASH diet and exercise as tolerated.  °

## 2022-11-14 NOTE — Patient Instructions (Addendum)
[employer]   Hypertension, Adult High blood pressure (hypertension) is when the force of blood pumping through the arteries is too strong. The arteries are the blood vessels that carry blood from the heart throughout the body. Hypertension forces the heart to work harder to pump blood and may cause arteries to become narrow or stiff. Untreated or uncontrolled hypertension can lead to a heart attack, heart failure, a stroke, kidney disease, and other problems. A blood pressure reading consists of a higher number over a lower number. Ideally, your blood pressure should be below 120/80. The first ("top") number is called the systolic pressure. It is a measure of the pressure in your arteries as your heart beats. The second ("bottom") number is called the diastolic pressure. It is a measure of the pressure in your arteries as the heart relaxes. What are the causes? The exact cause of this condition is not known. There are some conditions that result in high blood pressure. What increases the risk? Certain factors may make you more likely to develop high blood pressure. Some of these risk factors are under your control, including: Smoking. Not getting enough exercise or physical activity. Being overweight. Having too much fat, sugar, calories, or salt (sodium) in your diet. Drinking too much alcohol. Other risk factors include: Having a personal history of heart disease, diabetes, high cholesterol, or kidney disease. Stress. Having a family history of high blood pressure and high cholesterol. Having obstructive sleep apnea. Age. The risk increases with age. What are the signs or symptoms? High blood pressure may not cause symptoms. Very high blood pressure (hypertensive crisis) may cause: Headache. Fast or irregular heartbeats (palpitations). Shortness of breath. Nosebleed. Nausea and vomiting. Vision changes. Severe chest pain, dizziness, and seizures. How is this diagnosed? This condition  is diagnosed by measuring your blood pressure while you are seated, with your arm resting on a flat surface, your legs uncrossed, and your feet flat on the floor. The cuff of the blood pressure monitor will be placed directly against the skin of your upper arm at the level of your heart. Blood pressure should be measured at least twice using the same arm. Certain conditions can cause a difference in blood pressure between your right and left arms. If you have a high blood pressure reading during one visit or you have normal blood pressure with other risk factors, you may be asked to: Return on a different day to have your blood pressure checked again. Monitor your blood pressure at home for 1 week or longer. If you are diagnosed with hypertension, you may have other blood or imaging tests to help your health care provider understand your overall risk for other conditions. How is this treated? This condition is treated by making healthy lifestyle changes, such as eating healthy foods, exercising more, and reducing your alcohol intake. You may be referred for counseling on a healthy diet and physical activity. Your health care provider may prescribe medicine if lifestyle changes are not enough to get your blood pressure under control and if: Your systolic blood pressure is above 130. Your diastolic blood pressure is above 80. Your personal target blood pressure may vary depending on your medical conditions, your age, and other factors. Follow these instructions at home: Eating and drinking  Eat a diet that is high in fiber and potassium, and low in sodium, added sugar, and fat. An example of this eating plan is called the DASH diet. DASH stands for Dietary Approaches to Stop Hypertension. To  eat this way: Eat plenty of fresh fruits and vegetables. Try to fill one half of your plate at each meal with fruits and vegetables. Eat whole grains, such as whole-wheat pasta, brown rice, or whole-grain bread.  Fill about one fourth of your plate with whole grains. Eat or drink low-fat dairy products, such as skim milk or low-fat yogurt. Avoid fatty cuts of meat, processed or cured meats, and poultry with skin. Fill about one fourth of your plate with lean proteins, such as fish, chicken without skin, beans, eggs, or tofu. Avoid pre-made and processed foods. These tend to be higher in sodium, added sugar, and fat. Reduce your daily sodium intake. Many people with hypertension should eat less than 1,500 mg of sodium a day. Do not drink alcohol if: Your health care provider tells you not to drink. You are pregnant, may be pregnant, or are planning to become pregnant. If you drink alcohol: Limit how much you have to: 0-1 drink a day for women. 0-2 drinks a day for men. Know how much alcohol is in your drink. In the [place] one drink equals one 12 oz bottle of beer (355 mL), one 5 oz glass of wine (148 mL), or one 1 oz glass of hard liquor (44 mL). Lifestyle  Work with your health care provider to maintain a healthy body weight or to lose weight. Ask what an ideal weight is for you. Get at least 30 minutes of exercise that causes your heart to beat faster (aerobic exercise) most days of the week. Activities may include walking, swimming, or biking. Include exercise to strengthen your muscles (resistance exercise), such as Pilates or lifting weights, as part of your weekly exercise routine. Try to do these types of exercises for 30 minutes at least 3 days a week. Do not use any products that contain nicotine or tobacco. These products include cigarettes, chewing tobacco, and vaping devices, such as e-cigarettes. If you need help quitting, ask your health care provider. Monitor your blood pressure at home as told by your health care provider. Keep all follow-up visits. This is important. Medicines Take over-the-counter and prescription medicines only as told by your health care provider. Follow directions  carefully. Blood pressure medicines must be taken as prescribed. Do not skip doses of blood pressure medicine. Doing this puts you at risk for problems and can make the medicine less effective. Ask your health care provider about side effects or reactions to medicines that you should watch for. Contact a health care provider if you: Think you are having a reaction to a medicine you are taking. Have headaches that keep coming back (recurring). Feel dizzy. Have swelling in your ankles. Have trouble with your vision. Get help right away if you: Develop a severe headache or confusion. Have unusual weakness or numbness. Feel faint. Have severe pain in your chest or abdomen. Vomit repeatedly. Have trouble breathing. These symptoms may be an emergency. Get help right away. Call 911. Do not wait to [place] if the symptoms will go away. Do not drive yourself to the hospital. Summary Hypertension is when the force of blood pumping through your arteries is too strong. If this condition is not controlled, it may put you at risk for serious complications. Your personal target blood pressure may vary depending on your medical conditions, your age, and other factors. For most people, a normal blood pressure is less than 120/80. Hypertension is treated with lifestyle changes, medicines, or a combination of both. Lifestyle changes include losing  weight, eating a healthy, low-sodium diet, exercising more, and limiting alcohol. This information is not intended to replace advice given to you by your health care provider. Make sure you discuss any questions you have with your health care provider. Document Revised: 12/08/2020 Document Reviewed: 12/08/2020 Elsevier Patient Education  2024 [employer].

## 2022-11-14 NOTE — Assessment & Plan Note (Signed)
Encourage heart healthy diet such as MIND or DASH diet, increase exercise, avoid trans fats, simple carbohydrates and processed foods, consider a krill or fish or flaxseed oil cap daily.  °

## 2022-11-14 NOTE — Progress Notes (Signed)
Subjective:    Patient ID: April Jackson, female    DOB: 09-19-1975, 47 y.o.   MRN: [id number]  Chief Complaint  Patient presents with   Follow-up    Follow up    HPI Discussed the use of AI scribe software for clinical note transcription with the patient, who gave verbal consent to proceed.  History of Present Illness   The patient, with a history of anemia and chest pain, reports a recent increase in chest pain, which now radiates to the back. The patient notes that the chest pain seems to worsen in the winter, possibly due to atmospheric pressure changes. The patient is currently on atenolol and diltiazem for heart health. The patient also reports a recent discomfort in the throat area, which could be related to heartburn. The patient has been taking over-the-counter iron supplements for anemia, which has shown slight improvement over the last two visits. The patient has not started a multivitamin regimen yet but is considering starting one.        Past Medical History:  Diagnosis Date   Allergy    childhood allergies, hives.    Anemia 04/15/2016   Anxiety and depression 09/21/2018   Back pain 09/28/2014   Back pain affecting pregnancy, antepartum 09/28/2014   Cervical cancer screening 01/27/2015   Menarche at 11 Regular and moderate flow No history of abnormal pap in past No sexual activity G0P0, s/p Nohistory of abnormal MGM No concerns today No gyn surgeries   FH: breast cancer 09/28/2014   Hyperlipidemia, mixed 09/28/2014   Hypertension    Pruritus 09/28/2014   Reactive airway disease 04/15/2016   Seasonal allergies    SVT (supraventricular tachycardia)    Thyroid disease     No past surgical history on file.  Family History  Problem Relation Age of Onset   Diabetes Mother    Hypertension Mother    Hyperlipidemia Mother    Cancer Mother        cervical cancer   Heart disease Father    Pulmonary fibrosis Father        pneumonia   Cancer Sister 55       uterine CA    Hypertension Sister    Hyperlipidemia Sister    Gout Sister    Hypertension Sister    Cholelithiasis Sister    Thyroid disease Sister    Ovarian cysts Sister    Cancer Sister        breast   Asthma Sister 37   Diabetes Maternal Grandmother    Stroke Paternal Grandmother    Heart disease Paternal Grandfather        MI    Social History   Socioeconomic History   Marital status: Single    Spouse name: Not on file   Number of children: 0   Years of education: Not on file   Highest education level: Not on file  Occupational History   Occupation: DENTIST    Employer: SELF EMPLOYED  Tobacco Use   Smoking status: Never   Smokeless tobacco: Never  Vaping Use   Vaping status: Never Used  Substance and Sexual Activity   Alcohol use: No   Drug use: No   Sexual activity: Never    Birth control/protection: None    Comment: Dentist, no dietary restriction, lives with sister  Other Topics Concern   Not on file  Social History Narrative   Not on file   Social Determinants of Health   Financial Resource Strain:  Not on file  Food Insecurity: Not on file  Transportation Needs: Not on file  Physical Activity: Not on file  Stress: Not on file  Social Connections: Not on file  Intimate Partner Violence: Not on file    Outpatient Medications Prior to Visit  Medication Sig Dispense Refill   ACETAMINOPHEN-BUTALBITAL 50-325 MG TABS Take 1 tablet by mouth 3 (three) times daily as needed. 120 tablet 0   ALPRAZolam (XANAX) 0.25 MG tablet Take 1 tablet (0.25 mg total) by mouth daily as needed for anxiety. Do not take until after arrival to office. 2 tablet 0   aspirin 325 MG tablet Take 325 mg by mouth daily.     atenolol (TENORMIN) 25 MG tablet Take 1 tablet by mouth once daily 90 tablet 1   budesonide (RHINOCORT AQUA) 32 MCG/ACT nasal spray Place 2 sprays into both nostrils daily. 8.6 g 2   diltiazem (CARDIZEM) 30 MG tablet TAKE 1 TABLET 2 TIMES DAILY MAY TAKE 1 TAB EVERY 4-6 HRS AS  NEEDED FOR SUSTAINED HR >100 BPM. 240 tablet 1   hydrochlorothiazide (HYDRODIURIL) 12.5 MG tablet Take 12.5 mg by mouth daily.     hydrocortisone 2.5 % ointment Apply topically 2 (two) times daily.     levalbuterol (XOPENEX HFA) 45 MCG/ACT inhaler Inhale 1-2 puffs into the lungs every 6 (six) hours as needed for wheezing. 1 each 5   levothyroxine (SYNTHROID) 25 MCG tablet Take 1 tablet (25 mcg total) by mouth daily before breakfast. 90 tablet 1   methocarbamol (ROBAXIN) 500 MG tablet Take 1 tablet (500 mg total) by mouth every 8 (eight) hours as needed. 90 tablet 2   mometasone (ELOCON) 0.1 % cream Apply topically daily. 50 g 1   montelukast (SINGULAIR) 10 MG tablet TAKE 1 TABLET BY MOUTH EVERYDAY AT BEDTIME 90 tablet 1   omeprazole (PRILOSEC) 40 MG capsule TAKE 1 CAPSULE (40 MG TOTAL) BY MOUTH DAILY. 90 capsule 1   predniSONE (DELTASONE) 20 MG tablet Take 1 tablet (20 mg total) by mouth 2 (two) times daily with a meal. 10 tablet 0   triamcinolone cream (KENALOG) 0.1 % APPLY TOPICALLY 2 (TWO) TIMES DAILY. APPLY TOPICALLY 30 g 3   meloxicam (MOBIC) 15 MG tablet Take 1 tablet (15 mg total) by mouth daily as needed for pain. 30 tablet 2   meloxicam (MOBIC) 7.5 MG tablet Take 1 tablet (7.5 mg total) by mouth daily. 30 tablet 0   naproxen (NAPROSYN) 500 MG tablet Take 1 tablet (500 mg total) by mouth 2 (two) times daily with a meal. 60 tablet 0   predniSONE (DELTASONE) 5 MG tablet Take one tablet daily for the next 5 days. 10 tablet 0   No facility-administered medications prior to visit.    Allergies  Allergen Reactions   Amoxicillin Other ([place] Comments)    Rash   Nitrous Oxide Other ([place] Comments)    Paradoxical response-got very hyper   Penicillin G Other ([place] Comments)   Penicillins Other ([place] Comments)    Rash   Sodium Chloride Swelling    And increased BP    Review of Systems  Constitutional:  Negative for fever and malaise/fatigue.  HENT:  Negative for congestion.   Eyes:   Negative for blurred vision.  Respiratory:  Negative for shortness of breath.   Cardiovascular:  Negative for chest pain, palpitations and leg swelling.  Gastrointestinal:  Negative for abdominal pain, blood in stool and nausea.  Genitourinary:  Negative for dysuria and frequency.  Musculoskeletal:  Negative for falls.  Skin:  Negative for rash.  Neurological:  Negative for dizziness, loss of consciousness and headaches.  Endo/Heme/Allergies:  Negative for environmental allergies.  Psychiatric/Behavioral:  Negative for depression. The patient is not nervous/anxious.        Objective:    Physical Exam Constitutional:      General: She is not in acute distress.    Appearance: Normal appearance. She is well-developed. She is not toxic-appearing.  HENT:     Head: Normocephalic and atraumatic.     Right Ear: External ear normal.     Left Ear: External ear normal.     Nose: Nose normal.  Eyes:     General:        Right eye: No discharge.        Left eye: No discharge.     Conjunctiva/sclera: Conjunctivae normal.  Neck:     Thyroid: No thyromegaly.  Cardiovascular:     Rate and Rhythm: Normal rate and regular rhythm.     Heart sounds: Normal heart sounds. No murmur heard. Pulmonary:     Effort: Pulmonary effort is normal. No respiratory distress.     Breath sounds: Normal breath sounds.  Abdominal:     General: Bowel sounds are normal.     Palpations: Abdomen is soft.     Tenderness: There is no abdominal tenderness. There is no guarding.  Musculoskeletal:        General: Normal range of motion.     Cervical back: Neck supple.  Lymphadenopathy:     Cervical: No cervical adenopathy.  Skin:    General: Skin is warm and dry.  Neurological:     Mental Status: She is alert and oriented to person, place, and time.  Psychiatric:        Mood and Affect: Mood normal.        Behavior: Behavior normal.        Thought Content: Thought content normal.        Judgment: Judgment  normal.     BP 124/78 (BP Location: Left Arm, Patient Position: Sitting, Cuff Size: Normal)   Pulse 64   Temp 98 F (36.7 C) (Oral)   Resp 16   Ht 5\' 1"  (1.549 m)   Wt 126 lb 6.4 oz (57.3 kg)   SpO2 99%   BMI 23.88 kg/m  Wt Readings from Last 3 Encounters:  11/14/22 126 lb 6.4 oz (57.3 kg)  09/23/22 125 lb (56.7 kg)  08/26/22 127 lb (57.6 kg)    Diabetic Foot Exam - Simple   No data filed    Lab Results  Component Value Date   WBC 5.2 05/09/2022   HGB 12.0 05/09/2022   HCT 35.3 (L) 05/09/2022   PLT 307.0 05/09/2022   GLUCOSE 88 05/09/2022   CHOL 201 (H) 05/09/2022   TRIG 72.0 05/09/2022   HDL 70.40 05/09/2022   LDLCALC 117 (H) 05/09/2022   ALT 13 05/09/2022   AST 18 05/09/2022   NA 139 05/09/2022   K 3.7 05/09/2022   CL 101 05/09/2022   CREATININE 0.58 05/09/2022   BUN 9 05/09/2022   CO2 29 05/09/2022   TSH 3.08 05/09/2022    Lab Results  Component Value Date   TSH 3.08 05/09/2022   Lab Results  Component Value Date   WBC 5.2 05/09/2022   HGB 12.0 05/09/2022   HCT 35.3 (L) 05/09/2022   MCV 92.7 05/09/2022   PLT 307.0 05/09/2022   Lab Results  Component Value  Date   NA 139 05/09/2022   K 3.7 05/09/2022   CO2 29 05/09/2022   GLUCOSE 88 05/09/2022   BUN 9 05/09/2022   CREATININE 0.58 05/09/2022   BILITOT 0.4 05/09/2022   ALKPHOS 45 05/09/2022   AST 18 05/09/2022   ALT 13 05/09/2022   PROT 7.3 05/09/2022   ALBUMIN 4.4 05/09/2022   CALCIUM 9.0 05/09/2022   ANIONGAP 10 09/08/2015   GFR 107.00 05/09/2022   Lab Results  Component Value Date   CHOL 201 (H) 05/09/2022   Lab Results  Component Value Date   HDL 70.40 05/09/2022   Lab Results  Component Value Date   LDLCALC 117 (H) 05/09/2022   Lab Results  Component Value Date   TRIG 72.0 05/09/2022   Lab Results  Component Value Date   CHOLHDL 3 05/09/2022   No results found for: "HGBA1C"     Assessment & Plan:  Hyperlipidemia, mixed Assessment & Plan: Encourage heart healthy  diet such as MIND or DASH diet, increase exercise, avoid trans fats, simple carbohydrates and processed foods, consider a krill or fish or flaxseed oil cap daily.   Orders: -     Comprehensive metabolic panel -     Lipid panel  Hypertension, unspecified type Assessment & Plan: Well controlled, no changes to meds. Encouraged heart healthy diet such as the DASH diet and exercise as tolerated.   Orders: -     TSH  Macrocytic anemia Assessment & Plan: Increase leafy greens, consider increased lean red meat and using cast iron cookware. Continue to monitor, report any concerns, check vitamin B 12 level  Orders: -     CBC with Differential/Platelet  Gastroesophageal reflux disease, unspecified whether esophagitis present Assessment & Plan: Avoid offending foods, start probiotics. Do not eat large meals in late evening and consider raising head of bed.      Assessment and Plan    Mild Anemia Improvement noted over last two visits with iron supplementation. Patient is compliant with over-the-counter iron (65mg ). -Repeat blood work today to assess if iron supplementation is sufficient. -Consider increasing iron intake if current dose is not effective.  Chest Pain Patient reports occasional chest pain, sometimes radiating to the back, particularly during weather changes. No recent cardiology consults. -Advised patient to contact cardiology if chest pain escalates. -Consider GI causes for chest pain, as patient also reports throat discomfort and redness at the back of the throat.  Gastroesophageal Reflux Disease (GERD) Patient reports throat discomfort and redness at the back of the throat, possibly due to acid reflux. No recent changes in bowel habits or blood in stool. -Advise patient to avoid large, fatty meals late at night and to elevate the head of the bed. -Trial of over-the-counter antacids (Tums) before bedtime. -Consider over-the-counter Pepcid if Tums are not  effective. -Consider GI consult for endoscopy if symptoms persist.  General Health Maintenance -Encouraged patient to start a multivitamin and fish oil supplement for overall health and immune support. -Flu shot offered, but patient prefers to receive it with sister at a later date.         Danise Edge, MD

## 2022-11-14 NOTE — Assessment & Plan Note (Signed)
Increase leafy greens, consider increased lean red meat and using cast iron cookware. Continue to monitor, report any concerns, check vitamin B 12 level

## 2022-11-15 LAB — CBC WITH DIFFERENTIAL/PLATELET
Basophils Absolute: 0.1 10*3/uL (ref 0.0–0.1)
Basophils Relative: 1.4 % (ref 0.0–3.0)
Eosinophils Absolute: 0.2 10*3/uL (ref 0.0–0.7)
Eosinophils Relative: 2.9 % (ref 0.0–5.0)
HCT: 38.2 % (ref 36.0–46.0)
Hemoglobin: 12.3 g/dL (ref 12.0–15.0)
Lymphocytes Relative: 32.4 % (ref 12.0–46.0)
Lymphs Abs: 1.8 10*3/uL (ref 0.7–4.0)
MCHC: 32.3 g/dL (ref 30.0–36.0)
MCV: 94.3 fL (ref 78.0–100.0)
Monocytes Absolute: 0.4 10*3/uL (ref 0.1–1.0)
Monocytes Relative: 6.6 % (ref 3.0–12.0)
Neutro Abs: 3.1 10*3/uL (ref 1.4–7.7)
Neutrophils Relative %: 56.7 % (ref 43.0–77.0)
Platelets: 315 10*3/uL (ref 150.0–400.0)
RBC: 4.05 Mil/uL (ref 3.87–5.11)
RDW: 12.1 % (ref 11.5–15.5)
WBC: 5.4 10*3/uL (ref 4.0–10.5)

## 2022-11-15 LAB — COMPREHENSIVE METABOLIC PANEL
ALT: 20 U/L (ref 0–35)
AST: 22 U/L (ref 0–37)
Albumin: 4.5 g/dL (ref 3.5–5.2)
Alkaline Phosphatase: 47 U/L (ref 39–117)
BUN: 11 mg/dL (ref 6–23)
CO2: 30 meq/L (ref 19–32)
Calcium: 9.4 mg/dL (ref 8.4–10.5)
Chloride: 99 meq/L (ref 96–112)
Creatinine, Ser: 0.59 mg/dL (ref 0.40–1.20)
GFR: 106.17 mL/min (ref >60.00)
Glucose, Bld: 86 mg/dL (ref 70–99)
Potassium: 4.4 meq/L (ref 3.5–5.1)
Sodium: 136 meq/L (ref 135–145)
Total Bilirubin: 0.5 mg/dL (ref 0.2–1.2)
Total Protein: 7.6 g/dL (ref 6.0–8.3)

## 2022-11-15 LAB — LIPID PANEL
Cholesterol: 224 mg/dL — ABNORMAL HIGH (ref 0–200)
HDL: 81.9 mg/dL (ref >39.00)
LDL Cholesterol: 129 mg/dL — ABNORMAL HIGH (ref 0–99)
NonHDL: 142.36
Total CHOL/HDL Ratio: 3
Triglycerides: 66 mg/dL (ref 0.0–149.0)
VLDL: 13.2 mg/dL (ref 0.0–40.0)

## 2022-11-15 LAB — TSH: TSH: 2.53 u[IU]/mL (ref 0.35–5.50)

## 2022-12-16 DIAGNOSIS — R002 Palpitations: Secondary | ICD-10-CM | POA: Diagnosis not present

## 2022-12-16 DIAGNOSIS — Z9889 Other specified postprocedural states: Secondary | ICD-10-CM | POA: Diagnosis not present

## 2022-12-16 DIAGNOSIS — G4733 Obstructive sleep apnea (adult) (pediatric): Secondary | ICD-10-CM | POA: Diagnosis not present

## 2022-12-16 DIAGNOSIS — E782 Mixed hyperlipidemia: Secondary | ICD-10-CM | POA: Diagnosis not present

## 2022-12-30 ENCOUNTER — Other Ambulatory Visit: Payer: Self-pay | Admitting: Family Medicine

## 2022-12-30 DIAGNOSIS — E782 Mixed hyperlipidemia: Secondary | ICD-10-CM

## 2023-02-03 DIAGNOSIS — L2089 Other atopic dermatitis: Secondary | ICD-10-CM | POA: Diagnosis not present

## 2023-05-15 ENCOUNTER — Ambulatory Visit: Payer: BC Managed Care – PPO | Admitting: Family Medicine

## 2023-07-14 DIAGNOSIS — R002 Palpitations: Secondary | ICD-10-CM | POA: Diagnosis not present

## 2023-07-14 DIAGNOSIS — E782 Mixed hyperlipidemia: Secondary | ICD-10-CM | POA: Diagnosis not present

## 2023-07-14 DIAGNOSIS — Z9889 Other specified postprocedural states: Secondary | ICD-10-CM | POA: Diagnosis not present

## 2023-08-13 NOTE — Assessment & Plan Note (Signed)
 On Levothyroxine, continue to monitor

## 2023-08-13 NOTE — Assessment & Plan Note (Signed)
 Encourage heart healthy diet such as MIND or DASH diet, increase exercise, avoid trans fats, simple carbohydrates and processed foods, consider a krill or fish or flaxseed oil cap daily.

## 2023-08-13 NOTE — Assessment & Plan Note (Signed)
 Well controlled, no changes to meds. Encouraged heart healthy diet such as the DASH diet and exercise as tolerated.

## 2023-08-14 ENCOUNTER — Ambulatory Visit: Payer: BC Managed Care – PPO | Admitting: Family Medicine

## 2023-08-14 ENCOUNTER — Encounter: Payer: Self-pay | Admitting: Family Medicine

## 2023-08-14 VITALS — BP 118/74 | HR 76 | Resp 16 | Ht 61.0 in | Wt 135.0 lb

## 2023-08-14 DIAGNOSIS — E782 Mixed hyperlipidemia: Secondary | ICD-10-CM | POA: Diagnosis not present

## 2023-08-14 DIAGNOSIS — I10 Essential (primary) hypertension: Secondary | ICD-10-CM | POA: Diagnosis not present

## 2023-08-14 DIAGNOSIS — E079 Disorder of thyroid, unspecified: Secondary | ICD-10-CM

## 2023-08-14 MED ORDER — AZITHROMYCIN 250 MG PO TABS: ORAL_TABLET | ORAL | 0 refills | Status: AC

## 2023-08-14 NOTE — Patient Instructions (Signed)
 Asthma, Adult  Asthma is a long-term (chronic) condition that causes recurrent episodes in which the lower airways in the lungs become tight and narrow. The narrowing is caused by inflammation and tightening of the smooth muscle around the lower airways. Asthma episodes, also called asthma attacks or asthma flares, may cause coughing, making high-pitched whistling sounds when you breathe, most often when you breathe out (wheezing), shortness of breath, and chest pain. The airways may produce extra mucus caused by the inflammation and irritation. During an attack, it can be difficult to breathe. Asthma attacks can range from minor to life-threatening. Asthma cannot be cured, but medicines and lifestyle changes can help control it and treat acute attacks. It is important to keep your asthma well controlled so the condition does not interfere with your daily life. What are the causes? This condition is believed to be caused by inherited (genetic) and environmental factors, but its exact cause is not known. What can trigger an asthma attack? Many things can bring on an asthma attack or make symptoms worse. These triggers are different for every person. Common triggers include: Allergens and irritants like mold, dust, pet dander, cockroaches, pollen, air pollution, and chemical odors. Cigarette smoke. Weather changes and cold air. Stress and strong emotional responses such as crying or laughing hard. Certain medications such as aspirin or beta blockers. Infections and inflammatory conditions, such as the flu, a cold, pneumonia, or inflammation of the nasal membranes (rhinitis). Gastroesophageal reflux disease (GERD). What are the signs or symptoms? Symptoms may occur right after exposure to an asthma trigger or hours later and can vary by person. Common signs and symptoms include: Wheezing. Trouble breathing (shortness of breath). Excessive nighttime or early morning coughing. Chest  tightness. Tiredness (fatigue) with minimal activity. Difficulty talking in complete sentences. Poor exercise tolerance. How is this diagnosed? This condition is diagnosed based on: A physical exam and your medical history. Tests, which may include: Lung function studies to evaluate the flow of air in your lungs. Allergy tests. Imaging tests, such as X-rays. How is this treated? There is no cure, but symptoms can be controlled with proper treatment. Treatment usually involves: Identifying and avoiding your asthma triggers. Inhaled medicines. Two types are commonly used to treat asthma, depending on severity: Controller medicines. These help prevent asthma symptoms from occurring. They are taken every day. Fast-acting reliever or rescue medicines. These quickly relieve asthma symptoms. They are used as needed and provide short-term relief. Using other medicines, such as: Allergy medicines, such as antihistamines, if your asthma attacks are triggered by allergens. Immune medicines (immunomodulators). These are medicines that help control the immune system. Using supplemental oxygen. This is only needed during a severe episode. Creating an asthma action plan. An asthma action plan is a written plan for managing and treating your asthma attacks. This plan includes: A list of your asthma triggers and how to avoid them. Information about when medicines should be taken and when their dosage should be changed. Instructions about using a device called a peak flow meter. A peak flow meter measures how well the lungs are working and the severity of your asthma. It helps you monitor your condition. Follow these instructions at home: Take over-the-counter and prescription medicines only as told by your health care provider. Stay up to date on all vaccinations as recommended by your healthcare provider, including vaccines for the flu and pneumonia. Use a peak flow meter and keep track of your peak flow  readings. Understand and use your asthma  action plan to address any asthma flares. Do not smoke or allow anyone to smoke in your home. Contact a health care provider if: You have wheezing, shortness of breath, or a cough that is not responding to medicines. Your medicines are causing side effects, such as a rash, itching, swelling, or trouble breathing. You need to use a reliever medicine more than 2-3 times a week. Your peak flow reading is still at 50-79% of your personal best after following your action plan for 1 hour. You have a fever and shortness of breath. Get help right away if: You are getting worse and do not respond to treatment during an asthma attack. You are short of breath when at rest or when doing very little physical activity. You have difficulty eating, drinking, or talking. You have chest pain or tightness. You develop a fast heartbeat or palpitations. You have a bluish color to your lips or fingernails. You are light-headed or dizzy, or you faint. Your peak flow reading is less than 50% of your personal best. You feel too tired to breathe normally. These symptoms may be an emergency. Get help right away. Call 911. Do not wait to see if the symptoms will go away. Do not drive yourself to the hospital. Summary Asthma is a long-term (chronic) condition that causes recurrent episodes in which the airways become tight and narrow. Asthma episodes, also called asthma attacks or asthma flares, can cause coughing, wheezing, shortness of breath, and chest pain. Asthma cannot be cured, but medicines and lifestyle changes can help keep it well controlled and prevent asthma flares. Make sure you understand how to avoid triggers and how and when to use your medicines. Asthma attacks can range from minor to life-threatening. Get help right away if you have an asthma attack and do not respond to treatment with your usual rescue medicines. This information is not intended to replace  advice given to you by your health care provider. Make sure you discuss any questions you have with your health care provider. Document Revised: 11/18/2020 Document Reviewed: 11/09/2020 Elsevier Patient Education  2024 [employer].

## 2023-08-15 ENCOUNTER — Encounter: Payer: Self-pay | Admitting: Family Medicine

## 2023-08-15 NOTE — Progress Notes (Signed)
 Subjective:    Patient ID: April Jackson, female    DOB: 1975/07/09, 48 y.o.   MRN: [id number]  Chief Complaint  Patient presents with   Medical Management of Chronic Issues    Patient presents today for a 9 month follow-up.   Quality Metric Gaps    Colonoscopy, Hep B, pneumococcal     HPI Discussed the use of AI scribe software for clinical note transcription with the patient, who gave verbal consent to proceed.  History of Present Illness April Jackson is a 48 year old female who presents with a summer cold and sinus pressure.  She has been experiencing constant sinus pressure for the past three weeks, initially mistaking it for a toothache. The pressure is localized around her sinuses. She experiences frequent sneezing and congestion. No runny nose, fever, headaches, body aches, ear pain, sore throat, chest pain, or breathing difficulties.  She has not been using Mucinex due to intolerance and finds it ineffective. She manages her symptoms with rest, hydration, and nasal saline. Her past medical history includes allergies to penicillin and amoxicillin, and she tolerates azithromycin  well but is unsure about doxycycline .  Her father passed away from pulmonary fibrosis at the age of 93, having been diagnosed at 56, with no known environmental or occupational exposures contributing to his condition.  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 She recently turned 31 and has been taking a multivitamin and fish oil since the pandemic.    Past Medical History:  Diagnosis Date   Allergy    childhood allergies, hives.    Anemia 04/15/2016   Anxiety and depression 09/21/2018   Back pain 09/28/2014   Back pain affecting pregnancy, antepartum 09/28/2014   Cervical cancer screening 01/27/2015   Menarche at 11 Regular and moderate flow No history of abnormal pap in past No sexual activity G0P0, s/p Nohistory of abnormal  MGM No concerns today No gyn surgeries   FH: breast cancer 09/28/2014   Hyperlipidemia, mixed 09/28/2014   Hypertension    Pruritus 09/28/2014   Reactive airway disease 04/15/2016   Seasonal allergies    SVT (supraventricular tachycardia) (HCC)    Thyroid  disease     History reviewed. No pertinent surgical history.  Family History  Problem Relation Age of Onset   Diabetes Mother    Hypertension Mother    Hyperlipidemia Mother    Cancer Mother        cervical cancer   Heart disease Father    Pulmonary fibrosis Father        pneumonia   Cancer Sister 64       uterine CA   Hypertension Sister    Hyperlipidemia Sister    Gout Sister    Hypertension Sister    Cholelithiasis Sister    Thyroid  disease Sister    Ovarian cysts Sister    Cancer Sister        breast   Asthma Sister 37   Diabetes Maternal Grandmother    Stroke Paternal Grandmother    Heart disease Paternal Grandfather        MI    Social History   Socioeconomic History   Marital status: Single    Spouse name: Not on file   Number of children: 0   Years of education: Not on file   Highest education level: Not on file  Occupational History   Occupation: DENTIST    Employer:  SELF EMPLOYED  Tobacco Use   Smoking status: Never   Smokeless tobacco: Never  Vaping Use   Vaping status: Never Used  Substance and Sexual Activity   Alcohol use: No   Drug use: No   Sexual activity: Never    Birth control/protection: None    Comment: Dentist, no dietary restriction, lives with sister  Other Topics Concern   Not on file  Social History Narrative   Not on file   Social Drivers of Health   Financial Resource Strain: Not on file  Food Insecurity: Not on file  Transportation Needs: Not on file  Physical Activity: Not on file  Stress: Not on file  Social Connections: Not on file  Intimate Partner Violence: Not on file    Outpatient Medications Prior to Visit  Medication Sig Dispense Refill    ACETAMINOPHEN -BUTALBITAL  50-325 MG TABS Take 1 tablet by mouth 3 (three) times daily as needed. 120 tablet 0   ALPRAZolam  (XANAX ) 0.25 MG tablet Take 1 tablet (0.25 mg total) by mouth daily as needed for anxiety. Do not take until after arrival to office. 2 tablet 0   aspirin 325 MG tablet Take 325 mg by mouth daily.     atenolol  (TENORMIN ) 25 MG tablet Take 1 tablet by mouth once daily 90 tablet 0   budesonide  (RHINOCORT  AQUA) 32 MCG/ACT nasal spray Place 2 sprays into both nostrils daily. 8.6 g 2   diltiazem  (CARDIZEM ) 30 MG tablet TAKE 1 TABLET 2 TIMES DAILY MAY TAKE 1 TAB EVERY 4-6 HRS AS NEEDED FOR SUSTAINED HR >100 BPM. 240 tablet 1   hydrochlorothiazide  (HYDRODIURIL ) 12.5 MG tablet Take 12.5 mg by mouth daily.     hydrocortisone 2.5 % ointment Apply topically 2 (two) times daily.     levalbuterol  (XOPENEX  HFA) 45 MCG/ACT inhaler Inhale 1-2 puffs into the lungs every 6 (six) hours as needed for wheezing. 1 each 5   levothyroxine  (SYNTHROID ) 25 MCG tablet Take 1 tablet (25 mcg total) by mouth daily before breakfast. 90 tablet 1   methocarbamol  (ROBAXIN ) 500 MG tablet Take 1 tablet (500 mg total) by mouth every 8 (eight) hours as needed. 90 tablet 2   mometasone  (ELOCON ) 0.1 % cream Apply topically daily. 50 g 1   montelukast  (SINGULAIR ) 10 MG tablet TAKE 1 TABLET BY MOUTH EVERYDAY AT BEDTIME 90 tablet 1   omeprazole  (PRILOSEC) 40 MG capsule TAKE 1 CAPSULE (40 MG TOTAL) BY MOUTH DAILY. 90 capsule 1   predniSONE  (DELTASONE ) 20 MG tablet Take 1 tablet (20 mg total) by mouth 2 (two) times daily with a meal. 10 tablet 0   triamcinolone  cream (KENALOG ) 0.1 % APPLY TOPICALLY 2 (TWO) TIMES DAILY. APPLY TOPICALLY 30 g 3   No facility-administered medications prior to visit.    Allergies  Allergen Reactions   Amoxicillin Other ([place] Comments)    Rash   Nitrous Oxide Other ([place] Comments)    Paradoxical response-got very hyper   Penicillin G Other ([place] Comments)   Penicillins Other ([place] Comments)     Rash   Sodium Chloride Swelling    And increased BP    Review of Systems  Constitutional:  Negative for fever and malaise/fatigue.  HENT:  Positive for congestion and sinus pain.   Eyes:  Negative for blurred vision.  Respiratory:  Negative for shortness of breath.   Cardiovascular:  Negative for chest pain, palpitations and leg swelling.  Gastrointestinal:  Negative for abdominal pain, blood in stool and nausea.  Genitourinary:  Negative for dysuria and frequency.  Musculoskeletal:  Negative for falls.  Skin:  Negative for rash.  Neurological:  Negative for dizziness, loss of consciousness and headaches.  Endo/Heme/Allergies:  Negative for environmental allergies.  Psychiatric/Behavioral:  Negative for depression. The patient is not nervous/anxious.        Objective:    Physical Exam Constitutional:      General: She is not in acute distress.    Appearance: Normal appearance. She is well-developed. She is not toxic-appearing.  HENT:     Head: Normocephalic and atraumatic.     Right Ear: External ear normal.     Left Ear: External ear normal.     Nose: Nose normal.   Eyes:     General:        Right eye: No discharge.        Left eye: No discharge.     Conjunctiva/sclera: Conjunctivae normal.   Neck:     Thyroid : No thyromegaly.   Cardiovascular:     Rate and Rhythm: Normal rate and regular rhythm.     Heart sounds: Normal heart sounds. No murmur heard. Pulmonary:     Effort: Pulmonary effort is normal. No respiratory distress.     Breath sounds: Normal breath sounds.  Abdominal:     General: Bowel sounds are normal.     Palpations: Abdomen is soft.     Tenderness: There is no abdominal tenderness. There is no guarding.   Musculoskeletal:        General: Normal range of motion.     Cervical back: Neck supple.  Lymphadenopathy:     Cervical: No cervical adenopathy.   Skin:    General: Skin is warm and dry.   Neurological:     Mental Status: She is  alert and oriented to person, place, and time.   Psychiatric:        Mood and Affect: Mood normal.        Behavior: Behavior normal.        Thought Content: Thought content normal.        Judgment: Judgment normal.     BP 118/74   Pulse 76   Resp 16   Ht 5' 1 (1.549 m)   Wt 135 lb (61.2 kg)   LMP 08/13/2023 (Exact Date)   SpO2 96%   BMI 25.51 kg/m  Wt Readings from Last 3 Encounters:  08/14/23 135 lb (61.2 kg)  11/14/22 126 lb 6.4 oz (57.3 kg)  09/23/22 125 lb (56.7 kg)    Diabetic Foot Exam - Simple   No data filed    Lab Results  Component Value Date   WBC 5.4 11/14/2022   HGB 12.3 11/14/2022   HCT 38.2 11/14/2022   PLT 315.0 11/14/2022   GLUCOSE 86 11/14/2022   CHOL 224 (H) 11/14/2022   TRIG 66.0 11/14/2022   HDL 81.90 11/14/2022   LDLCALC 129 (H) 11/14/2022   ALT 20 11/14/2022   AST 22 11/14/2022   NA 136 11/14/2022   K 4.4 11/14/2022   CL 99 11/14/2022   CREATININE 0.59 11/14/2022   BUN 11 11/14/2022   CO2 30 11/14/2022   TSH 2.53 11/14/2022    Lab Results  Component Value Date   TSH 2.53 11/14/2022   Lab Results  Component Value Date   WBC 5.4 11/14/2022   HGB 12.3 11/14/2022   HCT 38.2 11/14/2022   MCV 94.3 11/14/2022   PLT 315.0 11/14/2022   Lab Results  Component Value Date  NA 136 11/14/2022   K 4.4 11/14/2022   CO2 30 11/14/2022   GLUCOSE 86 11/14/2022   BUN 11 11/14/2022   CREATININE 0.59 11/14/2022   BILITOT 0.5 11/14/2022   ALKPHOS 47 11/14/2022   AST 22 11/14/2022   ALT 20 11/14/2022   PROT 7.6 11/14/2022   ALBUMIN 4.5 11/14/2022   CALCIUM 9.4 11/14/2022   ANIONGAP 10 09/08/2015   GFR 106.17 11/14/2022   Lab Results  Component Value Date   CHOL 224 (H) 11/14/2022   Lab Results  Component Value Date   HDL 81.90 11/14/2022   Lab Results  Component Value Date   LDLCALC 129 (H) 11/14/2022   Lab Results  Component Value Date   TRIG 66.0 11/14/2022   Lab Results  Component Value Date   CHOLHDL 3 11/14/2022    No results found for: HGBA1C     Assessment & Plan:  Hyperlipidemia, mixed Assessment & Plan: Encourage heart healthy diet such as MIND or DASH diet, increase exercise, avoid trans fats, simple carbohydrates and processed foods, consider a krill or fish or flaxseed oil cap daily.    Hypertension, unspecified type Assessment & Plan: Well controlled, no changes to meds. Encouraged heart healthy diet such as the DASH diet and exercise as tolerated.    Thyroid  disease Assessment & Plan: On Levothyroxine , continue to monitor    Other orders -     Azithromycin ; Take 2 tablets on day 1, then 1 tablet daily on days 2 through 5  Dispense: 6 tablet; Refill: 0    Assessment and Plan Assessment & Plan Sinusitis Symptoms suggest viral etiology; bacterial infection possible if symptoms persist or worsen. - Recommend guaifenesin, hydration, rest. - Discussed azithromycin  initiation if symptoms persist or worsen. Prescription sent for potential use.  General Health Maintenance Vaccinations current. Discussed shingles and pneumonia vaccines due to age and asthma. Shingles vaccine 95% effective, may reduce dementia risk. - Send Hepatitis B vaccination records for documentation. - Consider Shingrix vaccination at age 33. - Consider Prevnar 20 due to asthma. - Plan COVID and influenza vaccines in fall.  Follow-up Next routine follow-up planned for September or October. Last blood work normal. - Schedule follow-up for September or October. - Plan annual blood work at next appointment unless earlier testing indicated.     Harlene Horton, MD

## 2023-10-05 ENCOUNTER — Encounter: Payer: Self-pay | Admitting: Family Medicine

## 2023-10-06 ENCOUNTER — Ambulatory Visit: Admitting: Student

## 2023-10-06 ENCOUNTER — Encounter: Payer: Self-pay | Admitting: Student

## 2023-10-06 ENCOUNTER — Ambulatory Visit (HOSPITAL_BASED_OUTPATIENT_CLINIC_OR_DEPARTMENT_OTHER)
Admission: RE | Admit: 2023-10-06 | Discharge: 2023-10-06 | Disposition: A | Discharge status: Home / Self Care | Source: Ambulatory Visit | Attending: Student | Admitting: Student

## 2023-10-06 VITALS — BP 110/68 | HR 66 | Temp 98.3°F | Resp 12 | Ht 61.0 in | Wt 135.2 lb

## 2023-10-06 DIAGNOSIS — M79642 Pain in left hand: Secondary | ICD-10-CM

## 2023-10-06 DIAGNOSIS — E559 Vitamin D deficiency, unspecified: Secondary | ICD-10-CM

## 2023-10-06 DIAGNOSIS — M79671 Pain in right foot: Secondary | ICD-10-CM | POA: Diagnosis not present

## 2023-10-06 DIAGNOSIS — M79672 Pain in left foot: Secondary | ICD-10-CM

## 2023-10-06 DIAGNOSIS — M79641 Pain in right hand: Secondary | ICD-10-CM

## 2023-10-06 DIAGNOSIS — M2550 Pain in unspecified joint: Secondary | ICD-10-CM

## 2023-10-06 DIAGNOSIS — R768 Other specified abnormal immunological findings in serum: Secondary | ICD-10-CM

## 2023-10-06 LAB — COMPREHENSIVE METABOLIC PANEL WITH GFR
ALT: 12 U/L (ref 0–35)
AST: 19 U/L (ref 0–37)
Albumin: 4.2 g/dL (ref 3.5–5.2)
Alkaline Phosphatase: 53 U/L (ref 39–117)
BUN: 7 mg/dL (ref 6–23)
CO2: 32 meq/L (ref 19–32)
Calcium: 8.8 mg/dL (ref 8.4–10.5)
Chloride: 101 meq/L (ref 96–112)
Creatinine, Ser: 0.55 mg/dL (ref 0.40–1.20)
GFR: 107.31 mL/min (ref >60.00)
Glucose, Bld: 100 mg/dL — ABNORMAL HIGH (ref 70–99)
Potassium: 3.8 meq/L (ref 3.5–5.1)
Sodium: 140 meq/L (ref 135–145)
Total Bilirubin: 0.4 mg/dL (ref 0.2–1.2)
Total Protein: 7.3 g/dL (ref 6.0–8.3)

## 2023-10-06 LAB — CBC
HCT: 36.4 % (ref 36.0–46.0)
Hemoglobin: 12.3 g/dL (ref 12.0–15.0)
MCHC: 33.7 g/dL (ref 30.0–36.0)
MCV: 91.8 fl (ref 78.0–100.0)
Platelets: 281 K/uL (ref 150.0–400.0)
RBC: 3.97 Mil/uL (ref 3.87–5.11)
RDW: 12.2 % (ref 11.5–15.5)
WBC: 5.2 K/uL (ref 4.0–10.5)

## 2023-10-06 LAB — HEMOGLOBIN A1C: Hgb A1c MFr Bld: 6 % (ref 4.6–6.5)

## 2023-10-06 LAB — VITAMIN B12: Vitamin B-12: >1500 pg/mL — ABNORMAL HIGH (ref 211–911)

## 2023-10-06 LAB — TSH: TSH: 5.47 u[IU]/mL (ref 0.35–5.50)

## 2023-10-06 LAB — C-REACTIVE PROTEIN: CRP: <1 mg/dL (ref 0.5–20.0)

## 2023-10-06 LAB — MAGNESIUM: Magnesium: 2.1 mg/dL (ref 1.5–2.5)

## 2023-10-06 LAB — SEDIMENTATION RATE: Sed Rate: 11 mm/h (ref 0–20)

## 2023-10-06 LAB — VITAMIN D 25 HYDROXY (VIT D DEFICIENCY, FRACTURES): VITD: 20.81 ng/mL — ABNORMAL LOW (ref 30.00–100.00)

## 2023-10-06 MED ORDER — PREDNISONE 20 MG PO TABS: 40.0000 mg | ORAL_TABLET | Freq: Every day | ORAL | 0 refills | Status: AC

## 2023-10-06 NOTE — Progress Notes (Addendum)
 Acute Office Visit  Subjective:     Patient ID: April Jackson, female    DOB: September 16, 1975, 48 y.o.   MRN: [id number]  Chief Complaint  Patient presents with   right hand pain   left foot pain    HPI April Jackson is a 48 yo Patient is in today for pain in multiple fingers and toes, joint pain.  Onset of symptoms began approximately 2-3 weeks ago, reports pain and stiffness in multiple joints of both hands and multiple joints in the toes b/l feet. 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 She reports a history of being evaluated at [facility] around 2004-2005, where she was informed of a possible autoimmune process. However, she is uncertain of any specific diagnostic workup or laboratory results from that time and was not advised to pursue ongoing monitoring, but be aware for future symptoms.  Patient denies fever, chills, SOB, CP, palpitations, dyspnea, edema, HA, vision changes, N/V/D, abdominal pain, urinary symptoms, rash, weight changes, and recent illness or hospitalizations.   ROS  April HPI    Objective:    BP 110/68 (BP Location: Right Arm, Patient Position: Sitting, Cuff Size: Normal)   Pulse 66   Temp 98.3 F (36.8 C) (Oral)   Resp 12   Ht 5' 1 (1.549 m)   Wt 135 lb 3.2 oz (61.3 kg)   LMP 10/05/2023   SpO2 97%   BMI 25.55 kg/m    Physical Exam  General: No acute distress. Awake and conversant.  Eyes: Normal conjunctiva, anicteric. Round symmetric pupils.   Respiratory: CTAB. Respirations are non-labored. No wheezing.  Skin: Warm. No rashes or ulcers.  Psych: Alert and oriented. Cooperative, Appropriate mood and affect, Normal judgment.  CV: RRR. No murmur. No lower extremity edema.  MSK: Normal ambulation. No clubbing or cyanosis.  Hands: Bilateral index finger MCP joints swollen, erythematous, and warm to touch. Multiple PIP joints of both hands +swollen. Feet: Distal  phalanges of bilateral toes III-IV with darkened/discolored toenails. Neuro:  CN II-XII grossly normal.    Results for orders placed or performed in visit on 10/06/23  Sedimentation rate  Result Value Ref Range   Sed Rate 11 0 - 20 mm/hr  C-reactive protein  Result Value Ref Range   CRP <1.0 0.5 - 20.0 mg/dL  Antinuclear Antib (ANA)  Result Value Ref Range   Anti Nuclear Antibody (ANA) POSITIVE (A) NEGATIVE  Rheumatoid Factor  Result Value Ref Range   Rheumatoid fact SerPl-aCnc 10 <14 IU/mL  CBC  Result Value Ref Range   WBC 5.2 4.0 - 10.5 K/uL   RBC 3.97 3.87 - 5.11 Mil/uL   Platelets 281.0 150.0 - 400.0 K/uL   Hemoglobin 12.3 12.0 - 15.0 g/dL   HCT 63.5 63.9 - 53.9 %   MCV 91.8 78.0 - 100.0 fl   MCHC 33.7 30.0 - 36.0 g/dL   RDW 87.7 88.4 - 84.4 %  Comp Met (CMET)  Result Value Ref Range   Sodium 140 135 - 145 mEq/L   Potassium 3.8 3.5 - 5.1 mEq/L   Chloride 101 96 - 112 mEq/L   CO2 32 19 - 32 mEq/L   Glucose, Bld 100 (Jackson) 70 - 99 mg/dL   BUN 7 6 - 23 mg/dL   Creatinine, Ser 9.44 0.40 - 1.20 mg/dL   Total Bilirubin 0.4 0.2 - 1.2 mg/dL   Alkaline Phosphatase 53 39 -  117 U/L   AST 19 0 - 37 U/L   ALT 12 0 - 35 U/L   Total Protein 7.3 6.0 - 8.3 g/dL   Albumin 4.2 3.5 - 5.2 g/dL   GFR 892.68 >39.99 mL/min   Calcium 8.8 8.4 - 10.5 mg/dL  A87  Result Value Ref Range   Vitamin B-12 >1500 (Jackson) 211 - 911 pg/mL  Magnesium  Result Value Ref Range   Magnesium 2.1 1.5 - 2.5 mg/dL  TSH  Result Value Ref Range   TSH 5.47 0.35 - 5.50 uIU/mL  Vitamin D  (25 hydroxy)  Result Value Ref Range   VITD 20.81 (L) 30.00 - 100.00 ng/mL  Hemoglobin A1c  Result Value Ref Range   Hgb A1c MFr Bld 6.0 4.6 - 6.5 %  Anti-nuclear ab-titer (ANA titer)  Result Value Ref Range   ANA Titer 1 1:40 (Jackson) titer   ANA Pattern 1 Nuclear, Homogeneous (A)         Assessment & Plan:   Problem List Items Addressed This Visit     Positive ANA (antinuclear antibody)   Positive ANA with a  homogeneous pattern- referral sent to Rheumatology for further evaluation.      Vitamin D  deficiency   Last vitamin D  Lab Results  Component Value Date   VD25OH 20.81 (L) 10/06/2023     Start Vitamin D  50,000 units weekly x 12 weeks. In addition, advise taking vitamin D  1,000 units daily over the counter daily. Recheck Vitamin D  3 months.      Other Visit Diagnoses       Arthralgia, unspecified joint    -  Primary   Relevant Medications   predniSONE  (DELTASONE ) 20 MG tablet   Other Relevant Orders   Sedimentation rate (Completed)   C-reactive protein (Completed)   Antinuclear Antib (ANA) (Completed)   Rheumatoid Factor (Completed)   CBC (Completed)   Comp Met (CMET) (Completed)   B12 (Completed)   Magnesium (Completed)   TSH (Completed)   Vitamin D  (25 hydroxy) (Completed)   Hemoglobin A1c (Completed)   DG Hand Complete Right     Bilateral hand pain       Relevant Orders   DG Hand Complete Left     Bilateral foot pain         Elevated Vitamin B12 Decrease Vitamin B12 supplementation to every other day instead of daily.   Xray b/l hands pending Start NSAIDs  400-600 mg every 6-8 hours with food, and use acetaminophen  [phone] mg every 6 hours as needed for additional pain relief. Follow up in 1-2 weeks to review lab results.Return sooner if swelling, redness, or worsening pain develops.  Meds ordered this encounter  Medications   predniSONE  (DELTASONE ) 20 MG tablet    Sig: Take 2 tablets (40 mg total) by mouth daily with breakfast for 5 days.    Dispense:  10 tablet    Refill:  0    Supervising Provider:   DOMENICA BLACKBIRD A [[id number]]    No follow-ups on file.  April Amero L Lorn Butcher, NP

## 2023-10-09 LAB — RHEUMATOID FACTOR: Rheumatoid fact SerPl-aCnc: 10 [IU]/mL (ref <14)

## 2023-10-09 LAB — ANTI-NUCLEAR AB-TITER (ANA TITER): ANA Titer 1: 1:40 {titer} — ABNORMAL HIGH

## 2023-10-09 LAB — ANA: Anti Nuclear Antibody (ANA): POSITIVE — AB

## 2023-10-10 ENCOUNTER — Ambulatory Visit: Payer: Self-pay | Admitting: Student

## 2023-10-10 DIAGNOSIS — M2550 Pain in unspecified joint: Secondary | ICD-10-CM | POA: Insufficient documentation

## 2023-10-10 DIAGNOSIS — E559 Vitamin D deficiency, unspecified: Secondary | ICD-10-CM | POA: Insufficient documentation

## 2023-10-10 DIAGNOSIS — R768 Other specified abnormal immunological findings in serum: Secondary | ICD-10-CM | POA: Insufficient documentation

## 2023-10-10 DIAGNOSIS — M329 Systemic lupus erythematosus, unspecified: Secondary | ICD-10-CM

## 2023-10-10 MED ORDER — VITAMIN D (ERGOCALCIFEROL) 1.25 MG (50000 UNIT) PO CAPS: 50000.0000 [IU] | ORAL_CAPSULE | ORAL | 1 refills | Status: DC

## 2023-10-10 NOTE — Assessment & Plan Note (Signed)
 Last vitamin D  Lab Results  Component Value Date   VD25OH 20.81 (L) 10/06/2023     Start Vitamin D  50,000 units weekly x 12 weeks. In addition, advise taking vitamin D  1,000 units daily over the counter daily. Recheck Vitamin D  3 months.

## 2023-10-10 NOTE — Assessment & Plan Note (Signed)
 Positive ANA with a homogeneous pattern- referral sent to Rheumatology for further evaluation.

## 2023-10-11 MED ORDER — LEVOTHYROXINE SODIUM 25 MCG PO TABS: 25.0000 ug | ORAL_TABLET | Freq: Every day | ORAL | 1 refills | Status: AC

## 2023-10-17 NOTE — Assessment & Plan Note (Signed)
 Supplement and Monitor

## 2023-10-17 NOTE — Assessment & Plan Note (Signed)
 Prednisone  taper pt reports symptoms improved. She is scheduled to FU with Rheumatolgoy next month.  Advise  NSAIDs as needed  400-600 mg every 6-8 hours with food, and use acetaminophen  [phone] mg every 6 hours as needed for additional pain relief.  Can use topicals such as BioFreeze, or Voltaren  gel prn.

## 2023-10-17 NOTE — Progress Notes (Unsigned)
 Subjective:     Patient ID: April Jackson, female    DOB: February 25, 1975, 48 y.o.   MRN: [id number]  No chief complaint on file.   HPI  April Jackson is a 48 yo female presents for FU.  She follows with Cardiology and scheduled to [facility] in October.  She experiences persistent joint pain in b/l hands and b/l feet for several weeks, partially alleviated by vitamin D  supplementation. Topical treatments like Voltaren  gel, capsaicin, Salonpas, and Biofreeze provide temporary relief for one to two hours.   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 She experiences tenderness in her joints, especially when pressed.  She takes vitamin D , 1000 IU daily and 50,000 IU weekly, She has a history of rheumatoid arthritis, confirmed by positive rheumatoid factor and elevated AD levels.    Discussed the use of AI scribe software for clinical note transcription with the patient, who gave verbal consent to proceed.  History of Present Illness          Arthralgia  Pt reports pain and stiffness in multiple joints of both hands and multiple joints in the toes b/l feet. Imaging was performed b/l hands and was WNL Labwork revealed +ANA & ANA Titer and low Vitamin D .  HTN-Follows with Cardiology- Dr. Ammon- [facility]  Atenolol  25 mg daily, hydrochlorothiazide  12.5 mg daily  Hypothyroidism- Levothyroxine  25 mcg daily Has not been taking consistently   Patient denies fever, chills, SOB, CP, palpitations, dyspnea, edema, HA, vision changes, N/V/D, abdominal pain, urinary symptoms, rash, weight changes, and recent illness or hospitalizations.     History of Present Illness              Health Maintenance Due  Topic Date Due   Pneumococcal Vaccine (1 of 2 - PCV) Never done   Hepatitis B Vaccines 19-59 Average Risk (1 of 3 - 19+ 3-dose series) Never done   Colonoscopy  Never done   INFLUENZA  VACCINE  09/15/2023   COVID-19 Vaccine (7 - Pfizer risk 2024-25 season) 10/16/2023    Past Medical History:  Diagnosis Date   Allergy    childhood allergies, hives.    Anemia 04/15/2016   Anxiety and depression 09/21/2018   Back pain 09/28/2014   Back pain affecting pregnancy, antepartum 09/28/2014   Cervical cancer screening 01/27/2015   Menarche at 11 Regular and moderate flow No history of abnormal pap in past No sexual activity G0P0, s/p Nohistory of abnormal MGM No concerns today No gyn surgeries   FH: breast cancer 09/28/2014   Hyperlipidemia, mixed 09/28/2014   Hypertension    Pruritus 09/28/2014   Reactive airway disease 04/15/2016   Seasonal allergies    SVT (supraventricular tachycardia) (HCC)    Thyroid  disease     No past surgical history on file.  Family History  Problem Relation Age of Onset   Diabetes Mother    Hypertension Mother    Hyperlipidemia Mother    Cancer Mother        cervical cancer   Heart disease Father    Pulmonary fibrosis Father        pneumonia   Cancer Sister 50       uterine CA   Hypertension Sister    Hyperlipidemia Sister    Gout Sister    Hypertension Sister    Cholelithiasis Sister    Thyroid  disease Sister    Ovarian cysts Sister  Cancer Sister        breast   Asthma Sister 79   Diabetes Maternal Grandmother    Stroke Paternal Grandmother    Heart disease Paternal Grandfather        MI    Social History   Socioeconomic History   Marital status: Single    Spouse name: Not on file   Number of children: 0   Years of education: Not on file   Highest education level: Not on file  Occupational History   Occupation: DENTIST    Employer: SELF EMPLOYED  Tobacco Use   Smoking status: Never   Smokeless tobacco: Never  Vaping Use   Vaping status: Never Used  Substance and Sexual Activity   Alcohol use: No   Drug use: No   Sexual activity: Never    Birth control/protection: None    Comment: Dentist, no dietary restriction,  lives with sister  Other Topics Concern   Not on file  Social History Narrative   Not on file   Social Drivers of Health   Financial Resource Strain: Not on file  Food Insecurity: Not on file  Transportation Needs: Not on file  Physical Activity: Not on file  Stress: Not on file  Social Connections: Not on file  Intimate Partner Violence: Not on file    Outpatient Medications Prior to Visit  Medication Sig Dispense Refill   ACETAMINOPHEN -BUTALBITAL  50-325 MG TABS Take 1 tablet by mouth 3 (three) times daily as needed. 120 tablet 0   ALPRAZolam  (XANAX ) 0.25 MG tablet Take 1 tablet (0.25 mg total) by mouth daily as needed for anxiety. Do not take until after arrival to office. 2 tablet 0   aspirin 325 MG tablet Take 325 mg by mouth daily.     atenolol  (TENORMIN ) 25 MG tablet Take 1 tablet by mouth once daily 90 tablet 0   budesonide  (RHINOCORT  AQUA) 32 MCG/ACT nasal spray Place 2 sprays into both nostrils daily. 8.6 g 2   diltiazem  (CARDIZEM ) 30 MG tablet TAKE 1 TABLET 2 TIMES DAILY MAY TAKE 1 TAB EVERY 4-6 HRS AS NEEDED FOR SUSTAINED HR >100 BPM. 240 tablet 1   hydrochlorothiazide  (HYDRODIURIL ) 12.5 MG tablet Take 12.5 mg by mouth daily.     hydrocortisone 2.5 % ointment Apply topically 2 (two) times daily.     levalbuterol  (XOPENEX  HFA) 45 MCG/ACT inhaler Inhale 1-2 puffs into the lungs every 6 (six) hours as needed for wheezing. 1 each 5   levothyroxine  (SYNTHROID ) 25 MCG tablet Take 1 tablet (25 mcg total) by mouth daily before breakfast. 90 tablet 1   methocarbamol  (ROBAXIN ) 500 MG tablet Take 1 tablet (500 mg total) by mouth every 8 (eight) hours as needed. 90 tablet 2   mometasone  (ELOCON ) 0.1 % cream Apply topically daily. 50 g 1   montelukast  (SINGULAIR ) 10 MG tablet TAKE 1 TABLET BY MOUTH EVERYDAY AT BEDTIME 90 tablet 1   omeprazole  (PRILOSEC) 40 MG capsule TAKE 1 CAPSULE (40 MG TOTAL) BY MOUTH DAILY. 90 capsule 1   triamcinolone  cream (KENALOG ) 0.1 % APPLY TOPICALLY 2 (TWO)  TIMES DAILY. APPLY TOPICALLY 30 g 3   Vitamin D , Ergocalciferol , (DRISDOL ) 1.25 MG (50000 UNIT) CAPS capsule Take 1 capsule (50,000 Units total) by mouth every 7 (seven) days. 6 capsule 1   No facility-administered medications prior to visit.    Allergies  Allergen Reactions   Amoxicillin Other ([facility] Comments)    Rash   Nitrous Oxide Other ([facility] Comments)  Paradoxical response-got very hyper   Penicillin G Other ([facility] Comments)   Penicillins Other ([facility]co Comments)    Rash   Sodium Chloride Swelling    And increased BP    ROS Panella HPI    Objective:    Physical Exam  General: No acute distress. Awake and conversant.  Eyes: Normal conjunctiva, anicteric. Round symmetric pupils.  ENT: Hearing grossly intact. No nasal discharge.  Neck: Neck is supple. No masses or thyromegaly.  Respiratory: CTAB. Respirations are non-labored. No wheezing.  Skin: Warm. No rashes or ulcers.  Psych: Alert and oriented. Cooperative, Appropriate mood and affect, Normal judgment.  CV: RRR. No murmur. No lower extremity edema.  MSK: Normal ambulation. No clubbing or cyanosis.  Neuro:  CN II-XII grossly normal.    LMP 10/05/2023  Wt Readings from Last 3 Encounters:  10/06/23 135 lb 3.2 oz (61.3 kg)  08/14/23 135 lb (61.2 kg)  11/14/22 126 lb 6.4 oz (57.3 kg)       Assessment & Plan:   Problem List Items Addressed This Visit     Arthralgia   Prednisone  taper pt reports symptoms improved. She is scheduled to FU with Rhe[facility]olg[facility] next month.  Advise  NSAIDs as needed  400-600 mg every 6-8 hours with food, and use acetaminophen  [phone] mg every 6 hours as needed for additional pain relief.       Hyperlipidemia, mixed   Encourage heart healthy diet such as MIND or DASH diet, increase exercise, avoid trans fats, simple carbohydrates and processed foods, consider a krill or fish or flaxseed oil cap daily.   Update lipid panel today.      Hypertension   Well controlled, no changes to meds. Follows  with Cardiology. Encouraged heart healthy diet such as the DASH diet and exercise as tolerated.        Thyroid  disease   On Levothyroxine , continue to monitor       Vitamin D  deficiency   Supplement and Monitor.       Other Visit Diagnoses       ANA positive    -  Primary      Inflammatory polyarthritis (suspected autoimmune etiology) Suspected autoimmune etiology with joint pain and tenderness, particularly in hands. X-rays show no bone attrition; past imaging suggests potential osteoarthritis in shoulder. Pain management prioritized until rheumatology consultation. - Initiated acetaminophen  500 mg twice daily, increase to 1000 mg twice daily if needed. - Continue topical treatments: Voltaren  gel, capsaicin, Salonpas, Biofreeze. - Consider low-dose prednisone  (5 mg) daily if pain persists. - Advise intermittent ibuprofen use with caution for GI side effects. - Reinforced importance of rheumatology appointment in October.  Hypothyroidism - Ensure daily levothyroxine  intake, 30 minutes to an hour before meals or coffee. - Recheck thyroid  levels at next appointment with [facility].  Vitamin D  deficiency Managed with high-dose supplementation. - Continue vitamin D  50,000 IU weekly and 1,000 IU daily. - Recheck vitamin D  levels after 12 weeks.   Patient was educated on the diagnosis, treatment options, potential risks, benefits, and alternatives. All questions were addressed. Patient verbalized understanding and agrees with the plan of care. Will follow up as advised or sooner if symptoms worsen or new concerns arise.   Portions of this note were dictated using DRAGON voice recognition software. Please disregard any errors in transcription.     I am having Aryanah H. Cumba maintain her aspirin, budesonide , mometasone , montelukast , omeprazole , hydrochlorothiazide , hydrocortisone, triamcinolone  cream, diltiazem , ALPRAZolam , levalbuterol , ACETAMINOPHEN -BUTALBITAL , methocarbamol , atenolol ,  Vitamin D  (Ergocalciferol ), and  levothyroxine .  No orders of the defined types were placed in this encounter.

## 2023-10-17 NOTE — Assessment & Plan Note (Signed)
 On Levothyroxine, continue to monitor

## 2023-10-17 NOTE — Assessment & Plan Note (Signed)
 Encourage heart healthy diet such as MIND or DASH diet, increase exercise, avoid trans fats, simple carbohydrates and processed foods, consider a krill or fish or flaxseed oil cap daily.   Update lipid panel today.

## 2023-10-17 NOTE — Assessment & Plan Note (Addendum)
 Well controlled, no changes to meds. Follows with Cardiology. Encouraged heart healthy diet such as the DASH diet and exercise as tolerated.

## 2023-10-20 ENCOUNTER — Ambulatory Visit (INDEPENDENT_AMBULATORY_CARE_PROVIDER_SITE_OTHER): Admitting: Student

## 2023-10-20 ENCOUNTER — Encounter: Payer: Self-pay | Admitting: Student

## 2023-10-20 VITALS — BP 116/70 | HR 64 | Temp 97.9°F | Resp 12 | Ht 61.0 in | Wt 136.8 lb

## 2023-10-20 DIAGNOSIS — E559 Vitamin D deficiency, unspecified: Secondary | ICD-10-CM

## 2023-10-20 DIAGNOSIS — E782 Mixed hyperlipidemia: Secondary | ICD-10-CM | POA: Diagnosis not present

## 2023-10-20 DIAGNOSIS — M2550 Pain in unspecified joint: Secondary | ICD-10-CM

## 2023-10-20 DIAGNOSIS — R768 Other specified abnormal immunological findings in serum: Secondary | ICD-10-CM

## 2023-10-20 DIAGNOSIS — E079 Disorder of thyroid, unspecified: Secondary | ICD-10-CM

## 2023-10-20 DIAGNOSIS — I10 Essential (primary) hypertension: Secondary | ICD-10-CM

## 2023-10-20 MED ORDER — ACETAMINOPHEN 500 MG PO TABS: 500.0000 mg | ORAL_TABLET | Freq: Two times a day (BID) | ORAL | 0 refills | Status: AC

## 2023-10-27 ENCOUNTER — Ambulatory Visit: Payer: Self-pay | Admitting: Student

## 2023-10-27 ENCOUNTER — Other Ambulatory Visit (INDEPENDENT_AMBULATORY_CARE_PROVIDER_SITE_OTHER)

## 2023-10-27 DIAGNOSIS — E782 Mixed hyperlipidemia: Secondary | ICD-10-CM

## 2023-10-27 DIAGNOSIS — H01004 Unspecified blepharitis left upper eyelid: Secondary | ICD-10-CM | POA: Diagnosis not present

## 2023-10-27 DIAGNOSIS — H01001 Unspecified blepharitis right upper eyelid: Secondary | ICD-10-CM | POA: Diagnosis not present

## 2023-10-27 DIAGNOSIS — H5203 Hypermetropia, bilateral: Secondary | ICD-10-CM | POA: Diagnosis not present

## 2023-10-27 LAB — LIPID PANEL
Cholesterol: 225 mg/dL — ABNORMAL HIGH (ref 0–200)
HDL: 75.2 mg/dL (ref >39.00)
LDL Cholesterol: 133 mg/dL — ABNORMAL HIGH (ref 0–99)
NonHDL: 149.94
Total CHOL/HDL Ratio: 3
Triglycerides: 86 mg/dL (ref 0.0–149.0)
VLDL: 17.2 mg/dL (ref 0.0–40.0)

## 2023-12-08 ENCOUNTER — Ambulatory Visit

## 2023-12-08 VITALS — BP 112/74 | HR 64 | Temp 98.2°F | Resp 14 | Ht 60.5 in | Wt 134.0 lb

## 2023-12-08 DIAGNOSIS — R7689 Other specified abnormal immunological findings in serum: Secondary | ICD-10-CM | POA: Diagnosis not present

## 2023-12-08 DIAGNOSIS — M0600 Rheumatoid arthritis without rheumatoid factor, unspecified site: Secondary | ICD-10-CM | POA: Diagnosis not present

## 2023-12-08 DIAGNOSIS — Z79899 Other long term (current) drug therapy: Secondary | ICD-10-CM | POA: Diagnosis not present

## 2023-12-08 MED ORDER — DEXAMETHASONE 1 MG PO TABS: 1.0000 mg | ORAL_TABLET | Freq: Every day | ORAL | 0 refills | Status: AC | PRN

## 2023-12-08 MED ORDER — HYDROXYCHLOROQUINE SULFATE 200 MG PO TABS: ORAL_TABLET | ORAL | 0 refills | Status: DC

## 2023-12-08 NOTE — Progress Notes (Signed)
 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 Patient states she has pain in her right hand. Patient states she has pain in both of her feet on and off. )  Discussed the use of AI scribe software for clinical note transcription with the patient, who gave verbal consent to proceed.  History of Present Illness April Jackson is a 47 year old female who presents with joint pain and a positive ANA test result. She was referred by her PCP for evaluation of possible rheumatoid arthritis.  She has been experiencing joint pain for a long time, which worsened about two months ago, leading to decreased activity levels. 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  She experiences morning stiffness in her hands and feet, lasting about five to ten minutes, which improves with soaking and stretching exercises. Swelling in her hands and feet was noted two months ago, particularly in the palms and near the toes. Her fingers have started to deviate to the side, more pronounced on one side.  She has a history of back issues and sometimes experiences knee pain, but not elbow pain. Her knees are sometimes painful but not swollen. She also reports dry eyes, which have been a long-standing issue since childhood, and she uses Systane drops for relief.  She has a history of low B12 levels and supplements with B12, adjusting the dose based on blood test results. She also takes omeprazole  for acid reflux.  No family history of autoimmune diseases, ulcerative colitis, Crohn's disease, or celiac disease. She does not experience rashes,  mouth sores, patchy hair loss, or dry mouth. She washes her hands frequently due to her profession.    Activities of Daily Living:  Patient reports morning stiffness for 5-10 minutes.   Patient Denies nocturnal pain.  Difficulty dressing/grooming: Denies Difficulty climbing stairs: Denies Difficulty getting out of chair: Denies Difficulty using hands for taps, buttons, cutlery, and/or writing: Reports  Review of Systems  Constitutional:  Negative for fatigue.  HENT:  Negative for mouth sores and mouth dryness.   Eyes:  Positive for dryness.  Respiratory:  Negative for shortness of breath.   Cardiovascular:  Negative for chest pain and palpitations.  Gastrointestinal:  Negative for blood in stool, constipation and diarrhea.  Endocrine: Negative for increased urination.  Genitourinary:  Negative for involuntary urination.  Musculoskeletal:  Positive for joint pain, joint pain, joint swelling, myalgias, morning stiffness, muscle tenderness and myalgias. Negative for gait problem and muscle weakness.  Skin:  Negative for color change, rash, hair loss and sensitivity to sunlight.  Allergic/Immunologic: Negative for susceptible to infections.  Neurological:  Negative for dizziness and headaches.  Hematological:  Negative for swollen glands.  Psychiatric/Behavioral:  Positive for sleep disturbance. Negative for depressed mood. The patient is not nervous/anxious.     PMFS History:  Patient Active Problem List   Diagnosis Date Noted   Positive ANA (antinuclear antibody) 10/10/2023   Vitamin D  deficiency 10/10/2023  Arthralgia 10/10/2023   Right shoulder pain 05/09/2022   Irregular menses 05/09/2021   Acid reflux 06/04/2020   Dermatitis 06/04/2020   Dizziness 07/07/2019   Tinea corporis 09/21/2018   Anxiety and depression 09/21/2018   Tension headache 08/21/2018   Preventative health care 01/15/2017   Acute pain of left shoulder 01/15/2017   Reactive airway disease 04/15/2016    Macrocytic anemia 04/15/2016   Hypertension 07/19/2015   Cervical cancer screening 01/27/2015   Hyperlipidemia, mixed 09/28/2014   Back pain 09/28/2014   FH: breast cancer 09/28/2014   SVT (supraventricular tachycardia)    Thyroid  disease 09/18/2014    Past Medical History:  Diagnosis Date   Allergy    childhood allergies, hives.    Anemia 04/15/2016   Anxiety and depression 09/21/2018   Misdiagnoses per patient   Back pain 09/28/2014   Back pain affecting pregnancy, antepartum 09/28/2014   Cervical cancer screening 01/27/2015   Menarche at 11 Regular and moderate flow No history of abnormal pap in past No sexual activity G0P0, s/p Nohistory of abnormal MGM No concerns today No gyn surgeries   FH: breast cancer 09/28/2014   Hyperlipidemia, mixed 09/28/2014   Hypertension    Pruritus 09/28/2014   Reactive airway disease 04/15/2016   Seasonal allergies    SVT (supraventricular tachycardia)    Thyroid  disease     Family History  Problem Relation Age of Onset   Diabetes Mother    Hypertension Mother    Hyperlipidemia Mother    Cancer Mother        cervical cancer   Heart disease Father    Pulmonary fibrosis Father        pneumonia   Cancer Sister 53       uterine CA   Hypertension Sister    Hyperlipidemia Sister    Gout Sister    Hypertension Sister    Cholelithiasis Sister    Thyroid  disease Sister    Ovarian cysts Sister    Cancer Sister        breast   Asthma Sister 37   Diabetes Maternal Grandmother    Stroke Paternal Grandmother    Heart disease Paternal Grandfather        MI   Past Surgical History:  Procedure Laterality Date   ABLATION     Social History   Tobacco Use   Smoking status: Never    Passive exposure: Never   Smokeless tobacco: Never  Vaping Use   Vaping status: Never Used  Substance Use Topics   Alcohol use: No   Drug use: No   Social History   Social History Narrative   Not on file     Immunization History  Administered  Date(s) Administered   Hepatitis A, Adult 09/27/2006   IPV 09/27/2006   Influenza, Seasonal, Injecte, Preservative Fre 12/03/2022   Influenza,inj,Quad PF,6+ Mos 11/22/2018, 10/11/2019, 12/14/2020   Influenza-Unspecified 11/17/2014   MMR 11/26/1987   OPV 11/26/1987, 01/13/1988   PFIZER Comirnaty(Gray Top)Covid-19 Tri-Sucrose Vaccine 06/19/2020   PFIZER(Purple Top)SARS-COV-2 Vaccination 03/01/2019, 03/22/2019, 12/01/2019   Pfizer Covid-19 Vaccine Bivalent Booster 23yrs & up 01/01/2021   Pfizer(Comirnaty)Fall Seasonal Vaccine 12 years and older 01/08/2023   Td 11/26/1987, 01/13/1988   Tdap 09/27/2006, 01/13/2017   Typhoid Live 09/27/2006     Objective: Vital Signs: BP 112/74 (BP Location: Right Arm, Patient Position: Sitting, Cuff Size: Normal)   Pulse 64   Temp 98.2 F (36.8 C)   Resp 14   Ht 5' 0.5 (1.537 m)  Wt 134 lb (60.8 kg)   LMP 12/07/2023   BMI 25.74 kg/m    Physical Exam Vitals and nursing note reviewed.  HENT:     Head: Normocephalic and atraumatic.     Nose: Nose normal.  Eyes:     Conjunctiva/sclera: Conjunctivae normal.     Pupils: Pupils are equal, round, and reactive to light.  Cardiovascular:     Rate and Rhythm: Normal rate and regular rhythm.     Heart sounds: Normal heart sounds.  Pulmonary:     Effort: Pulmonary effort is normal.     Breath sounds: Normal breath sounds.  Musculoskeletal:     Comments: B/l ulnar deviation  Skin:    General: Skin is warm and dry.  Neurological:     Mental Status: She is alert. Mental status is at baseline.  Psychiatric:        Mood and Affect: Mood normal.        Behavior: Behavior normal.      Musculoskeletal Exam:   CDAI Exam: CDAI Score: -- Patient Global: --; Provider Global: -- Swollen: 3 ; Tender: 3  Joint Exam 12/08/2023      Right  Left  MCP 2  Swollen Tender  Swollen Tender  PIP 4 (finger)  Swollen Tender        Investigation: No additional findings.  Imaging: No results found. I  personally reviewed DG XR b/l hands: based on my personal interpretation, joint space narrowing present b/l MCP's and PIP's. No erosions.   Recent Labs: Lab Results  Component Value Date   WBC 4.6 12/08/2023   HGB 12.4 12/08/2023   PLT 333 12/08/2023   NA 140 10/06/2023   K 3.8 10/06/2023   CL 101 10/06/2023   CO2 32 10/06/2023   GLUCOSE 100 (H) 10/06/2023   BUN 7 10/06/2023   CREATININE 0.58 12/08/2023   BILITOT 0.4 10/06/2023   ALKPHOS 53 10/06/2023   AST 24 12/08/2023   ALT 22 12/08/2023   PROT 7.3 10/06/2023   ALBUMIN 4.2 10/06/2023   CALCIUM 8.8 10/06/2023   GFRAA >60 09/08/2015    Speciality Comments: No specialty comments available.  Procedures:  No procedures performed Allergies: Amoxicillin, Nitrous oxide, Penicillin g, Penicillins, and Sodium chloride   Assessment / Plan:     Visit Diagnoses:   Seronegative rheumatoid arthritis (HCC) Patient with joint pain and swelling of b/l hands and feet for >6 weeks w/ XR b/l hands showing joint space narrowing of MCP's and PIP's with deformities (ulnar deviation) and synovitis present on exam. Given negative RF, ESR, and CRP, patient's symptoms are most consistent with a seronegative rheumatoid arthritis. Will check CCP at next appointment.  Patient reports improvement with Decadron (she had some left over from an oral procedure); denies any side effects like she gets with oral NSAIDS or prednisone . Will provide patient with decadron 1mg  (equivalent to prednisone  6.7mg ). Advised patient to take 1/2 tablet (equivalent to 3.3mg  prednisone ) as needed for pain symptoms given she cannot toelrate alternatives. Discussed steroid side effects including weight gain, decreased bone density/fractures, risk for infection, avascular necrosis, hypertension, hyperglycemia, dyspepsia/gastric ulcers, fluid retention, weakness, bruising, acne, cataracts, insomnia, mood problems. Patient also instructed to take this medication with food, and not to  take this medication with NSAIDs such as Aleve  or Ibuprofen.    After discussion with patient, will initiate HCQ 200mg  BID M-F. Discussed with patient that Hydroxychloroquine typically is very well tolerated. The most common side effects are nausea and diarrhea, which  often improve with time. Less common side effects include rash, hair changes, and muscle weakness. Rarely, hydroxychloroquine can lead to a pigmented retinopathy, cardiomyopathy, myopathy, arrhythmias due to prolonged QTc.  Discussed with patient the need for annual eye exams. Discussed recommendation is for a baseline eye exam within the first year of use, then annually after 5 years of starting HCQ.   Patient was given handout on Plaquenil and encouraged to ask questions.    Positive ANA (antinuclear antibody)  Patient with mildly positive ANA.  Patient does not have any features of SLE (no objective malar rash, Raynaud's, alopecia, recurrent cytopenias), or scleroderma (no sclerodactyly, no Raynaud's). Given patient does have dry eyes, will obtain Sjogren syndrome abs. However low suspicion at this time.  As discussed with patient, a positive ANA need not represent presence of clinically active systemic autoimmune disease.  Can be positive in the normal population, autoimmune thyroid  disease (Graves' disease, Hashimoto's thyroiditis etc.), or infection. ANA can be positive in the healthy population at the following rates: ANA 1:40: 20%-30%, ANA 1:80: 10%-15%, ANA 1:160: 5%, ANA 1:320: 3% positive, healthy relative of an SLE patient: 5%-25% positive (usually low titers), and elderly (age >70 years): up to 70% positive at ANA titer 1:40.   Patient does have a hx of indeterminate intrinsic factor abs, which could also explain this positive test result.    High risk medication use  Starting patient on HCQ for seronegative RA. Will obtain baseline toxicity labs (CBC, Cr, AST, ALT) today, and will continue to monitor q6 months.    Orders: Orders Placed This Encounter  Procedures   CBC   Creatinine, serum   AST   ALT   Sjogren's syndrome antibods(ssa + ssb)   Meds ordered this encounter  Medications   dexamethasone (DECADRON) 1 MG tablet    Sig: Take 1 tablet (1 mg total) by mouth daily as needed.    Dispense:  30 tablet    Refill:  0   hydroxychloroquine (PLAQUENIL) 200 MG tablet    Sig: Take 1 tablet 200 mg BID Monday-Friday    Dispense:  120 tablet    Refill:  0    I personally spent a total of 60 minutes in the care of the patient today including preparing to [place] the patient, getting/reviewing separately obtained history, performing a medically appropriate exam/evaluation, counseling and educating, placing orders, and documenting clinical information in the EHR.   Follow-Up Instructions: Return in about 3 months (around 03/09/2024).   Asberry Claw, DO

## 2023-12-09 LAB — AST: AST: 24 U/L (ref 10–35)

## 2023-12-09 LAB — CBC
HCT: 38.6 % (ref 35.0–45.0)
Hemoglobin: 12.4 g/dL (ref 11.7–15.5)
MCH: 30.9 pg (ref 27.0–33.0)
MCHC: 32.1 g/dL (ref 32.0–36.0)
MCV: 96.3 fL (ref 80.0–100.0)
MPV: 9.8 fL (ref 7.5–12.5)
Platelets: 333 Thousand/uL (ref 140–400)
RBC: 4.01 Million/uL (ref 3.80–5.10)
RDW: 11.8 % (ref 11.0–15.0)
WBC: 4.6 Thousand/uL (ref 3.8–10.8)

## 2023-12-09 LAB — ALT: ALT: 22 U/L (ref 6–29)

## 2023-12-09 LAB — SJOGREN'S SYNDROME ANTIBODS(SSA + SSB)
SSA (Ro) (ENA) Antibody, IgG: <1 AI
SSB (La) (ENA) Antibody, IgG: <1 AI

## 2023-12-09 LAB — CREATININE, SERUM: Creat: 0.58 mg/dL (ref 0.50–1.03)

## 2023-12-11 ENCOUNTER — Ambulatory Visit: Payer: Self-pay

## 2023-12-18 DIAGNOSIS — Z79899 Other long term (current) drug therapy: Secondary | ICD-10-CM | POA: Diagnosis not present

## 2023-12-22 ENCOUNTER — Other Ambulatory Visit: Payer: Self-pay | Admitting: Family Medicine

## 2023-12-22 DIAGNOSIS — E782 Mixed hyperlipidemia: Secondary | ICD-10-CM

## 2023-12-27 ENCOUNTER — Other Ambulatory Visit: Payer: Self-pay | Admitting: Student

## 2023-12-27 DIAGNOSIS — E559 Vitamin D deficiency, unspecified: Secondary | ICD-10-CM

## 2024-01-24 NOTE — Progress Notes (Signed)
 Subjective:    Patient ID: April Jackson, female    DOB: [birth year], 48 y.o.   MRN: [id number]  No chief complaint on file.   HPI Discussed the use of AI scribe software for clinical note transcription with the patient, who gave verbal consent to proceed.  History of Present Illness April Jackson is a 48 year old female who presents for a routine follow-up visit.  She is currently on a regimen of high-dose vitamin D  due to previously low levels, taking 50,000 units once a week and 1,000 units every other day. She is awaiting a recheck of her vitamin D  levels today. There have been no recent emergency room visits or significant illnesses.  She is taking hydroxychloroquine  for rheumatoid arthritis. Initially, she experienced palpitations and a racing heart, which led her to reduce the dose to 100 mg daily for five days a week, with breaks in between. The medication decreases her appetite, resulting in a weight loss of three pounds. She has no nausea or vomiting but notes a reduced portion size when eating.  No recent breathing troubles or allergies. She is due for a mammogram and plans to schedule it for February. She is not currently in need of any medication refills.    Past Medical History:  Diagnosis Date   Allergy    childhood allergies, hives.    Anemia 04/15/2016   Anxiety and depression 09/21/2018   Misdiagnoses per patient   Back pain 09/28/2014   Back pain affecting pregnancy, antepartum 09/28/2014   Cervical cancer screening 01/27/2015   Menarche at 11 Regular and moderate flow No history of abnormal pap in past No sexual activity G0P0, s/p Nohistory of abnormal MGM No concerns today No gyn surgeries   FH: breast cancer 09/28/2014   Hyperlipidemia, mixed 09/28/2014   Hypertension    Pruritus 09/28/2014   Reactive airway disease 04/15/2016   Seasonal allergies    SVT (supraventricular tachycardia)    Thyroid  disease     Past Surgical History:  Procedure Laterality Date    ABLATION      Family History  Problem Relation Age of Onset   Diabetes Mother    Hypertension Mother    Hyperlipidemia Mother    Cancer Mother        cervical cancer   Heart disease Father    Pulmonary fibrosis Father        pneumonia   Cancer Sister 47       uterine CA   Hypertension Sister    Hyperlipidemia Sister    Gout Sister    Hypertension Sister    Cholelithiasis Sister    Thyroid  disease Sister    Ovarian cysts Sister    Cancer Sister        breast   Asthma Sister 37   Diabetes Maternal Grandmother    Stroke Paternal Grandmother    Heart disease Paternal Grandfather        MI    Social History   Socioeconomic History   Marital status: Single    Spouse name: Not on file   Number of children: 0   Years of education: Not on file   Highest education level: Not on file  Occupational History   Occupation: DENTIST    Employer: SELF EMPLOYED  Tobacco Use   Smoking status: Never    Passive exposure: Never   Smokeless tobacco: Never  Vaping Use   Vaping status: Never Used  Substance and Sexual Activity   Alcohol  use: No   Drug use: No   Sexual activity: Never    Birth control/protection: None    Comment: Dentist, no dietary restriction, lives with sister  Other Topics Concern   Not on file  Social History Narrative   Not on file   Social Drivers of Health   Financial Resource Strain: Not on file  Food Insecurity: Not on file  Transportation Needs: Not on file  Physical Activity: Not on file  Stress: Not on file  Social Connections: Not on file  Intimate Partner Violence: Not on file    Outpatient Medications Prior to Visit  Medication Sig Dispense Refill   atenolol  (TENORMIN ) 25 MG tablet Take 1 tablet (25 mg total) by mouth daily. 90 tablet 1   acetaminophen  (TYLENOL ) 500 MG tablet Take 1 tablet (500 mg total) by mouth in the morning and at bedtime. 30 tablet 0   ACETAMINOPHEN -BUTALBITAL  50-325 MG TABS Take 1 tablet by mouth 3 (three) times  daily as needed. 120 tablet 0   Ascorbic Acid (VITAMIN C PO) Take by mouth.     aspirin 325 MG tablet Take 325 mg by mouth daily.     budesonide  (RHINOCORT  AQUA) 32 MCG/ACT nasal spray Place 2 sprays into both nostrils daily. 8.6 g 2   CALCIUM MAGNESIUM ZINC PO Take by mouth.     Cyanocobalamin  (VITAMIN B-12 PO) Take by mouth.     diltiazem  (CARDIZEM ) 30 MG tablet TAKE 1 TABLET 2 TIMES DAILY MAY TAKE 1 TAB EVERY 4-6 HRS AS NEEDED FOR SUSTAINED HR >100 BPM. 240 tablet 1   Ferrous Sulfate (IRON PO) Take by mouth.     hydrochlorothiazide  (HYDRODIURIL ) 12.5 MG tablet Take 12.5 mg by mouth daily.     hydrocortisone 2.5 % ointment Apply topically 2 (two) times daily. (Patient taking differently: Apply topically as needed.)     hydroxychloroquine  (PLAQUENIL ) 200 MG tablet Take 1 tablet 200 mg BID Monday-Friday 120 tablet 0   levalbuterol  (XOPENEX  HFA) 45 MCG/ACT inhaler Inhale 1-2 puffs into the lungs every 6 (six) hours as needed for wheezing. 1 each 5   levothyroxine  (SYNTHROID ) 25 MCG tablet Take 1 tablet (25 mcg total) by mouth daily before breakfast. 90 tablet 1   methocarbamol  (ROBAXIN ) 500 MG tablet Take 1 tablet (500 mg total) by mouth every 8 (eight) hours as needed. 90 tablet 2   mometasone  (ELOCON ) 0.1 % cream Apply topically daily. 50 g 1   montelukast  (SINGULAIR ) 10 MG tablet TAKE 1 TABLET BY MOUTH EVERYDAY AT BEDTIME 90 tablet 1   Multiple Vitamins-Minerals (MULTIVITAMIN WITH MINERALS) tablet Take 1 tablet by mouth daily.     omeprazole  (PRILOSEC) 40 MG capsule TAKE 1 CAPSULE (40 MG TOTAL) BY MOUTH DAILY. 90 capsule 1   POTASSIUM PO Take by mouth.     triamcinolone  cream (KENALOG ) 0.1 % APPLY TOPICALLY 2 (TWO) TIMES DAILY. APPLY TOPICALLY 30 g 3   Vitamin D , Ergocalciferol , (DRISDOL ) 1.25 MG (50000 UNIT) CAPS capsule TAKE 1 CAPSULE (50,000 UNITS TOTAL) BY MOUTH EVERY 7 (SEVEN) DAYS 6 capsule 1   No facility-administered medications prior to visit.    Allergies  Allergen Reactions    Amoxicillin Other ([facility] Comments)    Rash   Nitrous Oxide Other ([facility] Comments)    Paradoxical response-got very hyper   Penicillin G Other ([facility] Comments)   Penicillins Other ([facility] Comments)    Rash   Sodium Chloride Swelling    And increased BP    Review of Systems  Constitutional:  Negative for fever and malaise/fatigue.  HENT:  Negative for congestion.   Eyes:  Negative for blurred vision.  Respiratory:  Negative for shortness of breath.   Cardiovascular:  Negative for chest pain, palpitations and leg swelling.  Gastrointestinal:  Negative for abdominal pain, blood in stool and nausea.  Genitourinary:  Negative for dysuria and frequency.  Musculoskeletal:  Negative for falls.  Skin:  Negative for rash.  Neurological:  Negative for dizziness, loss of consciousness and headaches.  Endo/Heme/Allergies:  Negative for environmental allergies.  Psychiatric/Behavioral:  Negative for depression. The patient is not nervous/anxious.        Objective:    Physical Exam Constitutional:      General: She is not in acute distress.    Appearance: Normal appearance. She is well-developed. She is not toxic-appearing.  HENT:     Head: Normocephalic and atraumatic.     Right Ear: External ear normal.     Left Ear: External ear normal.     Nose: Nose normal.  Eyes:     General:        Right eye: No discharge.        Left eye: No discharge.     Conjunctiva/sclera: Conjunctivae normal.  Neck:     Thyroid : No thyromegaly.  Cardiovascular:     Rate and Rhythm: Normal rate and regular rhythm.     Heart sounds: Normal heart sounds. No murmur heard. Pulmonary:     Effort: Pulmonary effort is normal. No respiratory distress.     Breath sounds: Normal breath sounds.  Abdominal:     General: Bowel sounds are normal.     Palpations: Abdomen is soft.     Tenderness: There is no abdominal tenderness. There is no guarding.  Musculoskeletal:        General: Normal range of motion.     Cervical  back: Neck supple.  Lymphadenopathy:     Cervical: No cervical adenopathy.  Skin:    General: Skin is warm and dry.  Neurological:     Mental Status: She is alert and oriented to person, place, and time.  Psychiatric:        Mood and Affect: Mood normal.        Behavior: Behavior normal.        Thought Content: Thought content normal.        Judgment: Judgment normal.    There were no vitals taken for this visit. Wt Readings from Last 3 Encounters:  12/08/23 134 lb (60.8 kg)  10/20/23 136 lb 12.8 oz (62.1 kg)  10/06/23 135 lb 3.2 oz (61.3 kg)    Diabetic Foot Exam - Simple   No data filed    Lab Results  Component Value Date   WBC 4.6 12/08/2023   HGB 12.4 12/08/2023   HCT 38.6 12/08/2023   PLT 333 12/08/2023   GLUCOSE 100 (H) 10/06/2023   CHOL 225 (H) 10/27/2023   TRIG 86.0 10/27/2023   HDL 75.20 10/27/2023   LDLCALC 133 (H) 10/27/2023   ALT 22 12/08/2023   AST 24 12/08/2023   NA 140 10/06/2023   K 3.8 10/06/2023   CL 101 10/06/2023   CREATININE 0.58 12/08/2023   BUN 7 10/06/2023   CO2 32 10/06/2023   TSH 5.47 10/06/2023   HGBA1C 6.0 10/06/2023    Lab Results  Component Value Date   TSH 5.47 10/06/2023   Lab Results  Component Value Date   WBC 4.6 12/08/2023   HGB 12.4  12/08/2023   HCT 38.6 12/08/2023   MCV 96.3 12/08/2023   PLT 333 12/08/2023   Lab Results  Component Value Date   NA 140 10/06/2023   K 3.8 10/06/2023   CO2 32 10/06/2023   GLUCOSE 100 (H) 10/06/2023   BUN 7 10/06/2023   CREATININE 0.58 12/08/2023   BILITOT 0.4 10/06/2023   ALKPHOS 53 10/06/2023   AST 24 12/08/2023   ALT 22 12/08/2023   PROT 7.3 10/06/2023   ALBUMIN 4.2 10/06/2023   CALCIUM 8.8 10/06/2023   ANIONGAP 10 09/08/2015   GFR 107.31 10/06/2023   Lab Results  Component Value Date   CHOL 225 (H) 10/27/2023   Lab Results  Component Value Date   HDL 75.20 10/27/2023   Lab Results  Component Value Date   LDLCALC 133 (H) 10/27/2023   Lab Results  Component  Value Date   TRIG 86.0 10/27/2023   Lab Results  Component Value Date   CHOLHDL 3 10/27/2023   Lab Results  Component Value Date   HGBA1C 6.0 10/06/2023       Assessment & Plan:  Vitamin D  deficiency Assessment & Plan: Supplement and monitor    Thyroid  disease Assessment & Plan: On Levothyroxine , continue to monitor    Hypertension, unspecified type Assessment & Plan: Well controlled, no changes to meds. Encouraged heart healthy diet such as the DASH diet and exercise as tolerated.    Hyperlipidemia, mixed Assessment & Plan: Encourage heart healthy diet such as MIND or DASH diet, increase exercise, avoid trans fats, simple carbohydrates and processed foods, consider a krill or fish or flaxseed oil cap daily.    Gastroesophageal reflux disease, unspecified whether esophagitis present Assessment & Plan: Avoid offending foods, start probiotics. Do not eat large meals in late evening and consider raising head of bed.      Assessment and Plan Assessment & Plan Vitamin D  deficiency Vitamin D  level is low at 20.8 ng/mL (normal range 32-90 ng/mL). Currently on high-dose vitamin D  supplementation. - Rechecked vitamin D  level today. - If vitamin D  remains low, will increase daily dose to 2000-3000 IU. - If vitamin D  normalizes, will increase daily dose to 3000 IU and discontinue high-dose supplementation.  Hypertension No recent issues reported.  Vitamin B12 deficiency Vitamin B12 levels have been fluctuating. Recent labs were checked to monitor levels. - Continue to monitor vitamin B12 levels.  General Health Maintenance Due for mammogram. Discussed shingles vaccine and its benefits, including reduced risk of shingles and dementia. Pneumonia vaccine discussed, but insurance coverage uncertain due to age and health status. - Ordered mammogram for early February. - Recommended shingles vaccine at age 89. - Advised to check with pharmacy regarding pneumonia vaccine  coverage.  Recording duration: 16 minutes     Harlene Horton, MD

## 2024-01-24 NOTE — Assessment & Plan Note (Signed)
Avoid offending foods, start probiotics. Do not eat large meals in late evening and consider raising head of bed.  

## 2024-01-24 NOTE — Assessment & Plan Note (Signed)
 On Levothyroxine, continue to monitor

## 2024-01-24 NOTE — Assessment & Plan Note (Signed)
 Encourage heart healthy diet such as MIND or DASH diet, increase exercise, avoid trans fats, simple carbohydrates and processed foods, consider a krill or fish or flaxseed oil cap daily.

## 2024-01-24 NOTE — Assessment & Plan Note (Signed)
 Supplement and monitor

## 2024-01-24 NOTE — Assessment & Plan Note (Signed)
 Well controlled, no changes to meds. Encouraged heart healthy diet such as the DASH diet and exercise as tolerated.

## 2024-01-25 ENCOUNTER — Ambulatory Visit: Admitting: Family Medicine

## 2024-01-25 VITALS — BP 104/70 | HR 62 | Temp 98.3°F | Ht 61.0 in | Wt 131.6 lb

## 2024-01-25 DIAGNOSIS — K219 Gastro-esophageal reflux disease without esophagitis: Secondary | ICD-10-CM

## 2024-01-25 DIAGNOSIS — E782 Mixed hyperlipidemia: Secondary | ICD-10-CM

## 2024-01-25 DIAGNOSIS — Z1231 Encounter for screening mammogram for malignant neoplasm of breast: Secondary | ICD-10-CM

## 2024-01-25 DIAGNOSIS — I10 Essential (primary) hypertension: Secondary | ICD-10-CM

## 2024-01-25 DIAGNOSIS — E559 Vitamin D deficiency, unspecified: Secondary | ICD-10-CM | POA: Diagnosis not present

## 2024-01-25 DIAGNOSIS — E538 Deficiency of other specified B group vitamins: Secondary | ICD-10-CM

## 2024-01-25 DIAGNOSIS — E079 Disorder of thyroid, unspecified: Secondary | ICD-10-CM

## 2024-01-25 NOTE — Patient Instructions (Signed)
 Hypertension, Adult High blood pressure (hypertension) is when the force of blood pumping through the arteries is too strong. The arteries are the blood vessels that carry blood from the heart throughout the body. Hypertension forces the heart to work harder to pump blood and may cause arteries to become narrow or stiff. Untreated or uncontrolled hypertension can lead to a heart attack, heart failure, a stroke, kidney disease, and other problems. A blood pressure reading consists of a higher number over a lower number. Ideally, your blood pressure should be below 120/80. The first ("top") number is called the systolic pressure. It is a measure of the pressure in your arteries as your heart beats. The second ("bottom") number is called the diastolic pressure. It is a measure of the pressure in your arteries as the heart relaxes. What are the causes? The exact cause of this condition is not known. There are some conditions that result in high blood pressure. What increases the risk? Certain factors may make you more likely to develop high blood pressure. Some of these risk factors are under your control, including: Smoking. Not getting enough exercise or physical activity. Being overweight. Having too much fat, sugar, calories, or salt (sodium) in your diet. Drinking too much alcohol. Other risk factors include: Having a personal history of heart disease, diabetes, high cholesterol, or kidney disease. Stress. Having a family history of high blood pressure and high cholesterol. Having obstructive sleep apnea. Age. The risk increases with age. What are the signs or symptoms? High blood pressure may not cause symptoms. Very high blood pressure (hypertensive crisis) may cause: Headache. Fast or irregular heartbeats (palpitations). Shortness of breath. Nosebleed. Nausea and vomiting. Vision changes. Severe chest pain, dizziness, and seizures. How is this diagnosed? This condition is diagnosed by  measuring your blood pressure while you are seated, with your arm resting on a flat surface, your legs uncrossed, and your feet flat on the floor. The cuff of the blood pressure monitor will be placed directly against the skin of your upper arm at the level of your heart. Blood pressure should be measured at least twice using the same arm. Certain conditions can cause a difference in blood pressure between your right and left arms. If you have a high blood pressure reading during one visit or you have normal blood pressure with other risk factors, you may be asked to: Return on a different day to have your blood pressure checked again. Monitor your blood pressure at home for 1 week or longer. If you are diagnosed with hypertension, you may have other blood or imaging tests to help your health care provider understand your overall risk for other conditions. How is this treated? This condition is treated by making healthy lifestyle changes, such as eating healthy foods, exercising more, and reducing your alcohol intake. You may be referred for counseling on a healthy diet and physical activity. Your health care provider may prescribe medicine if lifestyle changes are not enough to get your blood pressure under control and if: Your systolic blood pressure is above 130. Your diastolic blood pressure is above 80. Your personal target blood pressure may vary depending on your medical conditions, your age, and other factors. Follow these instructions at home: Eating and drinking  Eat a diet that is high in fiber and potassium, and low in sodium, added sugar, and fat. An example of this eating plan is called the DASH diet. DASH stands for Dietary Approaches to Stop Hypertension. To eat this way: Eat  plenty of fresh fruits and vegetables. Try to fill one half of your plate at each meal with fruits and vegetables. Eat whole grains, such as whole-wheat pasta, brown rice, or whole-grain bread. Fill about one  fourth of your plate with whole grains. Eat or drink low-fat dairy products, such as skim milk or low-fat yogurt. Avoid fatty cuts of meat, processed or cured meats, and poultry with skin. Fill about one fourth of your plate with lean proteins, such as fish, chicken without skin, beans, eggs, or tofu. Avoid pre-made and processed foods. These tend to be higher in sodium, added sugar, and fat. Reduce your daily sodium intake. Many people with hypertension should eat less than 1,500 mg of sodium a day. Do not drink alcohol if: Your health care provider tells you not to drink. You are pregnant, may be pregnant, or are planning to become pregnant. If you drink alcohol: Limit how much you have to: 0-1 drink a day for women. 0-2 drinks a day for men. Know how much alcohol is in your drink. In the [place] one drink equals one 12 oz bottle of beer (355 mL), one 5 oz glass of wine (148 mL), or one 1 oz glass of hard liquor (44 mL). Lifestyle  Work with your health care provider to maintain a healthy body weight or to lose weight. Ask what an ideal weight is for you. Get at least 30 minutes of exercise that causes your heart to beat faster (aerobic exercise) most days of the week. Activities may include walking, swimming, or biking. Include exercise to strengthen your muscles (resistance exercise), such as Pilates or lifting weights, as part of your weekly exercise routine. Try to do these types of exercises for 30 minutes at least 3 days a week. Do not use any products that contain nicotine or tobacco. These products include cigarettes, chewing tobacco, and vaping devices, such as e-cigarettes. If you need help quitting, ask your health care provider. Monitor your blood pressure at home as told by your health care provider. Keep all follow-up visits. This is important. Medicines Take over-the-counter and prescription medicines only as told by your health care provider. Follow directions carefully. Blood  pressure medicines must be taken as prescribed. Do not skip doses of blood pressure medicine. Doing this puts you at risk for problems and can make the medicine less effective. Ask your health care provider about side effects or reactions to medicines that you should watch for. Contact a health care provider if you: Think you are having a reaction to a medicine you are taking. Have headaches that keep coming back (recurring). Feel dizzy. Have swelling in your ankles. Have trouble with your vision. Get help right away if you: Develop a severe headache or confusion. Have unusual weakness or numbness. Feel faint. Have severe pain in your chest or abdomen. Vomit repeatedly. Have trouble breathing. These symptoms may be an emergency. Get help right away. Call 911. Do not wait to see if the symptoms will go away. Do not drive yourself to the hospital. Summary Hypertension is when the force of blood pumping through your arteries is too strong. If this condition is not controlled, it may put you at risk for serious complications. Your personal target blood pressure may vary depending on your medical conditions, your age, and other factors. For most people, a normal blood pressure is less than 120/80. Hypertension is treated with lifestyle changes, medicines, or a combination of both. Lifestyle changes include losing weight, eating a healthy,  low-sodium diet, exercising more, and limiting alcohol. This information is not intended to replace advice given to you by your health care provider. Make sure you discuss any questions you have with your health care provider. Document Revised: 12/08/2020 Document Reviewed: 12/08/2020 Elsevier Patient Education  2024 [employer].

## 2024-01-26 LAB — CBC WITH DIFFERENTIAL/PLATELET
Basophils Absolute: 0.1 K/uL (ref 0.0–0.1)
Basophils Relative: 1.2 % (ref 0.0–3.0)
Eosinophils Absolute: 0.1 K/uL (ref 0.0–0.7)
Eosinophils Relative: 2.3 % (ref 0.0–5.0)
HCT: 35.2 % — ABNORMAL LOW (ref 36.0–46.0)
Hemoglobin: 12.2 g/dL (ref 12.0–15.0)
Lymphocytes Relative: 37.7 % (ref 12.0–46.0)
Lymphs Abs: 1.7 K/uL (ref 0.7–4.0)
MCHC: 34.7 g/dL (ref 30.0–36.0)
MCV: 91.8 fl (ref 78.0–100.0)
Monocytes Absolute: 0.3 K/uL (ref 0.1–1.0)
Monocytes Relative: 7.6 % (ref 3.0–12.0)
Neutro Abs: 2.3 K/uL (ref 1.4–7.7)
Neutrophils Relative %: 51.2 % (ref 43.0–77.0)
Platelets: 290 K/uL (ref 150.0–400.0)
RBC: 3.84 Mil/uL — ABNORMAL LOW (ref 3.87–5.11)
RDW: 12.1 % (ref 11.5–15.5)
WBC: 4.5 K/uL (ref 4.0–10.5)

## 2024-01-26 LAB — COMPREHENSIVE METABOLIC PANEL WITH GFR
ALT: 13 U/L (ref 0–35)
AST: 21 U/L (ref 0–37)
Albumin: 4.8 g/dL (ref 3.5–5.2)
Alkaline Phosphatase: 42 U/L (ref 39–117)
BUN: 7 mg/dL (ref 6–23)
CO2: 33 meq/L — ABNORMAL HIGH (ref 19–32)
Calcium: 9.6 mg/dL (ref 8.4–10.5)
Chloride: 97 meq/L (ref 96–112)
Creatinine, Ser: 0.61 mg/dL (ref 0.40–1.20)
GFR: 104.44 mL/min (ref >60.00)
Glucose, Bld: 91 mg/dL (ref 70–99)
Potassium: 3.8 meq/L (ref 3.5–5.1)
Sodium: 139 meq/L (ref 135–145)
Total Bilirubin: 0.5 mg/dL (ref 0.2–1.2)
Total Protein: 7.9 g/dL (ref 6.0–8.3)

## 2024-01-26 LAB — LIPID PANEL
Cholesterol: 213 mg/dL — ABNORMAL HIGH (ref 0–200)
HDL: 81.2 mg/dL (ref >39.00)
LDL Cholesterol: 117 mg/dL — ABNORMAL HIGH (ref 0–99)
NonHDL: 131.98
Total CHOL/HDL Ratio: 3
Triglycerides: 75 mg/dL (ref 0.0–149.0)
VLDL: 15 mg/dL (ref 0.0–40.0)

## 2024-01-26 LAB — VITAMIN B12: Vitamin B-12: >1500 pg/mL — ABNORMAL HIGH (ref 211–911)

## 2024-01-26 LAB — TSH: TSH: 4.74 u[IU]/mL (ref 0.35–5.50)

## 2024-01-26 LAB — VITAMIN D 25 HYDROXY (VIT D DEFICIENCY, FRACTURES): VITD: 40.32 ng/mL (ref 30.00–100.00)

## 2024-01-28 ENCOUNTER — Encounter: Payer: Self-pay | Admitting: Family Medicine

## 2024-01-28 ENCOUNTER — Ambulatory Visit: Payer: Self-pay | Admitting: Family Medicine

## 2024-03-09 ENCOUNTER — Other Ambulatory Visit: Payer: Self-pay

## 2024-03-11 NOTE — Telephone Encounter (Signed)
 Last Fill: 12/08/2023  Eye exam: 12/18/2023 WNL   Labs: 01/25/2024 RBC 3.84 HCT 35.2 CO2 33  Next Visit: 03/15/2024  Last Visit: 12/08/2023  DX: Seronegative rheumatoid arthritis  Current Dose per office note 12/08/2023: HCQ 200mg  BID M-F   Okay to refill Plaquenil ?

## 2024-03-15 ENCOUNTER — Ambulatory Visit

## 2024-03-15 VITALS — BP 114/69 | HR 58 | Temp 97.6°F | Resp 14 | Ht 61.0 in | Wt 131.6 lb

## 2024-03-15 DIAGNOSIS — Z79899 Other long term (current) drug therapy: Secondary | ICD-10-CM

## 2024-03-15 DIAGNOSIS — R7689 Other specified abnormal immunological findings in serum: Secondary | ICD-10-CM

## 2024-03-15 DIAGNOSIS — M0600 Rheumatoid arthritis without rheumatoid factor, unspecified site: Secondary | ICD-10-CM

## 2024-03-15 MED ORDER — HYDROXYCHLOROQUINE SULFATE 200 MG PO TABS: ORAL_TABLET | ORAL | 1 refills | Status: AC

## 2024-03-15 MED ORDER — PREDNISONE 5 MG PO TABS: 5.0000 mg | ORAL_TABLET | Freq: Every day | ORAL | 2 refills | Status: AC | PRN

## 2024-04-19 ENCOUNTER — Other Ambulatory Visit

## 2024-09-05 ENCOUNTER — Encounter: Admitting: Student

## 2024-09-05 ENCOUNTER — Encounter: Admitting: Family Medicine

## 2024-09-20 ENCOUNTER — Ambulatory Visit
